# Patient Record
Sex: Male | Born: 1960 | Race: White | Hispanic: No | Smoking: Current every day smoker
Health system: Southern US, Community
[De-identification: ages and names within clinical notes are randomized; demographics above are authoritative.]

## PROBLEM LIST (undated history)

## (undated) DIAGNOSIS — T7840XA Allergy, unspecified, initial encounter: Secondary | ICD-10-CM

## (undated) DIAGNOSIS — K219 Gastro-esophageal reflux disease without esophagitis: Secondary | ICD-10-CM

## (undated) DIAGNOSIS — M87051 Idiopathic aseptic necrosis of right femur: Secondary | ICD-10-CM

## (undated) DIAGNOSIS — M1990 Unspecified osteoarthritis, unspecified site: Secondary | ICD-10-CM

## (undated) DIAGNOSIS — F419 Anxiety disorder, unspecified: Secondary | ICD-10-CM

## (undated) DIAGNOSIS — R0600 Dyspnea, unspecified: Secondary | ICD-10-CM

## (undated) DIAGNOSIS — F32A Depression, unspecified: Secondary | ICD-10-CM

## (undated) DIAGNOSIS — R51 Headache: Secondary | ICD-10-CM

## (undated) DIAGNOSIS — Z95810 Presence of automatic (implantable) cardiac defibrillator: Secondary | ICD-10-CM

## (undated) DIAGNOSIS — F1010 Alcohol abuse, uncomplicated: Secondary | ICD-10-CM

## (undated) DIAGNOSIS — E785 Hyperlipidemia, unspecified: Secondary | ICD-10-CM

## (undated) DIAGNOSIS — K7460 Unspecified cirrhosis of liver: Secondary | ICD-10-CM

## (undated) DIAGNOSIS — M109 Gout, unspecified: Secondary | ICD-10-CM

## (undated) DIAGNOSIS — I509 Heart failure, unspecified: Secondary | ICD-10-CM

## (undated) DIAGNOSIS — F329 Major depressive disorder, single episode, unspecified: Secondary | ICD-10-CM

## (undated) DIAGNOSIS — K2990 Gastroduodenitis, unspecified, without bleeding: Secondary | ICD-10-CM

## (undated) DIAGNOSIS — K759 Inflammatory liver disease, unspecified: Secondary | ICD-10-CM

## (undated) DIAGNOSIS — I10 Essential (primary) hypertension: Secondary | ICD-10-CM

## (undated) DIAGNOSIS — L988 Other specified disorders of the skin and subcutaneous tissue: Secondary | ICD-10-CM

## (undated) DIAGNOSIS — I8500 Esophageal varices without bleeding: Secondary | ICD-10-CM

## (undated) DIAGNOSIS — R519 Headache, unspecified: Secondary | ICD-10-CM

## (undated) HISTORY — DX: Depression, unspecified: F32.A

## (undated) HISTORY — DX: Hyperlipidemia, unspecified: E78.5

## (undated) HISTORY — DX: Gastro-esophageal reflux disease without esophagitis: K21.9

## (undated) HISTORY — DX: Allergy, unspecified, initial encounter: T78.40XA

## (undated) HISTORY — DX: Essential (primary) hypertension: I10

## (undated) HISTORY — DX: Major depressive disorder, single episode, unspecified: F32.9

---

## 1984-10-26 HISTORY — PX: TENDON REPAIR: SHX5111

## 1993-10-26 HISTORY — PX: TUMOR REMOVAL: SHX12

## 2013-10-26 HISTORY — PX: EP IMPLANTABLE DEVICE: SHX172B

## 2013-10-26 HISTORY — PX: OTHER SURGICAL HISTORY: SHX169

## 2015-08-27 HISTORY — PX: COLONOSCOPY: SHX174

## 2015-12-27 ENCOUNTER — Encounter: Payer: Self-pay | Admitting: Family Medicine

## 2015-12-27 ENCOUNTER — Ambulatory Visit (INDEPENDENT_AMBULATORY_CARE_PROVIDER_SITE_OTHER): Payer: Medicaid Other | Admitting: Family Medicine

## 2015-12-27 VITALS — BP 111/65 | HR 78 | Temp 97.3°F | Ht 68.0 in | Wt 277.0 lb

## 2015-12-27 DIAGNOSIS — I10 Essential (primary) hypertension: Secondary | ICD-10-CM | POA: Diagnosis not present

## 2015-12-27 DIAGNOSIS — K439 Ventral hernia without obstruction or gangrene: Secondary | ICD-10-CM

## 2015-12-27 DIAGNOSIS — E785 Hyperlipidemia, unspecified: Secondary | ICD-10-CM | POA: Diagnosis not present

## 2015-12-27 DIAGNOSIS — Z95810 Presence of automatic (implantable) cardiac defibrillator: Secondary | ICD-10-CM | POA: Diagnosis not present

## 2015-12-27 DIAGNOSIS — M87051 Idiopathic aseptic necrosis of right femur: Secondary | ICD-10-CM

## 2015-12-27 MED ORDER — LISINOPRIL 20 MG PO TABS: 20.0000 mg | ORAL_TABLET | Freq: Every day | ORAL | Status: DC

## 2015-12-27 NOTE — Progress Notes (Signed)
BP 111/65 mmHg  Pulse 78  Temp(Src) 97.3 F (36.3 C) (Oral)  Ht  (1.727 m)  Wt 277 lb (125.646 kg)  BMI 42.13 kg/m2   Subjective:    Patient ID: Russell Trujillo, male    DOB: [birth year], 55 y.o.   MRN: [id number]  HPI: Russell Trujillo is a 55 y.o. male presenting on 12/27/2015 for Establish Care   HPI Hypertension Patient presents today as a new patient to our practice. He just moved here about 2 weeks ago. He moved over from just [place] of [place]. His blood pressure today is 111/65. He is on lisinopril and metoprolol for this. Patient denies headaches, blurred vision, chest pains, shortness of breath, or weakness. Denies any side effects from medication and is content with current medication.   Hyperlipidemia Patient has a history of diagnosis of hyperlipidemia but has not on any medication for currently. There is concerns about possible liver issues he says just recently diagnosed and a CT scan may have shown some issues with his bladder and he thinks that's why they stopped his cholesterol medication. We will request the records of both and reevaluate at that time whether he needs repeat labs or medication.  AICD Patient has been told that he had an enlarged heart and possibility for a fatal arrhythmia from his previous cardiologist and they put in on AICD. He needs a new electrophysiologist and cardiologist here.  Ventral hernia Patient has a large ventral hernia about 4-5 cm in diameter just above his umbilicus. The hernia is reducible. He does feel like he has a significant amount of fluid in the abdomen which is creating a lot of pressure and outpouching of the hernia. We will send to Gen. surgery because of the hernia and the possibility of what he said on the CT scan that he may have some kind of fistula between his bladder and his rectum. We will request records of the CT scan in the meantime.  Relevant past medical, surgical, family and social history reviewed and updated as  indicated. Interim medical history since our last visit reviewed. Allergies and medications reviewed and updated.  Review of Systems  Constitutional: Negative for fever and chills.  HENT: Negative for ear discharge and ear pain.   Eyes: Negative for discharge and visual disturbance.  Respiratory: Negative for cough, shortness of breath and wheezing.   Cardiovascular: Negative for chest pain and leg swelling.  Gastrointestinal: Positive for abdominal distention. Negative for nausea, vomiting, abdominal pain, diarrhea, constipation and blood in stool.  Genitourinary: Negative for difficulty urinating.  Musculoskeletal: Negative for back pain and gait problem.  Skin: Negative for rash.  Neurological: Negative for dizziness, syncope, light-headedness and headaches.  All other systems reviewed and are negative.   Per HPI unless specifically indicated above  Social History   Social History  . Marital Status: Legally Separated    Spouse Name: N/A  . Number of Children: N/A  . Years of Education: N/A   Occupational History  . Not on file.   Social History Main Topics  . Smoking status: Current Every Day Smoker -- 1.50 packs/day for 40 years    Types: Cigarettes  . Smokeless tobacco: Never Used  . Alcohol Use: 2.4 oz/week    4 Shots of liquor per week  . Drug Use: No  . Sexual Activity: No   Other Topics Concern  . Not on file   Social History Narrative  . No narrative on file    Past Surgical  History  Procedure Laterality Date  . Tendon repair  1986    right hand  . Heart catherization  2015    no blockages  . Ep implantable device  2015  . 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 Cancer Mother   . Depression Mother   . Diabetes Mother   . Hearing loss Mother   . Hyperlipidemia Mother   . Hypertension Mother   . Mental illness Mother   . Vision loss Mother   . Alcohol abuse Father   . Arthritis Father   . Early  death Father   . Hyperlipidemia Father   . Hypertension Father   . Vision loss Father       Medication List       This list is accurate as of: 12/27/15  9:10 AM.  Always use your most recent med list.               aspirin 325 MG tablet  Take 325 mg by mouth daily.     diphenhydrAMINE 25 MG tablet  Commonly known as:  SOMINEX  Take 25 mg by mouth at bedtime as needed for sleep.     FLUoxetine 10 MG capsule  Commonly known as:  PROZAC  Take 10 mg by mouth daily.     ibuprofen 800 MG tablet  Commonly known as:  ADVIL,MOTRIN  Take 800 mg by mouth every 8 (eight) hours as needed.     lisinopril 20 MG tablet  Commonly known as:  PRINIVIL,ZESTRIL  Take 1 tablet (20 mg total) by mouth daily.     metoprolol succinate 25 MG 24 hr tablet  Commonly known as:  TOPROL-XL  Take 25 mg by mouth daily.           Objective:    BP 111/65 mmHg  Pulse 78  Temp(Src) 97.3 F (36.3 C) (Oral)  Ht  (1.727 m)  Wt 277 lb (125.646 kg)  BMI 42.13 kg/m2  Wt Readings from Last 3 Encounters:  12/27/15 277 lb (125.646 kg)    Physical Exam  Constitutional: He is oriented to person, place, and time. He appears well-developed and well-nourished. No distress.  Eyes: Conjunctivae and EOM are normal. Pupils are equal, round, and reactive to light. Right eye exhibits no discharge. No scleral icterus.  Neck: Neck supple. No thyromegaly present.  Cardiovascular: Normal rate, regular rhythm, normal heart sounds and intact distal pulses.   No murmur heard. Pulmonary/Chest: Effort normal and breath sounds normal. No respiratory distress. He has no wheezes.  Musculoskeletal: Normal range of motion. He exhibits no edema.  Lymphadenopathy:    He has no cervical adenopathy.  Neurological: He is alert and oriented to person, place, and time. Coordination normal.  Skin: Skin is warm and dry. No rash noted. He is not diaphoretic.  Psychiatric: He has a normal mood and affect. His behavior is normal.   Nursing note and vitals reviewed.   No results found for this or any previous visit.    Assessment & Plan:   Problem List Items Addressed This Visit      Cardiovascular and Mediastinum   Essential hypertension, benign   Relevant Medications   aspirin 325 MG tablet   metoprolol succinate (TOPROL-XL) 25 MG 24 hr tablet   lisinopril (PRINIVIL,ZESTRIL) 20 MG tablet     Other   Hyperlipidemia LDL goal <130   Relevant Medications  aspirin 325 MG tablet   metoprolol succinate (TOPROL-XL) 25 MG 24 hr tablet   lisinopril (PRINIVIL,ZESTRIL) 20 MG tablet   AICD (automatic cardioverter/defibrillator) present   Relevant Orders   Ambulatory referral to Cardiology   Ambulatory referral to Cardiac Electrophysiology   Ventral hernia   Relevant Orders   Ambulatory referral to General Surgery    Other Visit Diagnoses    Avascular necrosis of bone of hip, right (HCC)    -  Primary        Follow up plan: Return in about 4 weeks (around 01/24/2016), or if symptoms worsen or fail to improve, for htn recheck and labs.  Arville CareJoshua Jolyssa Oplinger, MD [facility] 12/27/2015, 9:10 AM

## 2016-01-02 ENCOUNTER — Other Ambulatory Visit: Payer: Self-pay | Admitting: Pharmacist

## 2016-01-02 MED ORDER — METOPROLOL SUCCINATE ER 25 MG PO TB24: 25.0000 mg | ORAL_TABLET | Freq: Every day | ORAL | Status: DC

## 2016-01-08 ENCOUNTER — Telehealth: Payer: Self-pay

## 2016-01-08 NOTE — Telephone Encounter (Signed)
x

## 2016-01-14 ENCOUNTER — Other Ambulatory Visit: Payer: Self-pay | Admitting: General Surgery

## 2016-01-14 ENCOUNTER — Telehealth: Payer: Self-pay | Admitting: Family Medicine

## 2016-01-14 DIAGNOSIS — K604 Rectal fistula: Secondary | ICD-10-CM

## 2016-01-16 NOTE — Telephone Encounter (Signed)
Attempted to call Dr. Derrell Lollingamirez, his office that he will give me a call back when he is back in. Russell CareJoshua Davinder Haff, MD [facility] 01/16/2016, 2:33 PM

## 2016-01-19 ENCOUNTER — Encounter (HOSPITAL_COMMUNITY): Payer: Self-pay | Admitting: Emergency Medicine

## 2016-01-19 ENCOUNTER — Emergency Department (HOSPITAL_COMMUNITY)
Admission: EM | Admit: 2016-01-19 | Discharge: 2016-01-19 | Disposition: A | Discharge status: Home / Self Care | Payer: Medicaid Other | Attending: Emergency Medicine | Admitting: Emergency Medicine

## 2016-01-19 DIAGNOSIS — F329 Major depressive disorder, single episode, unspecified: Secondary | ICD-10-CM | POA: Insufficient documentation

## 2016-01-19 DIAGNOSIS — Z791 Long term (current) use of non-steroidal anti-inflammatories (NSAID): Secondary | ICD-10-CM | POA: Diagnosis not present

## 2016-01-19 DIAGNOSIS — Z79899 Other long term (current) drug therapy: Secondary | ICD-10-CM | POA: Diagnosis not present

## 2016-01-19 DIAGNOSIS — I110 Hypertensive heart disease with heart failure: Secondary | ICD-10-CM | POA: Diagnosis not present

## 2016-01-19 DIAGNOSIS — F17210 Nicotine dependence, cigarettes, uncomplicated: Secondary | ICD-10-CM | POA: Insufficient documentation

## 2016-01-19 DIAGNOSIS — I509 Heart failure, unspecified: Secondary | ICD-10-CM | POA: Insufficient documentation

## 2016-01-19 DIAGNOSIS — Z5321 Procedure and treatment not carried out due to patient leaving prior to being seen by health care provider: Secondary | ICD-10-CM | POA: Insufficient documentation

## 2016-01-19 DIAGNOSIS — Z7982 Long term (current) use of aspirin: Secondary | ICD-10-CM | POA: Diagnosis not present

## 2016-01-19 DIAGNOSIS — M7989 Other specified soft tissue disorders: Secondary | ICD-10-CM | POA: Insufficient documentation

## 2016-01-19 NOTE — ED Notes (Signed)
Dr. Patria Maneampos in to assess patient, patient not in room, unable to locate patient.

## 2016-01-19 NOTE — ED Notes (Signed)
Pt states he has been having an increase in leg swelling over the past few days.

## 2016-01-24 ENCOUNTER — Ambulatory Visit
Admission: RE | Admit: 2016-01-24 | Discharge: 2016-01-24 | Disposition: A | Discharge status: Home / Self Care | Payer: Medicaid Other | Source: Ambulatory Visit | Attending: General Surgery | Admitting: General Surgery

## 2016-01-24 DIAGNOSIS — K604 Rectal fistula: Secondary | ICD-10-CM

## 2016-01-24 MED ORDER — IOPAMIDOL (ISOVUE-300) INJECTION 61%
125.0000 mL | Freq: Once | INTRAVENOUS | Status: AC | PRN
  Administered 2016-01-24: 125 mL via INTRAVENOUS

## 2016-01-28 ENCOUNTER — Ambulatory Visit (HOSPITAL_COMMUNITY)
Admission: RE | Admit: 2016-01-28 | Discharge: 2016-01-28 | Disposition: A | Discharge status: Home / Self Care | Payer: Medicaid Other | Source: Ambulatory Visit | Attending: Family Medicine | Admitting: Family Medicine

## 2016-01-28 ENCOUNTER — Encounter: Payer: Self-pay | Admitting: Family Medicine

## 2016-01-28 ENCOUNTER — Encounter (HOSPITAL_COMMUNITY): Payer: Self-pay

## 2016-01-28 ENCOUNTER — Ambulatory Visit (INDEPENDENT_AMBULATORY_CARE_PROVIDER_SITE_OTHER): Payer: Medicaid Other | Admitting: Family Medicine

## 2016-01-28 VITALS — BP 113/74 | HR 80 | Temp 96.8°F | Ht 68.0 in | Wt 310.2 lb

## 2016-01-28 DIAGNOSIS — R188 Other ascites: Secondary | ICD-10-CM

## 2016-01-28 DIAGNOSIS — I10 Essential (primary) hypertension: Secondary | ICD-10-CM

## 2016-01-28 DIAGNOSIS — N321 Vesicointestinal fistula: Secondary | ICD-10-CM

## 2016-01-28 DIAGNOSIS — E785 Hyperlipidemia, unspecified: Secondary | ICD-10-CM | POA: Diagnosis not present

## 2016-01-28 DIAGNOSIS — L98491 Non-pressure chronic ulcer of skin of other sites limited to breakdown of skin: Secondary | ICD-10-CM | POA: Diagnosis not present

## 2016-01-28 DIAGNOSIS — K7031 Alcoholic cirrhosis of liver with ascites: Secondary | ICD-10-CM

## 2016-01-28 DIAGNOSIS — K7460 Unspecified cirrhosis of liver: Secondary | ICD-10-CM | POA: Insufficient documentation

## 2016-01-28 DIAGNOSIS — K5732 Diverticulitis of large intestine without perforation or abscess without bleeding: Secondary | ICD-10-CM

## 2016-01-28 MED ORDER — FUROSEMIDE 40 MG PO TABS: 40.0000 mg | ORAL_TABLET | Freq: Every day | ORAL | Status: DC

## 2016-01-28 MED ORDER — METRONIDAZOLE 500 MG PO TABS
500.0000 mg | ORAL_TABLET | Freq: Two times a day (BID) | ORAL | Status: DC
Start: 2016-01-28 — End: 2016-03-02

## 2016-01-28 MED ORDER — CIPROFLOXACIN HCL 500 MG PO TABS: 500.0000 mg | ORAL_TABLET | Freq: Two times a day (BID) | ORAL | Status: DC

## 2016-01-28 NOTE — Procedures (Signed)
PreOperative Dx: Cirrhosis, ascites Postoperative Dx: Cirrhosis, ascites Procedure:   US guided paracentesis Radiologist:  Tyron RussellBoles Anesthesia:  10 ml of1% lidocaine Specimen:  4L ml of amber ascitic fluid EBL:   < 1 ml Complications: None

## 2016-01-28 NOTE — Progress Notes (Signed)
Paracentesis complete no signs of distress. 4000 ml amber colored ascites removed.  

## 2016-01-28 NOTE — Progress Notes (Signed)
BP 113/74 mmHg  Pulse 80  Temp(Src) 96.8 F (36 C) (Oral)  Ht 5' 8"  (1.727 m)  Wt 310 lb 3.2 oz (140.706 kg)  BMI 47.18 kg/m2   Subjective:    Patient ID: Russell Trujillo, male    DOB: 10-22-1961, 55 y.o.   MRN: [id number]  HPI: Russell Trujillo is a 55 y.o. male presenting on 01/28/2016 for Hypertension and Weight Gain   HPI Hypertension recheck Patient comes in today for a hypertension recheck. His blood pressure today is 113/74. He is currently taking lisinopril. Patient denies headaches, blurred vision, chest pains, shortness of breath, or weakness. Denies any side effects from medication and is content with current medication.   Swelling and ascites of the abdomen and lower extremities Patient has been diagnosed with swelling and ascites of the abdomen and lower legs. He has a known hernia on his anterior abdomen in the ventral region and because of the swelling and ascites that has pressed it out and cause some irritation and skin breakdown in that area. 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 They did a CT scan and confirmed that also showed some possible diverticulitis and significant ascites.  Hyperlipidemia recheck Patient has been intolerant of statins previously but is due for a cholesterol recheck today. We will see where he is up.   Relevant past medical, surgical, family and social history reviewed and updated as indicated. Interim medical history since our last visit reviewed. Allergies and medications reviewed and updated.  Review of Systems  Constitutional: Negative for fever.  HENT: Negative for congestion, ear discharge and ear pain.   Eyes: Negative for discharge and visual disturbance.  Respiratory: Negative for cough, chest tightness, shortness of breath and wheezing.   Cardiovascular: Positive for leg swelling. Negative for chest pain.  Gastrointestinal: Negative for abdominal  pain, diarrhea and constipation.  Genitourinary: Negative for difficulty urinating.  Musculoskeletal: Negative for back pain and gait problem.  Skin: Positive for wound. Negative for rash.  Neurological: Negative for dizziness, syncope, light-headedness and headaches.  All other systems reviewed and are negative.   Per HPI unless specifically indicated above     Medication List       This list is accurate as of: 01/28/16  9:19 AM.  Always use your most recent med list.               aspirin 325 MG tablet  Take 325 mg by mouth daily.     ciprofloxacin 500 MG tablet  Commonly known as:  CIPRO  Take 1 tablet (500 mg total) by mouth 2 (two) times daily.     diphenhydrAMINE 25 MG tablet  Commonly known as:  SOMINEX  Take 25 mg by mouth at bedtime as needed for sleep.     FLUoxetine 10 MG capsule  Commonly known as:  PROZAC  Take 10 mg by mouth daily.     furosemide 40 MG tablet  Commonly known as:  LASIX  Take 1 tablet (40 mg total) by mouth daily.     ibuprofen 800 MG tablet  Commonly known as:  ADVIL,MOTRIN  Take 800 mg by mouth every 8 (eight) hours as needed for moderate pain.     lisinopril 20 MG tablet  Commonly known as:  PRINIVIL,ZESTRIL  Take 1 tablet (20 mg total) by mouth daily.     metoprolol succinate 25 MG 24 hr tablet  Commonly  known as:  TOPROL-XL  Take 1 tablet (25 mg total) by mouth daily.     metroNIDAZOLE 500 MG tablet  Commonly known as:  FLAGYL  Take 1 tablet (500 mg total) by mouth 2 (two) times daily.           Objective:    BP 113/74 mmHg  Pulse 80  Temp(Src) 96.8 F (36 C) (Oral)  Ht 5' 8"  (1.727 m)  Wt 310 lb 3.2 oz (140.706 kg)  BMI 47.18 kg/m2  Wt Readings from Last 3 Encounters:  01/28/16 310 lb 3.2 oz (140.706 kg)  01/19/16 299 lb 12.8 oz (135.988 kg)  12/27/15 277 lb (125.646 kg)    Physical Exam  Constitutional: He is oriented to person, place, and time. He appears well-developed and well-nourished. No distress.    Eyes: Conjunctivae and EOM are normal. Pupils are equal, round, and reactive to light. Right eye exhibits no discharge. No scleral icterus.  Neck: Neck supple. No thyromegaly present.  Cardiovascular: Normal rate, regular rhythm, normal heart sounds and intact distal pulses.   No murmur heard. Pulmonary/Chest: Effort normal and breath sounds normal. No respiratory distress. He has no wheezes. He has no rales.  Abdominal: Soft. Bowel sounds are normal. He exhibits distension, fluid wave and ascites. He exhibits no pulsatile midline mass. There is no tenderness. There is no rebound, no guarding and no CVA tenderness. A hernia is present. Hernia confirmed positive in the ventral area (Ventral hernia that is reducible and large, about 4 cm wide).  Musculoskeletal: Normal range of motion. He exhibits no edema.  Lymphadenopathy:    He has no cervical adenopathy.  Neurological: He is alert and oriented to person, place, and time. Coordination normal.  Skin: Skin is warm and dry. Lesion (1.5 cm ulceration over skin on top of ventral hernia. No signs of infection. Drainage is clear to straw-colored. Stage II) noted. No rash noted. He is not diaphoretic.  Psychiatric: He has a normal mood and affect. His behavior is normal.  Vitals reviewed.   No results found for this or any previous visit.    Assessment & Plan:   Problem List Items Addressed This Visit      Cardiovascular and Mediastinum   Essential hypertension, benign - Primary   Relevant Medications   furosemide (LASIX) 40 MG tablet   Other Relevant Orders   CMP14+EGFR (Completed)     Digestive   Hepatic cirrhosis (HCC)   Relevant Medications   furosemide (LASIX) 40 MG tablet   Other Relevant Orders   Ambulatory referral to Gastroenterology   US Paracentesis (Completed)   CMP14+EGFR (Completed)   Vesicorectal fistula   Relevant Medications   ciprofloxacin (CIPRO) 500 MG tablet   metroNIDAZOLE (FLAGYL) 500 MG tablet     Other    Hyperlipidemia LDL goal <130   Relevant Medications   furosemide (LASIX) 40 MG tablet    Other Visit Diagnoses    Ascites        Relevant Medications    furosemide (LASIX) 40 MG tablet    Other Relevant Orders    US Paracentesis (Completed)    Diverticulitis of colon        Relevant Medications    ciprofloxacin (CIPRO) 500 MG tablet    metroNIDAZOLE (FLAGYL) 500 MG tablet    Abdominal wall skin ulcer, limited to breakdown of skin ([place])            Follow up plan: Return in about 1 week (around 02/04/2016), or if  symptoms worsen or fail to improve, for wound care.  Counseling provided for all of the vaccine components Orders Placed This Encounter  Procedures  . US Paracentesis  . CMP14+EGFR  . Ambulatory referral to Gastroenterology    Caryl Pina, MD [facility] 01/28/2016, 9:19 AM

## 2016-01-29 LAB — CMP14+EGFR
A/G RATIO: 1.1 — AB (ref 1.2–2.2)
ALK PHOS: 105 IU/L (ref 39–117)
ALT: 9 IU/L (ref 0–44)
AST: 21 IU/L (ref 0–40)
Albumin: 3.7 g/dL (ref 3.5–5.5)
BILIRUBIN TOTAL: 0.4 mg/dL (ref 0.0–1.2)
BUN/Creatinine Ratio: 13 (ref 9–20)
BUN: 16 mg/dL (ref 6–24)
CHLORIDE: 101 mmol/L (ref 96–106)
CO2: 20 mmol/L (ref 18–29)
Calcium: 8.4 mg/dL — ABNORMAL LOW (ref 8.7–10.2)
Creatinine, Ser: 1.19 mg/dL (ref 0.76–1.27)
GFR calc non Af Amer: 68 mL/min/{1.73_m2} (ref >59)
GFR, EST AFRICAN AMERICAN: 79 mL/min/{1.73_m2} (ref >59)
GLUCOSE: 85 mg/dL (ref 65–99)
Globulin, Total: 3.4 g/dL (ref 1.5–4.5)
POTASSIUM: 5.1 mmol/L (ref 3.5–5.2)
Sodium: 137 mmol/L (ref 134–144)
TOTAL PROTEIN: 7.1 g/dL (ref 6.0–8.5)

## 2016-01-31 ENCOUNTER — Telehealth: Payer: Self-pay | Admitting: Family Medicine

## 2016-01-31 DIAGNOSIS — K7031 Alcoholic cirrhosis of liver with ascites: Secondary | ICD-10-CM

## 2016-02-02 NOTE — Telephone Encounter (Signed)
Okay by me. Go ahead and put in the order for another ultrasound paracentesis

## 2016-02-03 ENCOUNTER — Encounter: Payer: Self-pay | Admitting: Gastroenterology

## 2016-02-03 NOTE — Telephone Encounter (Signed)
Patient aware , referral done for US AT [facility].

## 2016-02-05 ENCOUNTER — Ambulatory Visit (HOSPITAL_COMMUNITY)
Admission: RE | Admit: 2016-02-05 | Discharge: 2016-02-05 | Disposition: A | Discharge status: Home / Self Care | Payer: Medicaid Other | Source: Ambulatory Visit | Attending: Family Medicine | Admitting: Family Medicine

## 2016-02-05 DIAGNOSIS — K7031 Alcoholic cirrhosis of liver with ascites: Secondary | ICD-10-CM | POA: Diagnosis present

## 2016-02-05 MED ORDER — ALBUMIN HUMAN 25 % IV SOLN
50.0000 g | Freq: Once | INTRAVENOUS | Status: AC
  Administered 2016-02-05: 50 g via INTRAVENOUS

## 2016-02-05 MED ORDER — ALBUMIN HUMAN 25 % IV SOLN
INTRAVENOUS | Status: AC
  Administered 2016-02-05: 50 g via INTRAVENOUS
  Filled 2016-02-05: qty 200

## 2016-02-05 NOTE — Progress Notes (Signed)
Paracentesis complete no signs of distress. 15500 ml amber colored ascites removed.

## 2016-02-12 ENCOUNTER — Encounter: Payer: Self-pay | Admitting: Family Medicine

## 2016-02-13 ENCOUNTER — Encounter: Payer: Self-pay | Admitting: Family Medicine

## 2016-02-14 ENCOUNTER — Encounter: Payer: Self-pay | Admitting: Family Medicine

## 2016-02-20 ENCOUNTER — Telehealth: Payer: Self-pay | Admitting: *Deleted

## 2016-02-20 MED ORDER — FENOFIBRATE 145 MG PO TABS: 145.0000 mg | ORAL_TABLET | Freq: Every day | ORAL | Status: AC

## 2016-02-20 NOTE — Telephone Encounter (Signed)
-----   Message from Nils PyleJoshua A Dettinger, MD sent at 02/20/2016  8:46 AM EDT ----- Patient had very elevated triglycerides in the 700 range, it should be less than 160. He needs to be started on a fenofibrate either 135 are 145 g, which ever his insurance will cover. Give him 6 months worth and have him recheck these numbers in 3 months.

## 2016-02-26 ENCOUNTER — Encounter: Payer: Self-pay | Admitting: *Deleted

## 2016-02-27 ENCOUNTER — Ambulatory Visit: Payer: Self-pay | Admitting: Internal Medicine

## 2016-03-02 ENCOUNTER — Telehealth: Payer: Self-pay | Admitting: Gastroenterology

## 2016-03-02 ENCOUNTER — Ambulatory Visit (INDEPENDENT_AMBULATORY_CARE_PROVIDER_SITE_OTHER): Payer: Medicaid Other | Admitting: Gastroenterology

## 2016-03-02 ENCOUNTER — Other Ambulatory Visit: Payer: Self-pay

## 2016-03-02 ENCOUNTER — Encounter: Payer: Self-pay | Admitting: Gastroenterology

## 2016-03-02 VITALS — BP 123/72 | HR 75 | Temp 96.9°F | Ht 68.0 in | Wt 271.6 lb

## 2016-03-02 DIAGNOSIS — R600 Localized edema: Secondary | ICD-10-CM | POA: Diagnosis not present

## 2016-03-02 DIAGNOSIS — K7031 Alcoholic cirrhosis of liver with ascites: Secondary | ICD-10-CM | POA: Diagnosis not present

## 2016-03-02 MED ORDER — SPIRONOLACTONE 50 MG PO TABS: 50.0000 mg | ORAL_TABLET | Freq: Every day | ORAL | Status: DC

## 2016-03-02 NOTE — Patient Instructions (Addendum)
I have added a new medication called Aldactone (spironolactone) to your diuretic regimen. This tends to "hold on" to potassium, and it helps to take this with your lasix each morning. However, one of your other medications, lisinopril, could potentially raise your potassium level as well. So, I've started you on a low dose of aldactone. I want to have you get your blood work done THIS Friday instead of next week. We need to make sure your electrolytes and kidneys are doing well with the new medication.  Please have blood work done on Friday.   I have ordered a special ultrasound of your leg.   We have scheduled you for an upper endoscopy in the near future to look for varices.   We will see you in 4-6 weeks.   Continue to stay completely away from alcohol.       Low-Sodium Eating Plan Sodium raises blood pressure and causes water to be held in the body. Getting less sodium from food will help lower your blood pressure, reduce any swelling, and protect your heart, liver, and kidneys. We get sodium by adding salt (sodium chloride) to food. Most of our sodium comes from canned, boxed, and frozen foods. Restaurant foods, fast foods, and pizza are also very high in sodium. Even if you take medicine to lower your blood pressure or to reduce fluid in your body, getting less sodium from your food is important. WHAT IS MY PLAN? Most people should limit their sodium intake to 2,300 mg a day. Your health care provider recommends that you limit your sodium intake to  2 grams__ a day.  WHAT DO I NEED TO KNOW ABOUT THIS EATING PLAN? For the low-sodium eating plan, you will follow these general guidelines:  Choose foods with a % Daily Value for sodium of less than 5% (as listed on the food label).   Use salt-free seasonings or herbs instead of table salt or sea salt.   Check with your health care provider or pharmacist before using salt substitutes.   Eat fresh foods.  Eat more vegetables and  fruits.  Limit canned vegetables. If you do use them, rinse them well to decrease the sodium.   Limit cheese to 1 oz (28 g) per day.   Eat lower-sodium products, often labeled as "lower sodium" or "no salt added."  Avoid foods that contain monosodium glutamate (MSG). MSG is sometimes added to [place] food and some canned foods.  Check food labels (Nutrition Facts labels) on foods to learn how much sodium is in one serving.  Eat more home-cooked food and less restaurant, buffet, and fast food.  When eating at a restaurant, ask that your food be prepared with less salt, or no salt if possible.  HOW DO I READ FOOD LABELS FOR SODIUM INFORMATION? The Nutrition Facts label lists the amount of sodium in one serving of the food. If you eat more than one serving, you must multiply the listed amount of sodium by the number of servings. Food labels may also identify foods as:  Sodium free--Less than 5 mg in a serving.  Very low sodium--35 mg or less in a serving.  Low sodium--140 mg or less in a serving.  Light in sodium--50% less sodium in a serving. For example, if a food that usually has 300 mg of sodium is changed to become light in sodium, it will have 150 mg of sodium.  Reduced sodium--25% less sodium in a serving. For example, if a food that usually has  400 mg of sodium is changed to reduced sodium, it will have 300 mg of sodium. WHAT FOODS CAN I EAT? Grains Low-sodium cereals, including oats, puffed wheat and rice, and shredded wheat cereals. Low-sodium crackers. Unsalted rice and pasta. Lower-sodium bread.  Vegetables Frozen or fresh vegetables. Low-sodium or reduced-sodium canned vegetables. Low-sodium or reduced-sodium tomato sauce and paste. Low-sodium or reduced-sodium tomato and vegetable juices.  Fruits Fresh, frozen, and canned fruit. Fruit juice.  Meat and Other Protein Products Low-sodium canned tuna and salmon. Fresh or frozen meat, poultry, seafood, and fish.  Lamb. Unsalted nuts. Dried beans, peas, and lentils without added salt. Unsalted canned beans. Homemade soups without salt. Eggs.  Dairy Milk. Soy milk. Ricotta cheese. Low-sodium or reduced-sodium cheeses. Yogurt.  Condiments Fresh and dried herbs and spices. Salt-free seasonings. Onion and garlic powders. Low-sodium varieties of mustard and ketchup. Fresh or refrigerated horseradish. Lemon juice.  Fats and Oils Reduced-sodium salad dressings. Unsalted butter.  Other Unsalted popcorn and pretzels.  The items listed above may not be a complete list of recommended foods or beverages. Contact your dietitian for more options. WHAT FOODS ARE NOT RECOMMENDED? Grains Instant hot cereals. Bread stuffing, pancake, and biscuit mixes. Croutons. Seasoned rice or pasta mixes. Noodle soup cups. Boxed or frozen macaroni and cheese. Self-rising flour. Regular salted crackers. Vegetables Regular canned vegetables. Regular canned tomato sauce and paste. Regular tomato and vegetable juices. Frozen vegetables in sauces. Salted [place] fries. Olives. Rosita Fire. Relishes. Sauerkraut. Salsa. Meat and Other Protein Products Salted, canned, smoked, spiced, or pickled meats, seafood, or fish. Bacon, ham, sausage, hot dogs, corned beef, chipped beef, and packaged luncheon meats. Salt pork. Jerky. Pickled herring. Anchovies, regular canned tuna, and sardines. Salted nuts. Dairy Processed cheese and cheese spreads. Cheese curds. Blue cheese and cottage cheese. Buttermilk.  Condiments Onion and garlic salt, seasoned salt, table salt, and sea salt. Canned and packaged gravies. Worcestershire sauce. Tartar sauce. Barbecue sauce. Teriyaki sauce. Soy sauce, including reduced sodium. Steak sauce. Fish sauce. Oyster sauce. Cocktail sauce. Horseradish that you find on the shelf. Regular ketchup and mustard. Meat flavorings and tenderizers. Bouillon cubes. Hot sauce. Tabasco sauce. Marinades. Taco seasonings. Relishes. Fats  and Oils Regular salad dressings. Salted butter. Margarine. Ghee. Bacon fat.  Other Potato and tortilla chips. Corn chips and puffs. Salted popcorn and pretzels. Canned or dried soups. Pizza. Frozen entrees and pot pies.  The items listed above may not be a complete list of foods and beverages to avoid. Contact your dietitian for more information.   This information is not intended to replace advice given to you by your health care provider. Make sure you discuss any questions you have with your health care provider.   Document Released: 04/03/2002 Document Revised: 11/02/2014 Document Reviewed: 08/16/2013 Elsevier Interactive Patient Education 2016 [employer] Low-Sodium Eating Plan Sodium raises blood pressure and causes water to be held in the body. Getting less sodium from food will help lower your blood pressure, reduce any swelling, and protect your heart, liver, and kidneys. We get sodium by adding salt (sodium chloride) to food. Most of our sodium comes from canned, boxed, and frozen foods. Restaurant foods, fast foods, and pizza are also very high in sodium. Even if you take medicine to lower your blood pressure or to reduce fluid in your body, getting less sodium from your food is important. WHAT IS MY PLAN? Most people should limit their sodium intake to 2,300 mg a day. Your health care provider recommends that you limit your sodium  intake to __________ a day.  WHAT DO I NEED TO KNOW ABOUT THIS EATING PLAN? For the low-sodium eating plan, you will follow these general guidelines:  Choose foods with a % Daily Value for sodium of less than 5% (as listed on the food label).   Use salt-free seasonings or herbs instead of table salt or sea salt.   Check with your health care provider or pharmacist before using salt substitutes.   Eat fresh foods.  Eat more vegetables and fruits.  Limit canned vegetables. If you do use them, rinse them well to decrease the sodium.   Limit  cheese to 1 oz (28 g) per day.   Eat lower-sodium products, often labeled as "lower sodium" or "no salt added."  Avoid foods that contain monosodium glutamate (MSG). MSG is sometimes added to [place] food and some canned foods.  Check food labels (Nutrition Facts labels) on foods to learn how much sodium is in one serving.  Eat more home-cooked food and less restaurant, buffet, and fast food.  When eating at a restaurant, ask that your food be prepared with less salt, or no salt if possible.  HOW DO I READ FOOD LABELS FOR SODIUM INFORMATION? The Nutrition Facts label lists the amount of sodium in one serving of the food. If you eat more than one serving, you must multiply the listed amount of sodium by the number of servings. Food labels may also identify foods as:  Sodium free--Less than 5 mg in a serving.  Very low sodium--35 mg or less in a serving.  Low sodium--140 mg or less in a serving.  Light in sodium--50% less sodium in a serving. For example, if a food that usually has 300 mg of sodium is changed to become light in sodium, it will have 150 mg of sodium.  Reduced sodium--25% less sodium in a serving. For example, if a food that usually has 400 mg of sodium is changed to reduced sodium, it will have 300 mg of sodium. WHAT FOODS CAN I EAT? Grains Low-sodium cereals, including oats, puffed wheat and rice, and shredded wheat cereals. Low-sodium crackers. Unsalted rice and pasta. Lower-sodium bread.  Vegetables Frozen or fresh vegetables. Low-sodium or reduced-sodium canned vegetables. Low-sodium or reduced-sodium tomato sauce and paste. Low-sodium or reduced-sodium tomato and vegetable juices.  Fruits Fresh, frozen, and canned fruit. Fruit juice.  Meat and Other Protein Products Low-sodium canned tuna and salmon. Fresh or frozen meat, poultry, seafood, and fish. Lamb. Unsalted nuts. Dried beans, peas, and lentils without added salt. Unsalted canned beans. Homemade  soups without salt. Eggs.  Dairy Milk. Soy milk. Ricotta cheese. Low-sodium or reduced-sodium cheeses. Yogurt.  Condiments Fresh and dried herbs and spices. Salt-free seasonings. Onion and garlic powders. Low-sodium varieties of mustard and ketchup. Fresh or refrigerated horseradish. Lemon juice.  Fats and Oils Reduced-sodium salad dressings. Unsalted butter.  Other Unsalted popcorn and pretzels.  The items listed above may not be a complete list of recommended foods or beverages. Contact your dietitian for more options. WHAT FOODS ARE NOT RECOMMENDED? Grains Instant hot cereals. Bread stuffing, pancake, and biscuit mixes. Croutons. Seasoned rice or pasta mixes. Noodle soup cups. Boxed or frozen macaroni and cheese. Self-rising flour. Regular salted crackers. Vegetables Regular canned vegetables. Regular canned tomato sauce and paste. Regular tomato and vegetable juices. Frozen vegetables in sauces. Salted [place] fries. Olives. Rosita Fire. Relishes. Sauerkraut. Salsa. Meat and Other Protein Products Salted, canned, smoked, spiced, or pickled meats, seafood, or fish. Bacon, ham, sausage, hot dogs,  corned beef, chipped beef, and packaged luncheon meats. Salt pork. Jerky. Pickled herring. Anchovies, regular canned tuna, and sardines. Salted nuts. Dairy Processed cheese and cheese spreads. Cheese curds. Blue cheese and cottage cheese. Buttermilk.  Condiments Onion and garlic salt, seasoned salt, table salt, and sea salt. Canned and packaged gravies. Worcestershire sauce. Tartar sauce. Barbecue sauce. Teriyaki sauce. Soy sauce, including reduced sodium. Steak sauce. Fish sauce. Oyster sauce. Cocktail sauce. Horseradish that you find on the shelf. Regular ketchup and mustard. Meat flavorings and tenderizers. Bouillon cubes. Hot sauce. Tabasco sauce. Marinades. Taco seasonings. Relishes. Fats and Oils Regular salad dressings. Salted butter. Margarine. Ghee. Bacon fat.  Other Potato and  tortilla chips. Corn chips and puffs. Salted popcorn and pretzels. Canned or dried soups. Pizza. Frozen entrees and pot pies.  The items listed above may not be a complete list of foods and beverages to avoid. Contact your dietitian for more information.   This information is not intended to replace advice given to you by your health care provider. Make sure you discuss any questions you have with your health care provider.   Document Released: 04/03/2002 Document Revised: 11/02/2014 Document Reviewed: 08/16/2013 Elsevier Interactive Patient Education 2016 [employer] Cirrhosis Cirrhosis is long-term (chronic) liver injury. The liver is your largest internal organ, and it performs many functions. The liver converts food into energy, removes toxic material from your blood, makes important proteins, and absorbs necessary vitamins from your diet. If you have cirrhosis, it means many of your healthy liver cells have been replaced by scar tissue. This prevents blood from flowing through your liver, which makes it difficult for your liver to function. This scarring is not reversible, but treatment can prevent it from getting worse.  CAUSES  Hepatitis C and long-term alcohol abuse are the most common causes of cirrhosis. Other causes include:  Nonalcoholic fatty liver disease.  Hepatitis B infection.  Autoimmune hepatitis.  Diseases that cause blockage of ducts inside the liver.  Inherited liver diseases.  Reactions to certain long-term medicines.  Parasitic infections.  Long-term exposure to certain toxins. RISK FACTORS You may have a higher risk of cirrhosis if you:  Have certain hepatitis viruses.  Abuse alcohol, especially if you are male.  Are overweight.  Share needles.  Have unprotected sex with someone who has hepatitis. SYMPTOMS  You may not have any signs and symptoms at first. Symptoms may not develop until the damage to your liver starts to get worse. Signs and  symptoms of cirrhosis may include:   Tenderness in the right-upper part of your abdomen.  Weakness and tiredness (fatigue).  Loss of appetite.  Nausea.  Weight loss and muscle loss.  Itchiness.  Yellow skin and eyes (jaundice).  Buildup of fluid in the abdomen (ascites).  Swelling of the feet and ankles (edema).  Appearance of tiny blood vessels under the skin.  Mental confusion.  Easy bruising and bleeding. DIAGNOSIS  Your health care provider may suspect cirrhosis based on your symptoms and medical history, especially if you have other medical conditions or a history of alcohol abuse. Your health care provider will do a physical exam to feel your liver and check for signs of cirrhosis. Your health care provider may perform other tests, including:   Blood tests to check:   Whether you have hepatitis B or C.   Kidney function.  Liver function.  Imaging tests such as:  MRI or CT scan to look for changes seen in advanced cirrhosis.  Ultrasound to see if  normal liver tissue is being replaced by scar tissue.  A procedure using a long needle to take a sample of liver tissue (biopsy) for examination under a microscope. Liver biopsy can confirm the diagnosis of cirrhosis.  TREATMENT  Treatment depends on how damaged your liver is and what caused the damage. Treatment may include treating cirrhosis symptoms or treating the underlying causes of the condition to try to slow the progression of the damage. Treatment may include:  Making lifestyle changes, such as:   Eating a healthy diet.  Restricting salt intake.  Maintaining a healthy weight.   Not abusing drugs or alcohol.  Taking medicines to:  Treat liver infections or other infections.  Control itching.  Reduce fluid buildup.  Reduce certain blood toxins.  Reduce risk of bleeding from enlarged blood vessels in the stomach or esophagus (varices).  If varices are causing bleeding problems, you may  need treatment with a procedure that ties up the vessels causing them to fall off (band ligation).  If cirrhosis is causing your liver to fail, your health care provider may recommend a liver transplant.  Other treatments may be recommended depending on any complications of cirrhosis, such as liver-related kidney failure (hepatorenal syndrome). HOME CARE INSTRUCTIONS   Take medicines only as directed by your health care provider. Do not use drugs that are toxic to your liver. Ask your health care provider before taking any new medicines, including over-the-counter medicines.   Rest as needed.  Eat a well-balanced diet. Ask your health care provider or dietitian for more information.   You may have to follow a low-salt diet or restrict your water intake as directed.  Do not drink alcohol. This is especially important if you are taking acetaminophen.  Keep all follow-up visits as directed by your health care provider. This is important. SEEK MEDICAL CARE IF:  You have fatigue or weakness that is getting worse.  You develop swelling of the hands, feet, legs, or face.  You have a fever.  You develop loss of appetite.  You have nausea or vomiting.  You develop jaundice.  You develop easy bruising or bleeding. SEEK IMMEDIATE MEDICAL CARE IF:  You vomit bright red blood or a material that looks like coffee grounds.  You have blood in your stools.  Your stools appear black and tarry.  You become confused.  You have chest pain or trouble breathing.   This information is not intended to replace advice given to you by your health care provider. Make sure you discuss any questions you have with your health care provider.   Document Released: 10/12/2005 Document Revised: 11/02/2014 Document Reviewed: 06/20/2014 Elsevier Interactive Patient Education [employer]

## 2016-03-02 NOTE — Assessment & Plan Note (Signed)
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 Imaging on file from March 2017 with known colovesical fistula, mild sigmoid diverticulitis, cirrhosis without evidence of neoplasm. He has clinically improved since this CT and will be seeing a surgeon in the near future regarding the colovesical fistula and large ventral hernia. Discussed cirrhosis care and discussed absolute cessation of ETOH if desirous of liver transplantation in the future. He appears eager to follow recommendations at this time. Colonoscopy up-to-date, with need for surveillance in Nov 2019. Needs initial EGD for variceal screening.  1. Proceed with upper endoscopy in the near future with Dr. Darrick PennaFields. The risks, benefits, and alternatives have been discussed in detail with patient. They have stated understanding and desire to proceed. PROPOFOL due to history of ETOH use 2. Continue Lasix 40 mg once daily, add low dose aldactone 50 mg daily along with lasix. He is on lisinopril, so we will need to monitor his labs closely. Recheck on Friday.  3. Strict low sodium diet. Discussed in detail (currently non-compliant) 4. Check Hep C antibody, Hep B surface antigen, CBC, CMP, INR on Friday 5. Next imaging in Sept 2017 6. 6 week return: will give vaccination prescription at that time. Needs Hep A/B if not already received 7. ETOH cessation

## 2016-03-02 NOTE — Assessment & Plan Note (Signed)
More pronounced than right, new finding per patient. Duplex ultrasound ordered.

## 2016-03-02 NOTE — Progress Notes (Signed)
Primary Care Physician:  Nils Pyle, MD Primary Gastroenterologist:  Dr. Darrick Penna   Chief Complaint  Patient presents with  . Cirrhosis    HPI:   Russell Trujillo is a 55 y.o. male presenting today at the request of his PCP secondary to cirrhosis. He had a CT in March 2017 noting mild sigmoid diverticulitis with associated colovesical fistula. No evidence of abscess.  Similar findings noted Jan 2016 from outside CT in [place]. He was referred to General Surgery. He has required 2 paracenteses in April, removing 15 liters February 05, 2016. He is here to establish care.   Colonoscopy Nov 2016 by Dr. Rossie Muskrat with [facility] noting two 6-8 mm polyps (tubular adenomas), mild diverticulosis.  Will be seeing a surgeon in [facility] next week due to chronic colovesical fistula. Urine is cloudy. Lower abdominal discomfort. When he is gassy, has pain in his bladder. Noted new onset of fluid starting in Nov. Umbilical hernia protruding. Stays with his mom to help her, as she has cancer. Recently divorced[place]. Has a history of heavy ETOH abuse. Takes Lasix only every other day. Trying to stay off of ETOH completely but had slipped a few times. Has been adding salt to food but now realizes this is not helpful. Urinary symptoms are most troublesome. Was having some issues with reflux but improved after fluid removed. No confusion. He has done a lot of reading about cirrhosis and is quite aware of the complications with cirrhosis. No prior EGD. States he wants to do "whatever it takes" to get better. He states he was told he had "hepatitis" a long time ago when he had blood work for college.   Past Medical History  Diagnosis Date  . Allergy   . Depression   . GERD (gastroesophageal reflux disease)   . Hypertension   . Hyperlipidemia   . Heart failure [facility])     Past Surgical History  Procedure Laterality Date  . Tendon repair  1986    right hand  . Heart catherization   2015    no blockages  . Ep implantable device  2015    defibrillator/pacemaker  . Tumor removal  1995    right ear    Current Outpatient Prescriptions  Medication Sig Dispense Refill  . aspirin 325 MG tablet Take 325 mg by mouth daily.    . diphenhydrAMINE (SOMINEX) 25 MG tablet Take 25 mg by mouth at bedtime as needed for sleep.    . fenofibrate (TRICOR) 145 MG tablet Take 1 tablet (145 mg total) by mouth daily. 90 tablet 1  . FLUoxetine (PROZAC) 10 MG capsule Take 10 mg by mouth daily.    . furosemide (LASIX) 40 MG tablet Take 1 tablet (40 mg total) by mouth daily. 30 tablet 3  . ibuprofen (ADVIL,MOTRIN) 800 MG tablet Take 800 mg by mouth every 8 (eight) hours as needed for moderate pain.     Marland Kitchen lisinopril (PRINIVIL,ZESTRIL) 20 MG tablet Take 1 tablet (20 mg total) by mouth daily. 30 tablet 3  . metoprolol succinate (TOPROL-XL) 25 MG 24 hr tablet Take 1 tablet (25 mg total) by mouth daily. 30 tablet 2   No current facility-administered medications for this visit.    Allergies as of 03/02/2016 - Review Complete 03/02/2016  Allergen Reaction Noted  . Coreg [carvedilol] Swelling 12/27/2015    Family History  Problem Relation Age of Onset  . Breast cancer Mother   . Depression Mother   .  Diabetes Mother   . Hearing loss Mother   . Hyperlipidemia Mother   . Hypertension Mother   . Mental illness Mother   . Vision loss Mother   . Alcohol abuse Father   . Arthritis Father   . Early death Father   . Hyperlipidemia Father   . Hypertension Father   . Vision loss Father   . Colon cancer Paternal Grandmother   . Colon polyps Neg Hx   . Liver cancer Cousin     Social History   Social History  . Marital Status: Legally Separated    Spouse Name: N/A  . Number of Children: N/A  . Years of Education: N/A   Occupational History  . Not on file.   Social History Main Topics  . Smoking status: Current Every Day Smoker -- 0.50 packs/day for 40 years    Types: Cigarettes  .  Smokeless tobacco: Never Used  . Alcohol Use: 2.4 oz/week    4 Shots of liquor per week     Comment: weekly; cut down significantly   . Drug Use: No  . Sexual Activity: No   Other Topics Concern  . Not on file   Social History Narrative    Review of Systems: As mentioned in HPI.   Physical Exam: BP 123/72 mmHg  Pulse 75  Temp(Src) 96.9 F (36.1 C)  Ht 5\' 8"  (1.727 m)  Wt 271 lb 9.6 oz (123.197 kg)  BMI 41.31 kg/m2 General:   Alert and oriented. Pleasant and cooperative. Well-nourished and well-developed.  Head:  Normocephalic and atraumatic. Eyes:  Without icterus, sclera clear and conjunctiva pink.  Ears:  Normal auditory acuity. Nose:  No deformity, discharge,  or lesions. Mouth:  No deformity or lesions, oral mucosa pink.  Lungs:  Clear to auscultation bilaterally. No wheezes, rales, or rhonchi. No distress.  Heart:  S1, S2 present without murmurs appreciated.  Abdomen:  +BS, soft, obese, non-tense ascites noted, protruding ventral/umbilical hernia with ulceration inferiorly Rectal:  Deferred  Msk:  Symmetrical without gross deformities. Normal posture. Extremities:  Left lower extremity significantly more edematous than right.  Neurologic:  Alert and  oriented x4;  grossly normal neurologically. Skin:  Intact without significant lesions or rashes. Psych:  Alert and cooperative. Normal mood and affect.   Lab Results  Component Value Date   ALT 9 01/28/2016   AST 21 01/28/2016   ALKPHOS 105 01/28/2016   BILITOT 0.4 01/28/2016   Lab Results  Component Value Date   ALT 9 01/28/2016   AST 21 01/28/2016   ALKPHOS 105 01/28/2016   BILITOT 0.4 01/28/2016  . Lab Results  Component Value Date   CREATININE 1.19 01/28/2016   BUN 16 01/28/2016   NA 137 01/28/2016   K 5.1 01/28/2016   CL 101 01/28/2016   CO2 20 01/28/2016   March 2017 CT abd/pelvis with contrast: IMPRESSION: Mild sigmoid diverticulitis, with associated colovesical fistula. No evidence of  abscess.  Hepatic cirrhosis. No evidence of hepatic neoplasm.  Large amount of ascites and findings consistent with portal venous hypertension.

## 2016-03-02 NOTE — Telephone Encounter (Signed)
I didn't realize patient hasn't had a recent CBC. Can [facility] fax [facility] to [facility]? He should be getting labs done Friday. I gave him the rest of the labs at his appt. Just want to make sure he goes to the right place.

## 2016-03-03 ENCOUNTER — Other Ambulatory Visit (HOSPITAL_COMMUNITY): Payer: Medicaid Other

## 2016-03-03 ENCOUNTER — Encounter: Payer: Self-pay | Admitting: Gastroenterology

## 2016-03-03 NOTE — Progress Notes (Signed)
cc'ed to pcp °

## 2016-03-03 NOTE — Telephone Encounter (Signed)
Lab order has been faxed.

## 2016-03-04 ENCOUNTER — Ambulatory Visit (HOSPITAL_COMMUNITY)
Admission: RE | Admit: 2016-03-04 | Discharge: 2016-03-04 | Disposition: A | Discharge status: Home / Self Care | Payer: Medicaid Other | Source: Ambulatory Visit | Attending: Gastroenterology | Admitting: Gastroenterology

## 2016-03-04 DIAGNOSIS — R600 Localized edema: Secondary | ICD-10-CM | POA: Diagnosis present

## 2016-03-04 NOTE — Progress Notes (Signed)
Quick Note:  No evidence of DVT. This is good news. ______

## 2016-03-10 ENCOUNTER — Telehealth: Payer: Self-pay | Admitting: Gastroenterology

## 2016-03-10 NOTE — Telephone Encounter (Signed)
I called pt and he said the lab refused to draw his blood yesterday because he has medicaid, and he owes a balance. I called [facility] and spoke to [place] who said she gave the pt paperwork and highlighted the phone number for him to call to resubmit the bill from last year since he said he had medicaid at that time. She told him he could also work out a Insurance claims handlerpayment plan and come back and she would draw his blood. So I called the pt back and reminded him of what he was told yesterday. He said he tried to call them yesterday and never did get them, but he will try again. I stressed to him how important it is for him to get this blood work because of a change in his diurectics. He said he will try to get it worked out.

## 2016-03-10 NOTE — Telephone Encounter (Signed)
Patient needed a CMP this past Friday. Looks like it may be pending in epic. He had other labs to be drawn that day as well. Very important he has this done ASAP, as I changed his diuretics.

## 2016-03-13 ENCOUNTER — Telehealth: Payer: Self-pay | Admitting: Family Medicine

## 2016-03-13 DIAGNOSIS — K7031 Alcoholic cirrhosis of liver with ascites: Secondary | ICD-10-CM

## 2016-03-13 NOTE — Telephone Encounter (Signed)
Yes go ahead and put in an order for ultrasound paracentesis of the abdomen. Has he not seen gastroenterology they usually manages this and get him set up on rolling orders. If he has not let's do a referral for gastroenterology

## 2016-03-16 NOTE — Telephone Encounter (Signed)
Left message for patient to call back to see if he sees Gastroenterology.  Order has been place for paracentesis.

## 2016-03-18 NOTE — Telephone Encounter (Signed)
Looks like a CMP is in process? Did he go to [facility]?

## 2016-03-18 NOTE — Telephone Encounter (Signed)
Tried to call. Got cut off.

## 2016-03-19 ENCOUNTER — Ambulatory Visit (HOSPITAL_COMMUNITY)
Admission: RE | Admit: 2016-03-19 | Discharge: 2016-03-19 | Disposition: A | Discharge status: Home / Self Care | Payer: Medicaid Other | Source: Ambulatory Visit | Attending: Family Medicine | Admitting: Family Medicine

## 2016-03-19 DIAGNOSIS — K7031 Alcoholic cirrhosis of liver with ascites: Secondary | ICD-10-CM | POA: Diagnosis not present

## 2016-03-19 NOTE — Sedation Documentation (Signed)
Patient is resting comfortably. 

## 2016-03-19 NOTE — Sedation Documentation (Signed)
Vital signs stable. 

## 2016-03-19 NOTE — Telephone Encounter (Signed)
Pt not available. Will have to call back.

## 2016-03-19 NOTE — Procedures (Signed)
PreOperative Dx: Cirrhosis, ascites Postoperative Dx: Cirrhosis, ascites Procedure:   US guided paracentesis Radiologist:  Tyron RussellBoles Anesthesia:  10 ml of1% lidocaine Specimen:  15 liters of dark amber colored ascitic fluid EBL:   < 1 ml Complications: None

## 2016-03-19 NOTE — Discharge Instructions (Signed)
Ascites °Ascites is a collection of excess fluid in the abdomen. Ascites can range from mild to severe. It can get worse without treatment. °CAUSES °Possible causes include: °· Cirrhosis. This is the most common cause of ascites. °· Infection or inflammation in the abdomen. °· Cancer in the abdomen. °· Heart failure. °· Kidney disease. °· Inflammation of the pancreas. °· Clots in the veins of the liver. °SIGNS AND SYMPTOMS °Signs and symptoms may include: °· A feeling of fullness in your abdomen. This is common. °· An increase in the size of your abdomen or your waist. °· Swelling in your legs. °· Swelling of the scrotum in men. °· Difficulty breathing. °· Abdominal pain. °· Sudden weight gain. °If the condition is mild, you may not have symptoms. °DIAGNOSIS °To make a diagnosis, your health care provider will: °· Ask about your medical history. °· Perform a physical exam. °· Order imaging tests, such as an ultrasound or CT scan of your abdomen. °TREATMENT °Treatment depends on the cause of the ascites. It may include: °· Taking a pill to make you urinate. This is called a water pill (diuretic pill). °· Strictly reducing your salt (sodium) intake. Salt can cause extra fluid to be kept in the body, and this makes ascites worse. °· Having a procedure to remove fluid from your abdomen (paracentesis). °· Having a procedure to transfer fluid from your abdomen into a vein. °· Having a procedure that connects two of the major veins within your liver and relieves pressure on your liver (TIPS procedure). °Ascites may go away or improve with treatment of the condition that caused it.  °HOME CARE INSTRUCTIONS °· Keep track of your weight. To do this, weigh yourself at the same time every day and record your weight. °· Keep track of how much you drink and any changes in the amount you urinate. °· Follow any instructions that your health care provider gives you about how much to drink. °· Try not to eat salty (high-sodium)  foods. °· Take medicines only as directed by your health care provider. °· Keep all follow-up visits as directed by your health care provider. This is important. °· Report any changes in your health to your health care provider, especially if you develop new symptoms or your symptoms get worse. °SEEK MEDICAL CARE IF: °· Your gain more than 3 pounds in 3 days. °· Your abdominal size or your waist size increases. °· You have new swelling in your legs. °· The swelling in your legs gets worse. °SEEK IMMEDIATE MEDICAL CARE IF: °· You develop a fever. °· You develop confusion. °· You develop new or worsening difficulty breathing. °· You develop new or worsening abdominal pain. °· You develop new or worsening swelling in the scrotum (in men). °  °This information is not intended to replace advice given to you by your health care provider. Make sure you discuss any questions you have with your health care provider. °  °Document Released: 10/12/2005 Document Revised: 11/02/2014 Document Reviewed: 05/11/2014 °Elsevier Interactive Patient Education ©2016 Elsevier Inc. ° °

## 2016-03-19 NOTE — Sedation Documentation (Signed)
Patient denies pain and is resting comfortably.  

## 2016-03-19 NOTE — Telephone Encounter (Signed)
Pt has not been able to get labs done yet. I will fax orders to [facility] and he will try them.

## 2016-03-19 NOTE — Sedation Documentation (Signed)
MD at bedside. Lidocaine injected by MD subcutaneous for numbing medication. Pt tolerated without difficulty. Fluid draining off without difficulty.

## 2016-03-19 NOTE — Sedation Documentation (Signed)
15L of dark amber fluid removed. Pt tolerated without any difficulty.

## 2016-03-19 NOTE — Sedation Documentation (Addendum)
Vital signs stable. 

## 2016-03-25 NOTE — Telephone Encounter (Signed)
Patient goes to [facility] and has Gerrit HallsAnna Sams NP as a provider.

## 2016-03-25 NOTE — Telephone Encounter (Signed)
Per chart, patient completed procedure.   Appointment at [facility] was completed on 03-02-2016.

## 2016-03-30 NOTE — Patient Instructions (Signed)
Lizbeth BarkMarty Granade  03/30/2016     @PREFPERIOPPHARMACY @   Your procedure is scheduled on 04/07/2016.  [facility] to [facility] at 7:00 A.M.  Call this number if you have problems the morning of surgery:  [phone]   Remember:  Do not eat food or drink liquids after midnight.  Take these medicines the morning of surgery with A SIP OF WATER Prozac, Lisinopril, Metoprolol   Do not wear jewelry, make-up or nail polish.  Do not wear lotions, powders, or perfumes.  You may wear deodorant.  Do not shave 48 hours prior to surgery.  Men may shave face and neck.  Do not bring valuables to the hospital.  [facility] is not [facility] any belongings or valuables.  Contacts, dentures or bridgework may not be worn into surgery.  Leave your suitcase in the car.  After surgery it may be brought to your room.  For patients admitted to the hospital, discharge time will be determined by your treatment team.  Patients discharged the day of surgery will not be allowed to drive home.   Please read over the following fact sheets that you were given. Anesthesia Post-op Instructions     PATIENT INSTRUCTIONS POST-ANESTHESIA  IMMEDIATELY FOLLOWING SURGERY:  Do not drive or operate machinery for the first twenty four hours after surgery.  Do not make any important decisions for twenty four hours after surgery or while taking narcotic pain medications or sedatives.  If you develop intractable nausea and vomiting or a severe headache please notify your doctor immediately.  FOLLOW-UP:  Please make an appointment with your surgeon as instructed. You do not need to follow up with anesthesia unless specifically instructed to do so.  WOUND CARE INSTRUCTIONS (if applicable):  Keep a dry clean dressing on the anesthesia/puncture wound site if there is drainage.  Once the wound has quit draining you may leave it open to air.  Generally you should leave the bandage intact for twenty four hours unless there is  drainage.  If the epidural site drains for more than 36-48 hours please call the anesthesia department.  QUESTIONS?:  Please feel free to call your physician or the hospital operator if you have any questions, and they will be happy to assist you.      Esophagogastroduodenoscopy Esophagogastroduodenoscopy (EGD) is a procedure that is used to examine the lining of the esophagus, stomach, and first part of the small intestine (duodenum). A long, flexible, lighted tube with a camera attached (endoscope) is inserted down the throat to view these organs. This procedure is done to detect problems or abnormalities, such as inflammation, bleeding, ulcers, or growths, in order to treat them. The procedure lasts 5-20 minutes. It is usually an outpatient procedure, but it may need to be performed in a hospital in emergency cases. LET [facility] PROVIDER KNOW ABOUT:  Any allergies you have.  All medicines you are taking, including vitamins, herbs, eye drops, creams, and over-the-counter medicines.  Previous problems you or members of your family have had with the use of anesthetics.  Any blood disorders you have.  Previous surgeries you have had.  Medical conditions you have. RISKS AND COMPLICATIONS Generally, this is a safe procedure. However, problems can occur and include:  Infection.  Bleeding.  Tearing (perforation) of the esophagus, stomach, or duodenum.  Difficulty breathing or not being able to breathe.  Excessive sweating.  Spasms of the larynx.  Slowed heartbeat.  Low blood pressure. BEFORE THE PROCEDURE  Do not  eat or drink anything after midnight on the night before the procedure or as directed by your health care provider.  Do not take your regular medicines before the procedure if your health care provider asks you not to. Ask your health care provider about changing or stopping those medicines.  If you wear dentures, be prepared to remove them before the  procedure.  Arrange for someone to drive you home after the procedure. PROCEDURE  A numbing medicine (local anesthetic) may be sprayed in your throat for comfort and to stop you from gagging or coughing.  You will have an IV tube inserted in a vein in your hand or arm. You will receive medicines and fluids through this tube.  You will be given a medicine to relax you (sedative).  A pain reliever will be given through the IV tube.  A mouth guard may be placed in your mouth to protect your teeth and to keep you from biting on the endoscope.  You will be asked to lie on your left side.  The endoscope will be inserted down your throat and into your esophagus, stomach, and duodenum.  Air will be put through the endoscope to allow your health care provider to clearly view the lining of your esophagus.  The lining of your esophagus, stomach, and duodenum will be examined. During the exam, your health care provider may:  Remove tissue to be examined under a microscope (biopsy) for inflammation, infection, or other medical problems.  Remove growths.  Remove objects (foreign bodies) that are stuck.  Treat any bleeding with medicines or other devices that stop tissues from bleeding (hot cautery, clipping devices).  Widen (dilate) or stretch narrowed areas of your esophagus and stomach.  The endoscope will be withdrawn. AFTER THE PROCEDURE  You will be taken to a recovery area for observation. Your blood pressure, heart rate, breathing rate, and blood oxygen level will be monitored often until the medicines you were given have worn off.  Do not eat or drink anything until the numbing medicine has worn off and your gag reflex has returned. You may choke.  Your health care provider should be able to discuss his or her findings with you. It will take longer to discuss the test results if any biopsies were taken.   This information is not intended to replace advice given to you by your  health care provider. Make sure you discuss any questions you have with your health care provider.   Document Released: 02/12/2005 Document Revised: 11/02/2014 Document Reviewed: 09/14/2012 Elsevier Interactive Patient Education [employer]

## 2016-04-01 ENCOUNTER — Encounter (HOSPITAL_COMMUNITY)
Admission: RE | Admit: 2016-04-01 | Discharge: 2016-04-01 | Disposition: A | Discharge status: Home / Self Care | Payer: Medicaid Other | Source: Ambulatory Visit | Attending: Gastroenterology | Admitting: Gastroenterology

## 2016-04-02 ENCOUNTER — Telehealth: Payer: Self-pay

## 2016-04-02 NOTE — Telephone Encounter (Signed)
Pt called back and will call short stay back today to reschedule pre-op

## 2016-04-02 NOTE — Telephone Encounter (Signed)
Called to reschedule his pre-op appt.   LM with family to call office back

## 2016-04-03 ENCOUNTER — Encounter (HOSPITAL_COMMUNITY): Payer: Self-pay

## 2016-04-03 ENCOUNTER — Encounter (HOSPITAL_COMMUNITY)
Admission: RE | Admit: 2016-04-03 | Discharge: 2016-04-03 | Disposition: A | Discharge status: Home / Self Care | Payer: Medicaid Other | Source: Ambulatory Visit | Attending: Gastroenterology | Admitting: Gastroenterology

## 2016-04-03 ENCOUNTER — Other Ambulatory Visit: Payer: Self-pay

## 2016-04-03 ENCOUNTER — Other Ambulatory Visit: Payer: Self-pay | Admitting: Family Medicine

## 2016-04-03 DIAGNOSIS — Z01810 Encounter for preprocedural cardiovascular examination: Secondary | ICD-10-CM | POA: Diagnosis present

## 2016-04-03 DIAGNOSIS — Z01812 Encounter for preprocedural laboratory examination: Secondary | ICD-10-CM | POA: Diagnosis not present

## 2016-04-03 HISTORY — DX: Idiopathic aseptic necrosis of right femur: M87.051

## 2016-04-03 HISTORY — DX: Gout, unspecified: M10.9

## 2016-04-03 HISTORY — DX: Presence of automatic (implantable) cardiac defibrillator: Z95.810

## 2016-04-03 HISTORY — DX: Inflammatory liver disease, unspecified: K75.9

## 2016-04-03 LAB — BASIC METABOLIC PANEL
Anion gap: 7 (ref 5–15)
BUN: 14 mg/dL (ref 6–20)
CHLORIDE: 103 mmol/L (ref 101–111)
CO2: 24 mmol/L (ref 22–32)
Calcium: 8.4 mg/dL — ABNORMAL LOW (ref 8.9–10.3)
Creatinine, Ser: 1.08 mg/dL (ref 0.61–1.24)
GFR calc non Af Amer: >60 mL/min (ref >60)
Glucose, Bld: 81 mg/dL (ref 65–99)
POTASSIUM: 5.1 mmol/L (ref 3.5–5.1)
SODIUM: 134 mmol/L — AB (ref 135–145)

## 2016-04-03 LAB — CBC
HEMATOCRIT: 38 % — AB (ref 39.0–52.0)
Hemoglobin: 12.3 g/dL — ABNORMAL LOW (ref 13.0–17.0)
MCH: 30.1 pg (ref 26.0–34.0)
MCHC: 32.4 g/dL (ref 30.0–36.0)
MCV: 92.9 fL (ref 78.0–100.0)
Platelets: 193 10*3/uL (ref 150–400)
RBC: 4.09 MIL/uL — AB (ref 4.22–5.81)
RDW: 14.8 % (ref 11.5–15.5)
WBC: 6.1 10*3/uL (ref 4.0–10.5)

## 2016-04-03 NOTE — Progress Notes (Signed)
   04/03/16 1044  OBSTRUCTIVE SLEEP APNEA  Have you ever been diagnosed with sleep apnea through a sleep study? No  Do you snore loudly (loud enough to be heard through closed doors)?  1  Do you often feel tired, fatigued, or sleepy during the daytime (such as falling asleep during driving or talking to someone)? 1  Has anyone observed you stop breathing during your sleep? 1  Do you have, or are you being treated for high blood pressure? 1  BMI more than 35 kg/m2? 1  Age > 50 (1-yes) 1  Neck circumference greater than:Male 16 inches or larger, Male 17inches or larger? 1  Male Gender (Yes=1) 1  Obstructive Sleep Apnea Score 8  Score 5 or greater  Results sent to PCP

## 2016-04-03 NOTE — Pre-Procedure Instructions (Signed)
Patient given information to sign up for my chart at home. 

## 2016-04-07 ENCOUNTER — Encounter (HOSPITAL_COMMUNITY): Admission: RE | Payer: Self-pay | Source: Ambulatory Visit

## 2016-04-07 ENCOUNTER — Ambulatory Visit (HOSPITAL_COMMUNITY): Admission: RE | Admit: 2016-04-07 | Payer: Medicaid Other | Source: Ambulatory Visit | Admitting: Gastroenterology

## 2016-04-07 SURGERY — ESOPHAGOGASTRODUODENOSCOPY (EGD) WITH PROPOFOL
Anesthesia: Monitor Anesthesia Care

## 2016-04-08 NOTE — Telephone Encounter (Signed)
LM for pt to return my call

## 2016-04-08 NOTE — Telephone Encounter (Signed)
Any update on lab situation?

## 2016-04-09 NOTE — Telephone Encounter (Signed)
Letter mailed to pt to call and let us know about the labs.

## 2016-04-23 ENCOUNTER — Ambulatory Visit: Payer: Medicaid Other | Admitting: Gastroenterology

## 2016-04-30 ENCOUNTER — Other Ambulatory Visit: Payer: Self-pay | Admitting: Family Medicine

## 2016-05-04 ENCOUNTER — Encounter: Payer: Self-pay | Admitting: Internal Medicine

## 2016-05-04 ENCOUNTER — Ambulatory Visit (INDEPENDENT_AMBULATORY_CARE_PROVIDER_SITE_OTHER): Payer: Medicaid Other | Admitting: Internal Medicine

## 2016-05-04 ENCOUNTER — Other Ambulatory Visit: Payer: Self-pay | Admitting: Family Medicine

## 2016-05-04 VITALS — BP 120/80 | HR 87 | Ht 68.0 in | Wt 273.0 lb

## 2016-05-04 DIAGNOSIS — I5022 Chronic systolic (congestive) heart failure: Secondary | ICD-10-CM | POA: Diagnosis not present

## 2016-05-04 LAB — CUP PACEART INCLINIC DEVICE CHECK
HighPow Impedance: 80 Ohm
Implantable Lead Implant Date: 20151117
Implantable Lead Implant Date: 20151117
Implantable Lead Location: 753862
Implantable Lead Location: 753862
Implantable Lead Model: 292
Implantable Lead Model: 4087
Implantable Lead Model: 4591
Implantable Lead Serial Number: 304499
Implantable Lead Serial Number: 355717
Lead Channel Impedance Value: 725 Ohm
Lead Channel Pacing Threshold Amplitude: 0.5 V
Lead Channel Pacing Threshold Amplitude: 0.8 V
Lead Channel Sensing Intrinsic Amplitude: 25 mV
Lead Channel Setting Pacing Amplitude: 1.5 V
Lead Channel Setting Pacing Amplitude: 2 V
Lead Channel Setting Sensing Sensitivity: 1 mV
MDC IDC LEAD IMPLANT DT: 20151117
MDC IDC LEAD LOCATION: 753862
MDC IDC LEAD SERIAL: 312116
MDC IDC MSMT LEADCHNL LV IMPEDANCE VALUE: 529 Ohm
MDC IDC MSMT LEADCHNL LV PACING THRESHOLD PULSEWIDTH: 0.4 ms
MDC IDC MSMT LEADCHNL LV SENSING INTR AMPL: 15.3 mV
MDC IDC MSMT LEADCHNL RA IMPEDANCE VALUE: 528 Ohm
MDC IDC MSMT LEADCHNL RA PACING THRESHOLD PULSEWIDTH: 0.5 ms
MDC IDC MSMT LEADCHNL RA SENSING INTR AMPL: 9.2 mV
MDC IDC MSMT LEADCHNL RV PACING THRESHOLD AMPLITUDE: 0.5 V
MDC IDC MSMT LEADCHNL RV PACING THRESHOLD PULSEWIDTH: 0.5 ms
MDC IDC SESS DTM: 20170710040000
MDC IDC SET LEADCHNL LV PACING PULSEWIDTH: 0.4 ms
MDC IDC SET LEADCHNL RA PACING AMPLITUDE: 2 V
MDC IDC SET LEADCHNL RV PACING PULSEWIDTH: 0.5 ms
MDC IDC SET LEADCHNL RV SENSING SENSITIVITY: 0.6 mV
Pulse Gen Serial Number: 476760

## 2016-05-04 NOTE — Patient Instructions (Signed)
Your physician wants you to follow-up in: 1 Year with Dr. Ladona Ridgelaylor. You will receive a reminder letter in the mail two months in advance. If you don't receive a letter, please call our office to schedule the follow-up appointment.  Remote monitoring is used to monitor your Pacemaker of ICD from home. This monitoring reduces the number of office visits required to check your device to one time per year. It allows us to keep an eye on the functioning of your device to ensure it is working properly. You are scheduled for a device check from home on 08/03/16. You may send your transmission at any time that day. If you have a wireless device, the transmission will be sent automatically. After your physician reviews your transmission, you will receive a postcard with your next transmission date.  Your physician recommends that you continue on your current medications as directed. Please refer to the Current Medication list given to you today.  If you need a refill on your cardiac medications before your next appointment, please call your pharmacy.  Thank you for choosing [facility]!

## 2016-05-04 NOTE — Progress Notes (Signed)
HPI Mr. Russell Trujillo presents today for initial evaluation. He has a remote non-ischemic CM, and severe LV dysfunction and LBBB, who underwent BiV ICD implant almost 2 years ago. In the interim he has had normalization of his LV function. He has recently moved from [place] to be closer to his family. He has liver disease and is morbidly obese. 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 No ICD shocks. He is fairly sedentary with his obesity.  Allergies  Allergen Reactions  . Coreg [Carvedilol] Swelling    Swelling of lips     Current Outpatient Prescriptions  Medication Sig Dispense Refill  . aspirin 325 MG tablet Take 325 mg by mouth daily.    . diphenhydrAMINE (SOMINEX) 25 MG tablet Take 25 mg by mouth at bedtime as needed for sleep.    . fenofibrate (TRICOR) 145 MG tablet Take 1 tablet (145 mg total) by mouth daily. 90 tablet 1  . FLUoxetine (PROZAC) 10 MG capsule Take 10 mg by mouth daily.    . furosemide (LASIX) 40 MG tablet Take 1 tablet (40 mg total) by mouth daily. 30 tablet 3  . ibuprofen (ADVIL,MOTRIN) 800 MG tablet Take 800 mg by mouth every 8 (eight) hours as needed for moderate pain.     Marland Kitchen lisinopril (PRINIVIL,ZESTRIL) 20 MG tablet TAKE 1 TABLET DAILY 30 tablet 2  . metoprolol succinate (TOPROL-XL) 25 MG 24 hr tablet TAKE 1 TABLET DAILY 30 tablet 0  . spironolactone (ALDACTONE) 50 MG tablet Take 1 tablet (50 mg total) by mouth daily. 30 tablet 5   No current facility-administered medications for this visit.     Past Medical History  Diagnosis Date  . Allergy   . Depression   . GERD (gastroesophageal reflux disease)   . Hypertension   . Hyperlipidemia   . Heart failure (HCC)   . AICD (automatic cardioverter/defibrillator) present   . Gout   . Hepatitis     C  . Presence of permanent cardiac pacemaker   . 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   Past Surgical History    Procedure Laterality Date  . Tendon repair  1986    right hand  . Heart catherization  2015    no blockages  . Ep implantable device  2015    defibrillator/pacemaker  . Tumor removal  1995    right ear  . Colonoscopy  Nov 2016    Dr. Rossie Muskrat at [facility]: two 6-8 mm polyps (tubular adenomas). Surveillance 2019.      Family History  Problem Relation Age of Onset  . Breast cancer Mother   . Depression Mother   . Diabetes Mother   . Hearing loss Mother   . Hyperlipidemia Mother   . Hypertension Mother   . Mental illness Mother   . Vision loss Mother   . Alcohol abuse Father   . Arthritis Father   . Early death Father   . Hyperlipidemia Father   . Hypertension Father   . Vision loss Father   . Colon cancer Paternal Grandmother   . Colon polyps Neg Hx   . Liver cancer Cousin      Social History   Social History  . Marital Status: Legally Separated    Spouse Name: N/A  . Number of Children: N/A  . Years of Education: N/A   Occupational History  .  Not on file.   Social History Main Topics  . Smoking status: Current Every Day Smoker -- 0.50 packs/day for 40 years    Types: Cigarettes  . Smokeless tobacco: Never Used  . Alcohol Use: 2.4 oz/week    4 Shots of liquor per week     Comment: has not had any since 02/2016.  . Drug Use: No  . Sexual Activity: No   Other Topics Concern  . Not on file   Social History Narrative     BP 120/80 mmHg  Pulse 87  Ht 5\' 8"  (1.727 m)  Wt 273 lb (123.832 kg)  BMI 41.52 kg/m2  SpO2 95%  Physical Exam:  Morbidly obese appearing 55 yo man, NAD HEENT: Unremarkable Neck:  6 cm JVD, no thyromegally Lymphatics:  No adenopathy Back:  No CVA tenderness Lungs:  Clear with no wheezes HEART:  Regular rate rhythm, no murmurs, no rubs, no clicks Abd:  soft, obese, soft hernia, reducible, positive bowel sounds, no organomegally, no rebound, no guarding Ext:  2 plus pulses, 1+ peripheral edema, no cyanosis, no clubbing Skin:  No  rashes no nodules Neuro:  CN II through XII intact, motor grossly intact  DEVICE  Normal device function.  See PaceArt for details.   Assess/Plan: 1. Chronic systolic heart failure - by echo a year ago, he has had normalization of his LV function. Will follow. 2. Morbid obesity - he is encouraged to lose weight. 3. ICD - his [place] [employer] BiV ICD is working normally. Will recheck in several months. 4. HTN - his blood pressure is well controlled. Will follow.  Leonia ReevesGregg Chanise Habeck,M.D.

## 2016-05-20 ENCOUNTER — Telehealth: Payer: Self-pay

## 2016-05-20 ENCOUNTER — Other Ambulatory Visit: Payer: Self-pay

## 2016-05-20 DIAGNOSIS — R188 Other ascites: Secondary | ICD-10-CM

## 2016-05-20 NOTE — Telephone Encounter (Signed)
Orders are done and he is set up for  05/21/16 @ 12:00. He is aware

## 2016-05-20 NOTE — Telephone Encounter (Signed)
Pt is calling to see if he can have a PARA done. He states that his belly is tight and making his hernia hurt. Please advise

## 2016-05-20 NOTE — Telephone Encounter (Signed)
Yes. I don't see where he has ever had any fluid analysis done of his ascites.   Needs fluid cultures, cell count, cytology at time of paracentesis.   Needs 25 grams of albumin at 4 liters.   Needs non-urgent OV in next 4-6 weeks.

## 2016-05-21 ENCOUNTER — Ambulatory Visit (HOSPITAL_COMMUNITY)
Admission: RE | Admit: 2016-05-21 | Discharge: 2016-05-21 | Disposition: A | Discharge status: Home / Self Care | Payer: Medicaid Other | Source: Ambulatory Visit | Attending: Gastroenterology | Admitting: Gastroenterology

## 2016-05-21 ENCOUNTER — Encounter (HOSPITAL_COMMUNITY): Payer: Self-pay

## 2016-05-21 DIAGNOSIS — R188 Other ascites: Secondary | ICD-10-CM | POA: Insufficient documentation

## 2016-05-21 LAB — GRAM STAIN

## 2016-05-21 LAB — BODY FLUID CELL COUNT WITH DIFFERENTIAL
Eos, Fluid: 0 %
Lymphs, Fluid: 71 %
Monocyte-Macrophage-Serous Fluid: 23 % — ABNORMAL LOW (ref 50–90)
Neutrophil Count, Fluid: 6 % (ref 0–25)
Total Nucleated Cell Count, Fluid: 288 cu mm (ref 0–1000)

## 2016-05-21 MED ORDER — ALBUMIN HUMAN 25 % IV SOLN
25.0000 g | Freq: Once | INTRAVENOUS | Status: AC
  Administered 2016-05-21: 25 g via INTRAVENOUS

## 2016-05-21 MED ORDER — ALBUMIN HUMAN 25 % IV SOLN
INTRAVENOUS | Status: AC
  Administered 2016-05-21: 25 g via INTRAVENOUS
  Filled 2016-05-21: qty 100

## 2016-05-21 NOTE — Progress Notes (Signed)
Paracentesis complete no signs of distress. 14300 ml yellow colored ascites removed.

## 2016-05-25 ENCOUNTER — Telehealth: Payer: Self-pay | Admitting: *Deleted

## 2016-05-25 NOTE — Telephone Encounter (Signed)
Mother called in stating patients bp was 91/34 and that they had checked it 3 times. Mother states that he has fallen 3 time and is complaining with left arm an hand pain. She states that he is very sleepy. Patients mother was advised to call 911 and have them come evaluate him and transport to hospital. Mother states that he does heart problems. Mother agreed and states she will call 911.

## 2016-05-26 LAB — CULTURE, BODY FLUID W GRAM STAIN -BOTTLE

## 2016-05-26 LAB — CULTURE, BODY FLUID-BOTTLE: CULTURE: NO GROWTH

## 2016-05-28 NOTE — Progress Notes (Signed)
No evidence of SBP. Cytology negative. Extremely large volume paracentesis. Keep appt for mid August with me.

## 2016-05-29 NOTE — Progress Notes (Signed)
Pt is aware.  

## 2016-06-04 ENCOUNTER — Ambulatory Visit (INDEPENDENT_AMBULATORY_CARE_PROVIDER_SITE_OTHER): Payer: Medicaid Other | Admitting: Gastroenterology

## 2016-06-04 ENCOUNTER — Encounter: Payer: Self-pay | Admitting: Gastroenterology

## 2016-06-04 ENCOUNTER — Other Ambulatory Visit: Payer: Self-pay

## 2016-06-04 VITALS — BP 120/72 | HR 82 | Temp 97.0°F | Ht 68.0 in | Wt 258.0 lb

## 2016-06-04 DIAGNOSIS — K7031 Alcoholic cirrhosis of liver with ascites: Secondary | ICD-10-CM

## 2016-06-04 DIAGNOSIS — I8500 Esophageal varices without bleeding: Secondary | ICD-10-CM

## 2016-06-04 NOTE — Patient Instructions (Signed)
Please have blood work done at [facility].  We have scheduled you for an upper endoscopy with Dr. Darrick PennaFields in the near future.  We have also scheduled you for an ultrasound of your liver.   We will see you in 3 months.

## 2016-06-04 NOTE — Progress Notes (Addendum)
REVIEWED. CANCELLED EGD AUG AND SEP 2017.  Referring Provider: Dettinger, Elige RadonJoshua A, MD Primary Care Physician:  Elige RadonJoshua A Dettinger, MD  Primary GI: Dr. Darrick PennaFields   Chief Complaint  Patient presents with  . Cirrhosis    fluid drained 2 weeks ago    HPI:   Russell Trujillo is a 55 y.o. 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 He cancelled his EGD in June 2017. [facility] 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 Similar findings in Jan 2016 from outside CT in [place]. [facility] care of this. Colonoscopy Nov 2016 by Dr. Rossie MuskratAl-Sabbagh with [facility] noting two 6-8 mm polyps (tubular adenomas), mild diverticulosis.   Had paracentesis July 27th with fluid analysis. No evidence of SBP and cytology negative at that time. Still needs Hep B, C, INR, completed. At last visit in May, he was prescribed low dose aldactone 50 mg along with Lasix 40 mg daily.   Feels better since having fluid pulled off recently. Having issues with his hernia. Having issues taking his fluid pills. Urinating is painful, so he is unable to take the fluid pills as he is supposed to. Was unable to have EGD done due to transportation issues. [facility]. [facility]. Stressed out and has "slipped up and drank ETOH".  Sometimes putting extra salt on his food. Last time he drank alcohol was a few weeks ago.   Past Medical History:  Diagnosis Date  . AICD (automatic cardioverter/defibrillator) present   . Allergy   . 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 Depression   . GERD (gastroesophageal reflux disease)   . Gout   . Heart failure (HCC)   . Hepatitis    ? unsure   . Hyperlipidemia   . Hypertension   .  Presence of permanent cardiac pacemaker     Past Surgical History:  Procedure Laterality Date  . COLONOSCOPY  Nov 2016   Dr. Rossie MuskratAl-Sabbagh at [facility]: two 6-8 mm polyps (tubular adenomas). Surveillance 2019.   Marland Kitchen. EP IMPLANTABLE DEVICE  2015   defibrillator/pacemaker  . heart catherization  2015   no blockages  . TENDON REPAIR  1986   right hand  . TUMOR REMOVAL  1995   right ear    Current Outpatient Prescriptions  Medication Sig Dispense Refill  . aspirin 325 MG tablet Take 325 mg by mouth daily.    . diphenhydrAMINE (SOMINEX) 25 MG tablet Take 25 mg by mouth at bedtime as needed for sleep.    . fenofibrate (TRICOR) 145 MG tablet Take 1 tablet (145 mg total) by mouth daily. 90 tablet 1  . FLUoxetine (PROZAC) 10 MG capsule Take 10 mg by mouth daily.    Marland Kitchen. ibuprofen (ADVIL,MOTRIN) 800 MG tablet Take 800 mg by mouth every 8 (eight) hours as needed for moderate pain.     Marland Kitchen. lisinopril (PRINIVIL,ZESTRIL) 20 MG tablet TAKE 1 TABLET DAILY 30 tablet 2  . metoprolol succinate (TOPROL-XL) 25 MG 24 hr tablet TAKE 1 TABLET DAILY 30 tablet 2   No current facility-administered medications for this visit.     Allergies as of 06/04/2016 - Review Complete 06/04/2016  Allergen Reaction Noted  . Coreg [  carvedilol] Swelling 12/27/2015    Family History  Problem Relation Age of Onset  . Breast cancer Mother   . Depression Mother   . Diabetes Mother   . Hearing loss Mother   . Hyperlipidemia Mother   . Hypertension Mother   . Mental illness Mother   . Vision loss Mother   . Alcohol abuse Father   . Arthritis Father   . Early death Father   . Hyperlipidemia Father   . Hypertension Father   . Vision loss Father   . Colon cancer Paternal Grandmother   . Liver cancer Cousin   . Colon polyps Neg Hx     Social History   Social History  . Marital status: Legally Separated    Spouse name: N/A  . Number of children: N/A  . Years of education: N/A   Social History Main Topics  . Smoking  status: Current Every Day Smoker    Packs/day: 0.50    Years: 40.00    Types: Cigarettes  . Smokeless tobacco: Never Used  . Alcohol use 2.4 oz/week    4 Shots of liquor per week  . Drug use: No  . Sexual activity: No   Other Topics Concern  . None   Social History Narrative  . None    Review of Systems: As mentioned in HPI.   Physical Exam: BP 120/72   Pulse 82   Temp 97 F (36.1 C) (Oral)   Ht  (1.727 m)   Wt 258 lb (117 kg)   BMI 39.23 kg/m  General:   Alert and oriented. No distress noted. Pleasant and cooperative.  Head:  Normocephalic and atraumatic. Eyes:  Conjuctiva clear without scleral icterus. Heart:  S1, S2 present without murmurs Abdomen:  +BS, soft, obese, large umbilical hernia with ulceration inferiorly  Msk:  Symmetrical without gross deformities. Normal posture. Extremities:  Without edema. Neurologic:  Alert and  oriented x4;  grossly normal neurologically. Psych:  Alert and cooperative. Normal mood and affect.  Lab Results  Component Value Date   WBC 6.1 04/03/2016   HGB 12.3 (L) 04/03/2016   HCT 38.0 (L) 04/03/2016   MCV 92.9 04/03/2016   PLT 193 04/03/2016   Lab Results  Component Value Date   ALT 9 01/28/2016   AST 21 01/28/2016   ALKPHOS 105 01/28/2016   BILITOT 0.4 01/28/2016   Lab Results  Component Value Date   CREATININE 1.08 04/03/2016   BUN 14 04/03/2016   NA 134 (L) 04/03/2016   K 5.1 04/03/2016   CL 103 04/03/2016   CO2 24 04/03/2016

## 2016-06-10 ENCOUNTER — Encounter: Payer: Self-pay | Admitting: Gastroenterology

## 2016-06-10 NOTE — Patient Instructions (Signed)
Russell Trujillo  06/10/2016     @PREFPERIOPPHARMACY @   Your procedure is scheduled on  06/16/2016   [facility] at  900 A.M.  Call this number if you have problems the morning of surgery:  [phone]   Remember:  Do not eat food or drink liquids after midnight.  Take these medicines the morning of surgery with A SIP OF WATER  Prozac, lisinopril, metoprolol.   Do not wear jewelry, make-up or nail polish.  Do not wear lotions, powders, or perfumes.  You may wear deoderant.  Do not shave 48 hours prior to surgery.  Men may shave face and neck.  Do not bring valuables to the hospital.  [facility] is not responsible for any belongings or valuables.  Contacts, dentures or bridgework may not be worn into surgery.  Leave your suitcase in the car.  After surgery it may be brought to your room.  For patients admitted to the hospital, discharge time will be determined by your treatment team.  Patients discharged the day of surgery will not be allowed to drive home.   Name and phone number of your driver:   family Special instructions:  Follow the diet instructions given to you by Dr Evelina DunField's office.  Please read over the following fact sheets that you were given. Anesthesia Post-op Instructions and Care and Recovery After Surgery      Esophagogastroduodenoscopy Esophagogastroduodenoscopy (EGD) is a procedure that is used to examine the lining of the esophagus, stomach, and first part of the small intestine (duodenum). A long, flexible, lighted tube with a camera attached (endoscope) is inserted down the throat to view these organs. This procedure is done to detect problems or abnormalities, such as inflammation, bleeding, ulcers, or growths, in order to treat them. The procedure lasts 5-20 minutes. It is usually an outpatient procedure, but it may need to be performed in a hospital in emergency cases. LET [facility] CARE PROVIDER KNOW ABOUT:  Any allergies you  have.  All medicines you are taking, including vitamins, herbs, eye drops, creams, and over-the-counter medicines.  Previous problems you or members of your family have had with the use of anesthetics.  Any blood disorders you have.  Previous surgeries you have had.  Medical conditions you have. RISKS AND COMPLICATIONS Generally, this is a safe procedure. However, problems can occur and include:  Infection.  Bleeding.  Tearing (perforation) of the esophagus, stomach, or duodenum.  Difficulty breathing or not being able to breathe.  Excessive sweating.  Spasms of the larynx.  Slowed heartbeat.  Low blood pressure. BEFORE THE PROCEDURE  Do not eat or drink anything after midnight on the night before the procedure or as directed by your health care provider.  Do not take your regular medicines before the procedure if your health care provider asks you not to. Ask your health care provider about changing or stopping those medicines.  If you wear dentures, be prepared to remove them before the procedure.  Arrange for someone to drive you home after the procedure. PROCEDURE  A numbing medicine (local anesthetic) may be sprayed in your throat for comfort and to stop you from gagging or coughing.  You will have an IV tube inserted in a vein in your hand or arm. You will receive medicines and fluids through this tube.  You will be given a medicine to relax you (sedative).  A pain reliever will be given through the  IV tube.  A mouth guard may be placed in your mouth to protect your teeth and to keep you from biting on the endoscope.  You will be asked to lie on your left side.  The endoscope will be inserted down your throat and into your esophagus, stomach, and duodenum.  Air will be put through the endoscope to allow your health care provider to clearly view the lining of your esophagus.  The lining of your esophagus, stomach, and duodenum will be examined. During the  exam, your health care provider may:  Remove tissue to be examined under a microscope (biopsy) for inflammation, infection, or other medical problems.  Remove growths.  Remove objects (foreign bodies) that are stuck.  Treat any bleeding with medicines or other devices that stop tissues from bleeding (hot cautery, clipping devices).  Widen (dilate) or stretch narrowed areas of your esophagus and stomach.  The endoscope will be withdrawn. AFTER THE PROCEDURE  You will be taken to a recovery area for observation. Your blood pressure, heart rate, breathing rate, and blood oxygen level will be monitored often until the medicines you were given have worn off.  Do not eat or drink anything until the numbing medicine has worn off and your gag reflex has returned. You may choke.  Your health care provider should be able to discuss his or her findings with you. It will take longer to discuss the test results if any biopsies were taken.   This information is not intended to replace advice given to you by your health care provider. Make sure you discuss any questions you have with your health care provider.   Document Released: 02/12/2005 Document Revised: 11/02/2014 Document Reviewed: 09/14/2012 Elsevier Interactive Patient Education 2016 Elsevier Inc. Esophagogastroduodenoscopy, Care After Refer to this sheet in the next few weeks. These instructions provide you with information about caring for yourself after your procedure. Your health care provider may also give you more specific instructions. Your treatment has been planned according to current medical practices, but problems sometimes occur. Call your health care provider if you have any problems or questions after your procedure. WHAT TO EXPECT AFTER THE PROCEDURE After your procedure, it is typical to feel:  Soreness in your throat.  Pain with swallowing.  Sick to your stomach (nauseous).  Bloated.  Dizzy.  Fatigued. HOME CARE  INSTRUCTIONS  Do not eat or drink anything until the numbing medicine (local anesthetic) has worn off and your gag reflex has returned. You will know that the local anesthetic has worn off when you can swallow comfortably.  Do not drive or operate machinery until directed by your health care provider.  Take medicines only as directed by your health care provider. SEEK MEDICAL CARE IF:   You cannot stop coughing.  You are not urinating at all or less than usual. SEEK IMMEDIATE MEDICAL CARE IF:  You have difficulty swallowing.  You cannot eat or drink.  You have worsening throat or chest pain.  You have dizziness or lightheadedness or you faint.  You have nausea or vomiting.  You have chills.  You have a fever.  You have severe abdominal pain.  You have black, tarry, or bloody stools.   This information is not intended to replace advice given to you by your health care provider. Make sure you discuss any questions you have with your health care provider.   Document Released: 09/28/2012 Document Revised: 11/02/2014 Document Reviewed: 09/28/2012 Elsevier Interactive Patient Education 2016 [employer]. Monitored Anesthesia Care  Monitored anesthesia care is an anesthesia service for a medical procedure. Anesthesia is the loss of the ability to feel pain. It is produced by medicines called anesthetics. It may affect a small area of your body (local anesthesia), a large area of your body (regional anesthesia), or your entire body (general anesthesia). The need for monitored anesthesia care depends your procedure, your condition, and the potential need for regional or general anesthesia. It is often provided during procedures where:   General anesthesia may be needed if there are complications. This is because you need special care when you are under general anesthesia.   You will be under local or regional anesthesia. This is so that you are able to have higher levels of  anesthesia if needed.   You will receive calming medicines (sedatives). This is especially the case if sedatives are given to put you in a semi-conscious state of relaxation (deep sedation). This is because the amount of sedative needed to produce this state can be hard to predict. Too much of a sedative can produce general anesthesia. Monitored anesthesia care is performed by one or more health care providers who have special training in all types of anesthesia. You will need to meet with these health care providers before your procedure. During this meeting, they will ask you about your medical history. They will also give you instructions to follow. (For example, you will need to stop eating and drinking before your procedure. You may also need to stop or change medicines you are taking.) During your procedure, your health care providers will stay with you. They will:   Watch your condition. This includes watching your blood pressure, breathing, and level of pain.   Diagnose and treat problems that occur.   Give medicines if they are needed. These may include calming medicines (sedatives) and anesthetics.   Make sure you are comfortable.  Having monitored anesthesia care does not necessarily mean that you will be under anesthesia. It does mean that your health care providers will be able to manage anesthesia if you need it or if it occurs. It also means that you will be able to have a different type of anesthesia than you are having if you need it. When your procedure is complete, your health care providers will continue to watch your condition. They will make sure any medicines wear off before you are allowed to go home.    This information is not intended to replace advice given to you by your health care provider. Make sure you discuss any questions you have with your health care provider.   Document Released: 07/08/2005 Document Revised: 11/02/2014 Document Reviewed: 11/23/2012 Elsevier  Interactive Patient Education 2016 Elsevier Inc. PATIENT INSTRUCTIONS POST-ANESTHESIA  IMMEDIATELY FOLLOWING SURGERY:  Do not drive or operate machinery for the first twenty four hours after surgery.  Do not make any important decisions for twenty four hours after surgery or while taking narcotic pain medications or sedatives.  If you develop intractable nausea and vomiting or a severe headache please notify your doctor immediately.  FOLLOW-UP:  Please make an appointment with your surgeon as instructed. You do not need to follow up with anesthesia unless specifically instructed to do so.  WOUND CARE INSTRUCTIONS (if applicable):  Keep a dry clean dressing on the anesthesia/puncture wound site if there is drainage.  Once the wound has quit draining you may leave it open to air.  Generally you should leave the bandage intact for twenty four hours unless there is  drainage.  If the epidural site drains for more than 36-48 hours please call the anesthesia department.  QUESTIONS?:  Please feel free to call your physician or the hospital operator if you have any questions, and they will be happy to assist you.

## 2016-06-10 NOTE — Assessment & Plan Note (Addendum)
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 Has been non-compliant with salt intake and has lapsed with recurrent intermittent ETOH intake. Non-compliant with diuretic dosing, as it is painful to urinate at times with known colovesical fistula. [facility] appointment upcoming in near future for evaluation with surgeon for fistula. Colonoscopy next due in 2019.   Needs initial EGD for variceal screening, along with strict low sodium diet, ETOH cessation, and further evaluation with Hep C antibody, Hep B surface antigen, CMP, CBC, and INR. Likely dealing with ETOH cirrhosis. Last LFTs normal. Will hold off on any further serologies unless indicated. Proceed with US abdomen for HCC screening as well. 3 month return.   Proceed with upper endoscopy in the near future with Dr. Darrick PennaFields. The risks, benefits, and alternatives have been discussed in detail with patient. They have stated understanding and desire to proceed.  WITH PROPOFOL due to history of ETOH use.

## 2016-06-11 ENCOUNTER — Encounter (HOSPITAL_COMMUNITY)
Admission: RE | Admit: 2016-06-11 | Discharge: 2016-06-11 | Disposition: A | Discharge status: Home / Self Care | Payer: Medicaid Other | Source: Ambulatory Visit | Attending: Gastroenterology | Admitting: Gastroenterology

## 2016-06-11 NOTE — Progress Notes (Signed)
cc'ed to pcp °

## 2016-06-15 ENCOUNTER — Other Ambulatory Visit: Payer: Self-pay

## 2016-06-15 ENCOUNTER — Telehealth: Payer: Self-pay | Admitting: Gastroenterology

## 2016-06-15 ENCOUNTER — Encounter (HOSPITAL_COMMUNITY)
Admission: RE | Admit: 2016-06-15 | Discharge: 2016-06-15 | Disposition: A | Discharge status: Home / Self Care | Payer: Medicaid Other | Source: Ambulatory Visit | Attending: Gastroenterology | Admitting: Gastroenterology

## 2016-06-15 ENCOUNTER — Encounter (HOSPITAL_COMMUNITY): Payer: Self-pay | Admitting: Anesthesiology

## 2016-06-15 ENCOUNTER — Encounter (HOSPITAL_COMMUNITY): Payer: Self-pay

## 2016-06-15 DIAGNOSIS — Z01812 Encounter for preprocedural laboratory examination: Secondary | ICD-10-CM | POA: Diagnosis not present

## 2016-06-15 HISTORY — DX: Other specified disorders of the skin and subcutaneous tissue: L98.8

## 2016-06-15 LAB — CBC WITH DIFFERENTIAL/PLATELET
BASOS ABS: 0.1 10*3/uL (ref 0.0–0.1)
Basophils Relative: 1 %
EOS PCT: 2 %
Eosinophils Absolute: 0.2 10*3/uL (ref 0.0–0.7)
HEMATOCRIT: 40.2 % (ref 39.0–52.0)
HEMOGLOBIN: 13.1 g/dL (ref 13.0–17.0)
LYMPHS ABS: 1.1 10*3/uL (ref 0.7–4.0)
LYMPHS PCT: 11 %
MCH: 31.3 pg (ref 26.0–34.0)
MCHC: 32.6 g/dL (ref 30.0–36.0)
MCV: 96.2 fL (ref 78.0–100.0)
Monocytes Absolute: 1.1 10*3/uL — ABNORMAL HIGH (ref 0.1–1.0)
Monocytes Relative: 11 %
NEUTROS ABS: 7.4 10*3/uL (ref 1.7–7.7)
NEUTROS PCT: 75 %
PLATELETS: 177 10*3/uL (ref 150–400)
RBC: 4.18 MIL/uL — AB (ref 4.22–5.81)
RDW: 16.1 % — ABNORMAL HIGH (ref 11.5–15.5)
WBC: 9.8 10*3/uL (ref 4.0–10.5)

## 2016-06-15 LAB — BASIC METABOLIC PANEL
ANION GAP: 6 (ref 5–15)
BUN: 17 mg/dL (ref 6–20)
CHLORIDE: 102 mmol/L (ref 101–111)
CO2: 25 mmol/L (ref 22–32)
Calcium: 9.5 mg/dL (ref 8.9–10.3)
Creatinine, Ser: 1.18 mg/dL (ref 0.61–1.24)
GFR calc Af Amer: >60 mL/min (ref >60)
Glucose, Bld: 116 mg/dL — ABNORMAL HIGH (ref 65–99)
POTASSIUM: 4.5 mmol/L (ref 3.5–5.1)
SODIUM: 133 mmol/L — AB (ref 135–145)

## 2016-06-15 NOTE — Anesthesia Preprocedure Evaluation (Deleted)
Anesthesia Evaluation  Patient identified by MRN, date of birth, ID band Patient awake    Reviewed: Allergy & Precautions, NPO status , Patient's Chart, lab work & pertinent test results, reviewed documented beta blocker date and time   Airway        Dental   Pulmonary shortness of breath and with exertion, Current Smoker,           Cardiovascular hypertension, Pt. on medications and Pt. on home beta blockers + dysrhythmias + pacemaker + Cardiac Defibrillator   EKG- 06/15/2016 Atrial sensed, Biventricular paced rhythm   Neuro/Psych PSYCHIATRIC DISORDERS Depression  Neuromuscular disease    GI/Hepatic GERD  Medicated and Controlled,(+) Hepatitis -, BScreening for Esophageal varices   Endo/Other  Morbid obesityHyperlipidemia Gout  Renal/GU negative Renal ROS   Hx/o colovesical fistula    Musculoskeletal Hx/o aseptic necrosis right hip   Abdominal   Peds  Hematology   Anesthesia Other Findings   Reproductive/Obstetrics                             Anesthesia Physical Anesthesia Plan  ASA: III  Anesthesia Plan: MAC   Post-op Pain Management:    Induction:   Airway Management Planned: Natural Airway and Nasal Cannula  Additional Equipment:   Intra-op Plan:   Post-operative Plan:   Informed Consent: I have reviewed the patients History and Physical, chart, labs and discussed the procedure including the risks, benefits and alternatives for the proposed anesthesia with the patient or authorized representative who has indicated his/her understanding and acceptance.     Plan Discussed with: Anesthesiologist, CRNA and Surgeon  Anesthesia Plan Comments: (Have magnet in room in case of electrocautery use.)        Anesthesia Quick Evaluation

## 2016-06-15 NOTE — Telephone Encounter (Signed)
Ultrasound September   hcc screening

## 2016-06-15 NOTE — Telephone Encounter (Signed)
Pt is set up for US on 07/01/16 @ 9:30. Letter in the mail.

## 2016-06-16 ENCOUNTER — Ambulatory Visit (HOSPITAL_COMMUNITY): Admission: RE | Admit: 2016-06-16 | Payer: Medicaid Other | Source: Ambulatory Visit | Admitting: Gastroenterology

## 2016-06-16 ENCOUNTER — Encounter (HOSPITAL_COMMUNITY): Admission: RE | Payer: Self-pay | Source: Ambulatory Visit

## 2016-06-16 LAB — COMPREHENSIVE METABOLIC PANEL
A/G RATIO: 1 — AB (ref 1.2–2.2)
ALT: 18 IU/L (ref 0–44)
AST: 28 IU/L (ref 0–40)
Albumin: 3.7 g/dL (ref 3.5–5.5)
Alkaline Phosphatase: 104 IU/L (ref 39–117)
BILIRUBIN TOTAL: 0.5 mg/dL (ref 0.0–1.2)
BUN/Creatinine Ratio: 13 (ref 9–20)
BUN: 15 mg/dL (ref 6–24)
CHLORIDE: 98 mmol/L (ref 96–106)
CO2: 23 mmol/L (ref 18–29)
Calcium: 10 mg/dL (ref 8.7–10.2)
Creatinine, Ser: 1.15 mg/dL (ref 0.76–1.27)
GFR calc non Af Amer: 71 mL/min/{1.73_m2} (ref >59)
GFR, EST AFRICAN AMERICAN: 82 mL/min/{1.73_m2} (ref >59)
GLOBULIN, TOTAL: 3.6 g/dL (ref 1.5–4.5)
Glucose: 115 mg/dL — ABNORMAL HIGH (ref 65–99)
POTASSIUM: 4.7 mmol/L (ref 3.5–5.2)
SODIUM: 137 mmol/L (ref 134–144)
TOTAL PROTEIN: 7.3 g/dL (ref 6.0–8.5)

## 2016-06-16 LAB — CBC
Hematocrit: 39.5 % (ref 37.5–51.0)
Hemoglobin: 13.2 g/dL (ref 12.6–17.7)
MCH: 31.4 pg (ref 26.6–33.0)
MCHC: 33.4 g/dL (ref 31.5–35.7)
MCV: 94 fL (ref 79–97)
PLATELETS: 224 10*3/uL (ref 150–379)
RBC: 4.2 x10E6/uL (ref 4.14–5.80)
RDW: 15.9 % — AB (ref 12.3–15.4)
WBC: 9.5 10*3/uL (ref 3.4–10.8)

## 2016-06-16 LAB — HEPATITIS B SURFACE ANTIGEN: Hepatitis B Surface Ag: NEGATIVE

## 2016-06-16 LAB — PROTIME-INR
INR: 1 (ref 0.8–1.2)
Prothrombin Time: 10.6 s (ref 9.1–12.0)

## 2016-06-16 LAB — HEPATITIS C ANTIBODY: Hep C Virus Ab: <0.1 s/co ratio (ref 0.0–0.9)

## 2016-06-16 SURGERY — ESOPHAGOGASTRODUODENOSCOPY (EGD) WITH PROPOFOL
Anesthesia: Monitor Anesthesia Care

## 2016-06-19 ENCOUNTER — Encounter: Payer: Self-pay | Admitting: Family Medicine

## 2016-06-19 ENCOUNTER — Ambulatory Visit (INDEPENDENT_AMBULATORY_CARE_PROVIDER_SITE_OTHER): Payer: Medicaid Other | Admitting: Family Medicine

## 2016-06-19 VITALS — BP 118/79 | HR 85 | Temp 96.9°F | Ht 68.0 in | Wt 262.2 lb

## 2016-06-19 DIAGNOSIS — K439 Ventral hernia without obstruction or gangrene: Secondary | ICD-10-CM | POA: Diagnosis not present

## 2016-06-19 DIAGNOSIS — I10 Essential (primary) hypertension: Secondary | ICD-10-CM | POA: Diagnosis not present

## 2016-06-19 DIAGNOSIS — E785 Hyperlipidemia, unspecified: Secondary | ICD-10-CM

## 2016-06-19 NOTE — Assessment & Plan Note (Signed)
Controlled continue current medications. 

## 2016-06-19 NOTE — Assessment & Plan Note (Signed)
Patient still has not got into surgeon, he will give them a call again and then let us know if he needs a new referral

## 2016-06-19 NOTE — Assessment & Plan Note (Signed)
Gave written order for lipid panel recheck that he can take with the next time he gets his labs done.

## 2016-06-19 NOTE — Progress Notes (Signed)
BP 118/79 (BP Location: Left Arm, Patient Position: Sitting, Cuff Size: Large)   Pulse 85   Temp (!) 96.9 F (36.1 C) (Oral)   Ht 5\' 8"  (1.727 m)   Wt 262 lb 3.2 oz (118.9 kg)   BMI 39.87 kg/m    Subjective:    Patient ID: Russell Trujillo, male    DOB: 1961/08/17, 55 y.o.   MRN: [id number]  HPI: Russell Trujillo is a 55 y.o. male presenting on 06/19/2016 for Hyperlipidemia (3 month followup, patient is fasting) and Hypertension   HPI Hypertension recheck Patient is coming in for hypertension recheck today. His blood pressure today is 118/79. Patient is currently on lisinopril 20 mg and metoprolol 25 mg. Patient denies headaches, blurred vision, chest pains, shortness of breath, or weakness. Denies any side effects from medication and is content with current medication.   Hyperlipidemia recheck Patient is currently on fenofibrate 145 mg. He denies any issues with medication or myalgias. She has not had a cholesterol check in some time, he usually gets his labs checked for his other physician, will do a written prescription for him to check a lipid panel.  Hernia  Patient still has not had not gotten into the surgeon yet. He will call again and let us know if he needs a new referral  Relevant past medical, surgical, family and social history reviewed and updated as indicated. Interim medical history since our last visit reviewed. Allergies and medications reviewed and updated.  Review of Systems  Constitutional: Negative for chills and fever.  HENT: Negative for ear discharge and ear pain.   Eyes: Negative for discharge and visual disturbance.  Respiratory: Negative for shortness of breath and wheezing.   Cardiovascular: Negative for chest pain and leg swelling.  Gastrointestinal: Positive for abdominal distention and abdominal pain. Negative for constipation and diarrhea.  Endocrine: Negative for cold intolerance and heat intolerance.  Genitourinary: Negative for difficulty urinating.    Musculoskeletal: Negative for back pain and gait problem.  Skin: Negative for rash.  Neurological: Negative for syncope, light-headedness and headaches.  All other systems reviewed and are negative.  Per HPI unless specifically indicated above    Medication List       Accurate as of 06/19/16  8:26 AM. Always use your most recent med list.          aspirin 325 MG tablet Take 325 mg by mouth daily.   diphenhydrAMINE 25 MG tablet Commonly known as:  SOMINEX Take 25 mg by mouth at bedtime as needed for sleep.   fenofibrate 145 MG tablet Commonly known as:  TRICOR Take 1 tablet (145 mg total) by mouth daily.   FLUoxetine 10 MG capsule Commonly known as:  PROZAC Take 10 mg by mouth daily.   ibuprofen 800 MG tablet Commonly known as:  ADVIL,MOTRIN Take 800 mg by mouth every 8 (eight) hours as needed for moderate pain.   lisinopril 20 MG tablet Commonly known as:  PRINIVIL,ZESTRIL TAKE 1 TABLET DAILY   metoprolol succinate 25 MG 24 hr tablet Commonly known as:  TOPROL-XL TAKE 1 TABLET DAILY          Objective:    BP 118/79 (BP Location: Left Arm, Patient Position: Sitting, Cuff Size: Large)   Pulse 85   Temp (!) 96.9 F (36.1 C) (Oral)   Ht 5\' 8"  (1.727 m)   Wt 262 lb 3.2 oz (118.9 kg)   BMI 39.87 kg/m   Wt Readings from Last 3 Encounters:  06/19/16 262  lb 3.2 oz (118.9 kg)  06/15/16 261 lb (118.4 kg)  06/04/16 258 lb (117 kg)    Physical Exam  Constitutional: He is oriented to person, place, and time. He appears well-developed and well-nourished. No distress.  Eyes: Conjunctivae and EOM are normal. Pupils are equal, round, and reactive to light. Right eye exhibits no discharge. No scleral icterus.  Neck: Neck supple. No thyromegaly present.  Cardiovascular: Normal rate, regular rhythm, normal heart sounds and intact distal pulses.   No murmur heard. Pulmonary/Chest: Effort normal and breath sounds normal. No respiratory distress. He has no wheezes.   Abdominal: Soft. Normal appearance and bowel sounds are normal. He exhibits distension and ascites. There is no tenderness. There is no rebound. A hernia is present. Hernia confirmed positive in the ventral area.  Musculoskeletal: Normal range of motion. He exhibits no edema.  Lymphadenopathy:    He has no cervical adenopathy.  Neurological: He is alert and oriented to person, place, and time. Coordination normal.  Skin: Skin is warm and dry. No rash noted. He is not diaphoretic.  Psychiatric: He has a normal mood and affect. His behavior is normal.  Nursing note and vitals reviewed.   Results for orders placed or performed during the hospital encounter of 06/15/16  Basic metabolic panel  Result Value Ref Range   Sodium 133 (L) 135 - 145 mmol/L   Potassium 4.5 3.5 - 5.1 mmol/L   Chloride 102 101 - 111 mmol/L   CO2 25 22 - 32 mmol/L   Glucose, Bld 116 (H) 65 - 99 mg/dL   BUN 17 6 - 20 mg/dL   Creatinine, Ser 1.611.18 0.61 - 1.24 mg/dL   Calcium 9.5 8.9 - 09.610.3 mg/dL   GFR calc non Af Amer >60 >60 mL/min   GFR calc Af Amer >60 >60 mL/min   Anion gap 6 5 - 15  CBC with Differential/Platelet  Result Value Ref Range   WBC 9.8 4.0 - 10.5 K/uL   RBC 4.18 (L) 4.22 - 5.81 MIL/uL   Hemoglobin 13.1 13.0 - 17.0 g/dL   HCT 04.540.2 40.939.0 - 81.152.0 %   MCV 96.2 78.0 - 100.0 fL   MCH 31.3 26.0 - 34.0 pg   MCHC 32.6 30.0 - 36.0 g/dL   RDW 91.416.1 (H) 78.211.5 - 95.615.5 %   Platelets 177 150 - 400 K/uL   Neutrophils Relative % 75 %   Neutro Abs 7.4 1.7 - 7.7 K/uL   Lymphocytes Relative 11 %   Lymphs Abs 1.1 0.7 - 4.0 K/uL   Monocytes Relative 11 %   Monocytes Absolute 1.1 (H) 0.1 - 1.0 K/uL   Eosinophils Relative 2 %   Eosinophils Absolute 0.2 0.0 - 0.7 K/uL   Basophils Relative 1 %   Basophils Absolute 0.1 0.0 - 0.1 K/uL      Assessment & Plan:   Problem List Items Addressed This Visit      Cardiovascular and Mediastinum   Essential hypertension, benign - Primary    Controlled continue current  medications        Other   Hyperlipidemia LDL goal <130    Gave written order for lipid panel recheck that he can take with the next time he gets his labs done.      Ventral hernia    Patient still has not got into surgeon, he will give them a call again and then let us know if he needs a new referral       Other Visit  Diagnoses   None.      Follow up plan: Return in about 6 months (around 12/20/2016), or if symptoms worsen or fail to improve, for Recheck hypertension and cholesterol.  Counseling provided for all of the vaccine components No orders of the defined types were placed in this encounter.   Arville Care, MD [facility] 06/19/2016, 8:26 AM

## 2016-06-30 NOTE — Progress Notes (Signed)
Hep B and C are negative. MELD Na score is 8. US abdomen upcoming. Needs EGD for screening. Looks like he cancelled this?

## 2016-07-01 ENCOUNTER — Telehealth: Payer: Self-pay

## 2016-07-01 ENCOUNTER — Ambulatory Visit (HOSPITAL_COMMUNITY): Admission: RE | Admit: 2016-07-01 | Payer: Medicaid Other | Source: Ambulatory Visit

## 2016-07-01 NOTE — Telephone Encounter (Signed)
Gastroenterology Pre-Procedure Review  Request Date: 07/01/2016 Requesting Physician: Lewie LoronAnna Boone, NP  PATIENT REVIEW QUESTIONS: The patient responded to the following health history questions as indicated:    1. Diabetes Melitis: no 2. Joint replacements in the past 12 months: no 3. Major health problems in the past 3 months: no 4. Has an artificial valve or MVP: no 5. Has a defibrillator:PT HAS AN AICD DEVICE 6. Has been advised in past to take antibiotics in advance of a procedure like teeth cleaning: no 7. Family history of colon cancer: no  8. Alcohol Use: no 9. History of sleep apnea: no     MEDICATIONS & ALLERGIES:    Patient reports the following regarding taking any blood thinners:   Plavix? no Aspirin? Yes Coumadin? no  Patient confirms/reports the following medications:  Current Outpatient Prescriptions  Medication Sig Dispense Refill  . aspirin 325 MG tablet Take 325 mg by mouth daily.    . diphenhydrAMINE (SOMINEX) 25 MG tablet Take 25 mg by mouth at bedtime as needed for sleep.    . fenofibrate (TRICOR) 145 MG tablet Take 1 tablet (145 mg total) by mouth daily. 90 tablet 1  . FLUoxetine (PROZAC) 10 MG capsule Take 10 mg by mouth daily.    Marland Kitchen. ibuprofen (ADVIL,MOTRIN) 800 MG tablet Take 800 mg by mouth every 8 (eight) hours as needed for moderate pain.     Marland Kitchen. lisinopril (PRINIVIL,ZESTRIL) 20 MG tablet TAKE 1 TABLET DAILY 30 tablet 2  . metoprolol succinate (TOPROL-XL) 25 MG 24 hr tablet TAKE 1 TABLET DAILY 30 tablet 2   No current facility-administered medications for this visit.     Patient confirms/reports the following allergies:  Allergies  Allergen Reactions  . Coreg [Carvedilol] Swelling    Swelling of lips    No orders of the defined types were placed in this encounter.   AUTHORIZATION INFORMATION Primary Insurance:   ID #:   Group #:  Pre-Cert / Auth required:  Pre-Cert / Auth #:   Secondary Insurance:   ID #:  Group #:  Pre-Cert / Auth required:   Pre-Cert / Auth #:   SCHEDULE INFORMATION: Procedure has been scheduled as follows:  Date:            Time:   Location:   This Gastroenterology Pre-Precedure Review Form is being routed to the following provider(s): Jonette EvaSandi Fields, MD

## 2016-07-01 NOTE — Progress Notes (Signed)
Pt is aware. Said he had to cancel his EGD because his mom has been real sick. I have triaged for the EGD and will send it to you separately.  He thinks he has appt for US and will call and let me know.

## 2016-07-06 NOTE — Telephone Encounter (Signed)
Needs EGD with Propofol.

## 2016-07-13 ENCOUNTER — Telehealth: Payer: Self-pay

## 2016-07-13 ENCOUNTER — Other Ambulatory Visit: Payer: Self-pay

## 2016-07-13 DIAGNOSIS — R188 Other ascites: Secondary | ICD-10-CM

## 2016-07-13 NOTE — Telephone Encounter (Signed)
Pt called and left message. He is requesting to have a Paracentesis done. Message forwarded to AB.

## 2016-07-13 NOTE — Telephone Encounter (Signed)
Yes. 25 g Albumin IV at 4 liters.

## 2016-07-13 NOTE — Telephone Encounter (Signed)
Called pt and informed of Paracentesis scheduled for tomorrow (07/14/16) at 12:00.

## 2016-07-14 ENCOUNTER — Other Ambulatory Visit: Payer: Self-pay

## 2016-07-14 ENCOUNTER — Ambulatory Visit (HOSPITAL_COMMUNITY): Admission: RE | Admit: 2016-07-14 | Payer: Medicaid Other | Source: Ambulatory Visit

## 2016-07-14 DIAGNOSIS — I8500 Esophageal varices without bleeding: Secondary | ICD-10-CM

## 2016-07-14 NOTE — Telephone Encounter (Signed)
PT is scheduled for 07/21/2016 at 1:00 PM for the EGD with Dr. Darrick PennaFields. His pre-op is for 07/17/2016 at 8:00 Am. Pt is aware of both and instructions mailed to pt.

## 2016-07-16 NOTE — Patient Instructions (Signed)
20    Your procedure is scheduled on: 07/21/2016  Report to [facility] at   11:30  AM.  Call this number if you have problems the morning of surgery: [phone]   Remember:   Do not drink or eat food:After Midnight.    Clear liquids include soda, tea, black coffee, apple or grape juice, broth.  Take these medicines the morning of surgery with A SIP OF WATER: Protonix, lisinopril and Metoprolol   Do not wear jewelry, make-up or nail polish.  Do not wear lotions, powders, or perfumes. You may wear deodorant.  Do not shave 48 hours prior to surgery. Men may shave face and neck.  Do not bring valuables to the hospital.  Contacts, dentures or bridgework may not be worn into surgery.  Leave suitcase in the car. After surgery it may be brought to your room.  For patients admitted to the hospital, checkout time is 11:00 AM the day of discharge.   Patients discharged the day of surgery will not be allowed to drive home.  Name and phone number of your driver:    Please read over the following fact sheets that you were given: Pain Booklet, Lab Information and Anesthesia Post-op Instructions   Endoscopy Care After Please read the instructions outlined below and refer to this sheet in the next few weeks. These discharge instructions provide you with general information on caring for yourself after you leave the hospital. Your doctor may also give you specific instructions. While your treatment has been planned according to the most current medical practices available, unavoidable complications occasionally occur. If you have any problems or questions after discharge, please call your doctor. HOME CARE INSTRUCTIONS Activity  You may resume your regular activity but move at a slower pace for the next 24 hours.   Take frequent rest periods for the next 24 hours.   Walking will help expel (get rid of) the air and reduce the bloated feeling in your abdomen.   No driving for 24 hours (because of the  anesthesia (medicine) used during the test).   You may shower.   Do not sign any important legal documents or operate any machinery for 24 hours (because of the anesthesia used during the test).  Nutrition  Drink plenty of fluids.   You may resume your normal diet.   Begin with a light meal and progress to your normal diet.   Avoid alcoholic beverages for 24 hours or as instructed by your caregiver.  Medications You may resume your normal medications unless your caregiver tells you otherwise. What you can expect today  You may experience abdominal discomfort such as a feeling of fullness or "gas" pains.   You may experience a sore throat for 2 to 3 days. This is normal. Gargling with salt water may help this.  Follow-up Your doctor will discuss the results of your test with you. SEEK IMMEDIATE MEDICAL CARE IF:  You have excessive nausea (feeling sick to your stomach) and/or vomiting.   You have severe abdominal pain and distention (swelling).   You have trouble swallowing.   You have a temperature over 100 F (37.8 C).   You have rectal bleeding or vomiting of blood.  Document Released: 05/26/2004 Document Revised: 10/01/2011 Document Reviewed: 12/07/2007

## 2016-07-17 ENCOUNTER — Encounter (HOSPITAL_COMMUNITY)
Admission: RE | Admit: 2016-07-17 | Discharge: 2016-07-17 | Disposition: A | Discharge status: Home / Self Care | Payer: Medicaid Other | Source: Ambulatory Visit | Attending: Gastroenterology | Admitting: Gastroenterology

## 2016-07-17 ENCOUNTER — Ambulatory Visit (HOSPITAL_COMMUNITY): Admission: RE | Admit: 2016-07-17 | Payer: Medicaid Other | Source: Ambulatory Visit

## 2016-07-20 ENCOUNTER — Encounter (HOSPITAL_COMMUNITY): Admission: RE | Admit: 2016-07-20 | Payer: Medicaid Other | Source: Ambulatory Visit

## 2016-07-20 ENCOUNTER — Telehealth: Payer: Self-pay

## 2016-07-20 ENCOUNTER — Encounter (HOSPITAL_COMMUNITY): Payer: Self-pay

## 2016-07-20 NOTE — Telephone Encounter (Signed)
Pt called to cancel his EGD because he can not find anyone to drive him and his mother does not drive

## 2016-07-20 NOTE — Telephone Encounter (Signed)
FYI to Dr. Fields.  

## 2016-07-20 NOTE — Telephone Encounter (Signed)
REVIEWED-NO ADDITIONAL RECOMMENDATIONS. 

## 2016-07-21 ENCOUNTER — Encounter (HOSPITAL_COMMUNITY): Admission: RE | Payer: Self-pay | Source: Ambulatory Visit

## 2016-07-21 ENCOUNTER — Ambulatory Visit (HOSPITAL_COMMUNITY): Admission: RE | Admit: 2016-07-21 | Payer: Medicaid Other | Source: Ambulatory Visit | Admitting: Gastroenterology

## 2016-07-21 SURGERY — ESOPHAGOGASTRODUODENOSCOPY (EGD) WITH PROPOFOL
Anesthesia: Monitor Anesthesia Care

## 2016-07-22 ENCOUNTER — Encounter (HOSPITAL_COMMUNITY): Payer: Self-pay

## 2016-07-22 ENCOUNTER — Ambulatory Visit (HOSPITAL_COMMUNITY)
Admission: RE | Admit: 2016-07-22 | Discharge: 2016-07-22 | Disposition: A | Discharge status: Home / Self Care | Payer: Medicaid Other | Source: Ambulatory Visit | Attending: Gastroenterology | Admitting: Gastroenterology

## 2016-07-22 DIAGNOSIS — R188 Other ascites: Secondary | ICD-10-CM | POA: Diagnosis not present

## 2016-07-22 MED ORDER — ALBUMIN HUMAN 25 % IV SOLN
INTRAVENOUS | Status: AC
  Administered 2016-07-22: 25 g
  Filled 2016-07-22: qty 50

## 2016-07-22 NOTE — Sedation Documentation (Signed)
5L pulled off at this time. Pt is alert and oriented with no distress noted. VS stable.

## 2016-07-22 NOTE — Sedation Documentation (Signed)
1 L drained off at this time.

## 2016-07-22 NOTE — Sedation Documentation (Signed)
7L pulled off at this time.

## 2016-07-22 NOTE — Sedation Documentation (Signed)
Paracentesis started.

## 2016-07-22 NOTE — Sedation Documentation (Signed)
12,650 mL pulled off of patient. VS stable. Pt is alert and oriented with no difficulties noted.   Drainage is dark tea colored.

## 2016-07-25 NOTE — Progress Notes (Signed)
Patient has had another large volume paracentesis. Needs office visit.

## 2016-07-30 ENCOUNTER — Other Ambulatory Visit: Payer: Self-pay | Admitting: Family Medicine

## 2016-08-03 ENCOUNTER — Telehealth: Payer: Self-pay | Admitting: Cardiology

## 2016-08-03 ENCOUNTER — Encounter: Payer: Medicaid Other | Admitting: *Deleted

## 2016-08-03 NOTE — Telephone Encounter (Signed)
Spoke with pt and reminded pt of remote transmission that is due today. Pt verbalized understanding.   

## 2016-08-06 ENCOUNTER — Other Ambulatory Visit: Payer: Self-pay | Admitting: Family Medicine

## 2016-08-06 NOTE — Progress Notes (Signed)
Yes, office visit in Nov is ok.

## 2016-08-07 ENCOUNTER — Encounter: Payer: Self-pay | Admitting: Cardiology

## 2016-08-11 NOTE — Progress Notes (Signed)
Letter reminder mailed to pt to keep the appt on 09/08/2016.

## 2016-08-14 ENCOUNTER — Telehealth: Payer: Self-pay | Admitting: *Deleted

## 2016-08-14 NOTE — Telephone Encounter (Signed)
Spoke with patient's mother, who tried to bring home phone to patient, but it was out of range and the call was dropped.  Called back and she asked that I call his cell number.  Called patient, LMOVM requesting call back.  Gave [facility] phone number for return call.  Will attempt to assist patient with setting up Latitude home monitor when he returns call.

## 2016-09-04 NOTE — Telephone Encounter (Signed)
LMOVM requesting call back.  Gave [facility] phone number for return call. 

## 2016-09-08 ENCOUNTER — Encounter: Payer: Self-pay | Admitting: Gastroenterology

## 2016-09-08 ENCOUNTER — Ambulatory Visit: Payer: Medicaid Other | Admitting: Gastroenterology

## 2016-09-08 ENCOUNTER — Telehealth: Payer: Self-pay | Admitting: Gastroenterology

## 2016-09-08 NOTE — Telephone Encounter (Signed)
PT WAS A NO SHOW AND LETTER SENT  °

## 2016-09-23 ENCOUNTER — Encounter: Payer: Self-pay | Admitting: *Deleted

## 2016-09-23 NOTE — Telephone Encounter (Signed)
Attempted to reach patient on cell number.  No VM set up.  Will send letter requesting that patient contact the [facility] for assistance with setting up his Latitude home monitor.

## 2016-09-23 NOTE — Progress Notes (Signed)
Opened in error

## 2016-09-24 ENCOUNTER — Inpatient Hospital Stay (HOSPITAL_COMMUNITY)
Admission: EM | Admit: 2016-09-24 | Discharge: 2016-09-29 | DRG: 433 | Discharge status: Against Medical Advice | Payer: Medicaid Other | Attending: Family Medicine | Admitting: Family Medicine

## 2016-09-24 ENCOUNTER — Emergency Department (HOSPITAL_COMMUNITY): Payer: Medicaid Other

## 2016-09-24 ENCOUNTER — Encounter (HOSPITAL_COMMUNITY): Payer: Self-pay | Admitting: Cardiology

## 2016-09-24 DIAGNOSIS — Z7982 Long term (current) use of aspirin: Secondary | ICD-10-CM | POA: Diagnosis not present

## 2016-09-24 DIAGNOSIS — Z95810 Presence of automatic (implantable) cardiac defibrillator: Secondary | ICD-10-CM | POA: Diagnosis present

## 2016-09-24 DIAGNOSIS — E785 Hyperlipidemia, unspecified: Secondary | ICD-10-CM | POA: Diagnosis present

## 2016-09-24 DIAGNOSIS — I10 Essential (primary) hypertension: Secondary | ICD-10-CM | POA: Diagnosis present

## 2016-09-24 DIAGNOSIS — D649 Anemia, unspecified: Secondary | ICD-10-CM | POA: Diagnosis present

## 2016-09-24 DIAGNOSIS — Z8249 Family history of ischemic heart disease and other diseases of the circulatory system: Secondary | ICD-10-CM | POA: Diagnosis not present

## 2016-09-24 DIAGNOSIS — B9620 Unspecified Escherichia coli [E. coli] as the cause of diseases classified elsewhere: Secondary | ICD-10-CM | POA: Diagnosis present

## 2016-09-24 DIAGNOSIS — Z01810 Encounter for preprocedural cardiovascular examination: Secondary | ICD-10-CM

## 2016-09-24 DIAGNOSIS — M87051 Idiopathic aseptic necrosis of right femur: Secondary | ICD-10-CM | POA: Diagnosis not present

## 2016-09-24 DIAGNOSIS — M87851 Other osteonecrosis, right femur: Secondary | ICD-10-CM | POA: Diagnosis present

## 2016-09-24 DIAGNOSIS — K219 Gastro-esophageal reflux disease without esophagitis: Secondary | ICD-10-CM | POA: Diagnosis present

## 2016-09-24 DIAGNOSIS — F1010 Alcohol abuse, uncomplicated: Secondary | ICD-10-CM | POA: Diagnosis present

## 2016-09-24 DIAGNOSIS — N179 Acute kidney failure, unspecified: Secondary | ICD-10-CM | POA: Diagnosis present

## 2016-09-24 DIAGNOSIS — N390 Urinary tract infection, site not specified: Secondary | ICD-10-CM | POA: Diagnosis present

## 2016-09-24 DIAGNOSIS — K429 Umbilical hernia without obstruction or gangrene: Secondary | ICD-10-CM | POA: Diagnosis present

## 2016-09-24 DIAGNOSIS — R748 Abnormal levels of other serum enzymes: Secondary | ICD-10-CM | POA: Diagnosis present

## 2016-09-24 DIAGNOSIS — I959 Hypotension, unspecified: Secondary | ICD-10-CM | POA: Diagnosis present

## 2016-09-24 DIAGNOSIS — K439 Ventral hernia without obstruction or gangrene: Secondary | ICD-10-CM | POA: Diagnosis not present

## 2016-09-24 DIAGNOSIS — F329 Major depressive disorder, single episode, unspecified: Secondary | ICD-10-CM | POA: Diagnosis present

## 2016-09-24 DIAGNOSIS — R778 Other specified abnormalities of plasma proteins: Secondary | ICD-10-CM | POA: Diagnosis present

## 2016-09-24 DIAGNOSIS — F17210 Nicotine dependence, cigarettes, uncomplicated: Secondary | ICD-10-CM | POA: Diagnosis present

## 2016-09-24 DIAGNOSIS — Z9119 Patient's noncompliance with other medical treatment and regimen: Secondary | ICD-10-CM

## 2016-09-24 DIAGNOSIS — K7031 Alcoholic cirrhosis of liver with ascites: Secondary | ICD-10-CM | POA: Diagnosis present

## 2016-09-24 DIAGNOSIS — I349 Nonrheumatic mitral valve disorder, unspecified: Secondary | ICD-10-CM | POA: Diagnosis not present

## 2016-09-24 DIAGNOSIS — Z79899 Other long term (current) drug therapy: Secondary | ICD-10-CM | POA: Diagnosis not present

## 2016-09-24 DIAGNOSIS — E871 Hypo-osmolality and hyponatremia: Secondary | ICD-10-CM | POA: Diagnosis present

## 2016-09-24 DIAGNOSIS — K420 Umbilical hernia with obstruction, without gangrene: Secondary | ICD-10-CM | POA: Diagnosis not present

## 2016-09-24 DIAGNOSIS — Z888 Allergy status to other drugs, medicaments and biological substances status: Secondary | ICD-10-CM

## 2016-09-24 DIAGNOSIS — D638 Anemia in other chronic diseases classified elsewhere: Secondary | ICD-10-CM | POA: Diagnosis present

## 2016-09-24 DIAGNOSIS — N321 Vesicointestinal fistula: Secondary | ICD-10-CM | POA: Diagnosis present

## 2016-09-24 DIAGNOSIS — R7989 Other specified abnormal findings of blood chemistry: Secondary | ICD-10-CM | POA: Diagnosis present

## 2016-09-24 DIAGNOSIS — M109 Gout, unspecified: Secondary | ICD-10-CM | POA: Diagnosis present

## 2016-09-24 DIAGNOSIS — K7460 Unspecified cirrhosis of liver: Secondary | ICD-10-CM

## 2016-09-24 DIAGNOSIS — L988 Other specified disorders of the skin and subcutaneous tissue: Secondary | ICD-10-CM

## 2016-09-24 LAB — MAGNESIUM: Magnesium: 2.2 mg/dL (ref 1.7–2.4)

## 2016-09-24 LAB — CBC WITH DIFFERENTIAL/PLATELET
Basophils Absolute: 0 10*3/uL (ref 0.0–0.1)
Basophils Relative: 0 %
Eosinophils Absolute: 0.1 10*3/uL (ref 0.0–0.7)
Eosinophils Relative: 1 %
HCT: 33.2 % — ABNORMAL LOW (ref 39.0–52.0)
Hemoglobin: 11.6 g/dL — ABNORMAL LOW (ref 13.0–17.0)
Lymphocytes Relative: 6 %
Lymphs Abs: 0.8 10*3/uL (ref 0.7–4.0)
MCH: 33.1 pg (ref 26.0–34.0)
MCHC: 34.9 g/dL (ref 30.0–36.0)
MCV: 94.9 fL (ref 78.0–100.0)
Monocytes Absolute: 1 10*3/uL (ref 0.1–1.0)
Monocytes Relative: 7 %
Neutro Abs: 12.8 10*3/uL — ABNORMAL HIGH (ref 1.7–7.7)
Neutrophils Relative %: 87 %
Platelets: 118 10*3/uL — ABNORMAL LOW (ref 150–400)
RBC: 3.5 MIL/uL — ABNORMAL LOW (ref 4.22–5.81)
RDW: 14.7 % (ref 11.5–15.5)
WBC: 14.7 10*3/uL — ABNORMAL HIGH (ref 4.0–10.5)

## 2016-09-24 LAB — URINALYSIS, ROUTINE W REFLEX MICROSCOPIC
GLUCOSE, UA: 100 mg/dL — AB
Nitrite: NEGATIVE
PH: 8 (ref 5.0–8.0)
Protein, ur: 100 mg/dL — AB
SPECIFIC GRAVITY, URINE: 1.015 (ref 1.005–1.030)

## 2016-09-24 LAB — COMPREHENSIVE METABOLIC PANEL
ALBUMIN: 2.2 g/dL — AB (ref 3.5–5.0)
ALT: 24 U/L (ref 17–63)
AST: 54 U/L — AB (ref 15–41)
Alkaline Phosphatase: 303 U/L — ABNORMAL HIGH (ref 38–126)
Anion gap: 13 (ref 5–15)
BILIRUBIN TOTAL: 0.8 mg/dL (ref 0.3–1.2)
BUN: 72 mg/dL — AB (ref 6–20)
CHLORIDE: 91 mmol/L — AB (ref 101–111)
CO2: 22 mmol/L (ref 22–32)
CREATININE: 3.42 mg/dL — AB (ref 0.61–1.24)
Calcium: 8.9 mg/dL (ref 8.9–10.3)
GFR calc Af Amer: 22 mL/min — ABNORMAL LOW (ref >60)
GFR, EST NON AFRICAN AMERICAN: 19 mL/min — AB (ref >60)
GLUCOSE: 84 mg/dL (ref 65–99)
POTASSIUM: 5.1 mmol/L (ref 3.5–5.1)
Sodium: 126 mmol/L — ABNORMAL LOW (ref 135–145)
Total Protein: 7.2 g/dL (ref 6.5–8.1)

## 2016-09-24 LAB — URINE MICROSCOPIC-ADD ON

## 2016-09-24 LAB — TROPONIN I

## 2016-09-24 LAB — I-STAT TROPONIN, ED: Troponin i, poc: 0.11 ng/mL (ref 0.00–0.08)

## 2016-09-24 LAB — ETHANOL: ALCOHOL ETHYL (B): 6 mg/dL — AB (ref <5)

## 2016-09-24 LAB — I-STAT CG4 LACTIC ACID, ED
Lactic Acid, Venous: 1.71 mmol/L (ref 0.5–1.9)
Lactic Acid, Venous: 1.65 mmol/L (ref 0.5–1.9)

## 2016-09-24 LAB — LIPASE, BLOOD: Lipase: 44 U/L (ref 11–51)

## 2016-09-24 LAB — GLUCOSE, CAPILLARY
GLUCOSE-CAPILLARY: 117 mg/dL — AB (ref 65–99)
Glucose-Capillary: 58 mg/dL — ABNORMAL LOW (ref 65–99)
Glucose-Capillary: 62 mg/dL — ABNORMAL LOW (ref 65–99)

## 2016-09-24 LAB — PROTIME-INR
INR: 1.22
Prothrombin Time: 15.5 seconds — ABNORMAL HIGH (ref 11.4–15.2)

## 2016-09-24 LAB — BRAIN NATRIURETIC PEPTIDE: B Natriuretic Peptide: 39 pg/mL (ref 0.0–100.0)

## 2016-09-24 MED ORDER — SODIUM CHLORIDE 0.9 % IV BOLUS (SEPSIS)
500.0000 mL | Freq: Once | INTRAVENOUS | Status: AC
Start: 2016-09-24 — End: 2016-09-24
  Administered 2016-09-24: 500 mL via INTRAVENOUS

## 2016-09-24 MED ORDER — FENOFIBRATE 160 MG PO TABS
160.0000 mg | ORAL_TABLET | Freq: Every day | ORAL | Status: DC
  Administered 2016-09-25 – 2016-09-29 (×5): 160 mg via ORAL
  Filled 2016-09-24 (×5): qty 1

## 2016-09-24 MED ORDER — SODIUM CHLORIDE 0.9 % IV BOLUS (SEPSIS)
1000.0000 mL | Freq: Once | INTRAVENOUS | Status: AC
  Administered 2016-09-24: 1000 mL via INTRAVENOUS

## 2016-09-24 MED ORDER — SODIUM CHLORIDE 0.9 % IV SOLN
INTRAVENOUS | Status: DC
  Administered 2016-09-24: 12:00:00 via INTRAVENOUS

## 2016-09-24 MED ORDER — PIPERACILLIN-TAZOBACTAM 3.375 G IVPB
3.3750 g | Freq: Three times a day (TID) | INTRAVENOUS | Status: DC
  Administered 2016-09-24 – 2016-09-27 (×9): 3.375 g via INTRAVENOUS
  Filled 2016-09-24 (×11): qty 50

## 2016-09-24 MED ORDER — VANCOMYCIN HCL 10 G IV SOLR: 1500.0000 mg | INTRAVENOUS | Status: DC

## 2016-09-24 MED ORDER — INFLUENZA VAC SPLIT QUAD 0.5 ML IM SUSY
0.5000 mL | PREFILLED_SYRINGE | INTRAMUSCULAR | Status: AC
  Administered 2016-09-25: 0.5 mL via INTRAMUSCULAR
  Filled 2016-09-24: qty 0.5

## 2016-09-24 MED ORDER — DEXTROSE 50 % IV SOLN
INTRAVENOUS | Status: AC
  Administered 2016-09-24: 25 mL
  Filled 2016-09-24: qty 50

## 2016-09-24 MED ORDER — VANCOMYCIN HCL 10 G IV SOLR
2000.0000 mg | Freq: Once | INTRAVENOUS | Status: AC
  Administered 2016-09-25: 2000 mg via INTRAVENOUS
  Filled 2016-09-24: qty 2000

## 2016-09-24 MED ORDER — FLUOXETINE HCL 10 MG PO CAPS
10.0000 mg | ORAL_CAPSULE | Freq: Every day | ORAL | Status: DC
  Administered 2016-09-25 – 2016-09-29 (×5): 10 mg via ORAL
  Filled 2016-09-24 (×5): qty 1

## 2016-09-24 MED ORDER — ASPIRIN 325 MG PO TABS
325.0000 mg | ORAL_TABLET | Freq: Every day | ORAL | Status: DC
  Administered 2016-09-25 – 2016-09-29 (×5): 325 mg via ORAL
  Filled 2016-09-24 (×5): qty 1

## 2016-09-24 MED ORDER — SODIUM CHLORIDE 0.9 % IV SOLN
INTRAVENOUS | Status: AC
  Administered 2016-09-24: 1000 mL via INTRAVENOUS
  Administered 2016-09-25: 08:00:00 via INTRAVENOUS

## 2016-09-24 MED ORDER — IOPAMIDOL (ISOVUE-300) INJECTION 61%
INTRAVENOUS | Status: AC
  Filled 2016-09-24: qty 30

## 2016-09-24 MED ORDER — ENOXAPARIN SODIUM 30 MG/0.3ML ~~LOC~~ SOLN
30.0000 mg | SUBCUTANEOUS | Status: DC
  Administered 2016-09-25: 30 mg via SUBCUTANEOUS
  Filled 2016-09-24: qty 0.3

## 2016-09-24 MED ORDER — PNEUMOCOCCAL VAC POLYVALENT 25 MCG/0.5ML IJ INJ
0.5000 mL | INJECTION | INTRAMUSCULAR | Status: AC
  Administered 2016-09-25: 0.5 mL via INTRAMUSCULAR
  Filled 2016-09-24: qty 0.5

## 2016-09-24 MED ORDER — INSULIN ASPART 100 UNIT/ML ~~LOC~~ SOLN: 0.0000 [IU] | Freq: Three times a day (TID) | SUBCUTANEOUS | Status: DC

## 2016-09-24 NOTE — Progress Notes (Signed)
New Admission Note:  Pt transferred to room 6E19 from [facility]  Arrival Method: [facility] Mental Orientation: Alert and Oriented x 4 Telemetry: Box 19 Assessment: Completed Skin: Intact IV: LAC with NS at 125 ml/hr Pain: denies Tubes: None Safety Measures: Safety Fall Prevention Plan has been discussed  Admission: To be completed [place] Orientation: Patient has been orientated to the room, unit and staff.  Family: None at bedside at this time  Orders to be reviewed and implemented. Will continue to monitor the patient. Call light has been placed within reach and bed alarm has been activated.   Burley SaverKami Gwynn Crossley, BSN, RN-BC Phone: [phone]

## 2016-09-24 NOTE — ED Notes (Signed)
[employer] called and stated that the the pt has three leads that were biventricular with no episodes.  Pt is in NSR.

## 2016-09-24 NOTE — ED Notes (Signed)
Report given to [facility], ETA 20 minutes 

## 2016-09-24 NOTE — Consult Note (Addendum)
Reason for Consult:umbilical hernia Referring Physician: Randen Kauth is an 55 y.o. male.  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 He has had a >1 mo history of malaise, decreased appetite, and vague pains. He notes hitting his abdomen against the refrigerator 2 weeks ago and then noting drainage from his umbilicus. The drainage has continued. He has abdominal pain over his left and right sides. He denies fevers. He denies vomiting. He is followed by a doctor in [place] for his liver disease.  Past Medical History:  Diagnosis Date  . AICD (automatic cardioverter/defibrillator) present   . Allergy   . 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 Depression   . Dysrhythmia   . Fistula    Bladder Fistula  . GERD (gastroesophageal reflux disease)   . Gout   . Heart failure ([place])   . Hepatitis    ? unsure   . Hyperlipidemia   . Hypertension   . Neuromuscular disorder ([place])   . Presence of permanent cardiac pacemaker   . Shortness of breath dyspnea     Past Surgical History:  Procedure Laterality Date  . COLONOSCOPY  Nov 2016   Dr. Berkley Harvey at [place]: two 6-8 mm polyps (tubular adenomas). Surveillance 2019.   Marland Kitchen EP IMPLANTABLE DEVICE  2015   defibrillator/pacemaker  . heart catherization  2015   no blockages  . [place]   right hand  . TUMOR REMOVAL  1995   right ear    Family History  Problem Relation Age of Onset  . Breast cancer Mother   . Depression Mother   . Diabetes Mother   . Hearing loss Mother   . Hyperlipidemia Mother   . Hypertension Mother   . Mental illness Mother   . Vision loss Mother   . Alcohol abuse Father   . Arthritis Father   . Early death Father   . Hyperlipidemia Father   . Hypertension Father   . Vision loss Father   . Colon cancer Paternal Grandmother   . Liver cancer Cousin   . Colon polyps Neg Hx     Social History:  reports that he has been smoking Cigarettes.  He has a  40.00 pack-year smoking history. He has never used smokeless tobacco. He reports that he drinks about 2.4 oz of alcohol per week . He reports that he does not use drugs.  Allergies:  Allergies  Allergen Reactions  . Coreg [Carvedilol] Swelling    Swelling of lips    Medications: I have reviewed the patient's current medications.  Results for orders placed or performed during the hospital encounter of 09/24/16 (from the past 48 hour(s))  Urinalysis, Routine w reflex microscopic     Status: Abnormal   Collection Time: 09/24/16 11:28 AM  Result Value Ref Range   Color, Urine BROWN (A) YELLOW    Comment: BIOCHEMICALS MAY BE AFFECTED BY COLOR   APPearance CLOUDY (A) CLEAR   Specific Gravity, Urine 1.015 1.005 - 1.030   pH 8.0 5.0 - 8.0   Glucose, UA 100 (A) NEGATIVE mg/dL   Hgb urine dipstick LARGE (A) NEGATIVE   Bilirubin Urine LARGE (A) NEGATIVE   Ketones, ur TRACE (A) NEGATIVE mg/dL   Protein, ur 100 (A) NEGATIVE mg/dL   Nitrite NEGATIVE NEGATIVE   Leukocytes, UA LARGE (A) NEGATIVE  Urine microscopic-add on     Status: Abnormal   Collection Time: 09/24/16 11:28 AM  Result Value Ref Range   Squamous Epithelial / LPF 0-5 (A) NONE SEEN   WBC, UA 6-30 0 - 5 WBC/hpf   RBC / HPF 6-30 0 - 5 RBC/hpf   Bacteria, UA MANY (A) NONE SEEN  Comprehensive metabolic panel     Status: Abnormal   Collection Time: 09/24/16 11:32 AM  Result Value Ref Range   Sodium 126 (L) 135 - 145 mmol/L   Potassium 5.1 3.5 - 5.1 mmol/L   Chloride 91 (L) 101 - 111 mmol/L   CO2 22 22 - 32 mmol/L   Glucose, Bld 84 65 - 99 mg/dL   BUN 72 (H) 6 - 20 mg/dL   Creatinine, Ser 3.42 (H) 0.61 - 1.24 mg/dL   Calcium 8.9 8.9 - 10.3 mg/dL   Total Protein 7.2 6.5 - 8.1 g/dL   Albumin 2.2 (L) 3.5 - 5.0 g/dL   AST 54 (H) 15 - 41 U/L   ALT 24 17 - 63 U/L   Alkaline Phosphatase 303 (H) 38 - 126 U/L   Total Bilirubin 0.8 0.3 - 1.2 mg/dL   GFR calc non Af Amer 19 (L) >60 mL/min   GFR calc Af Amer 22 (L) >60 mL/min     Comment: (NOTE) The eGFR has been calculated using the CKD EPI equation. This calculation has not been validated in all clinical situations. eGFR's persistently <60 mL/min signify possible Chronic Kidney Disease.    Anion gap 13 5 - 15  CBC with Differential     Status: Abnormal   Collection Time: 09/24/16 11:32 AM  Result Value Ref Range   WBC 14.7 (H) 4.0 - 10.5 K/uL   RBC 3.50 (L) 4.22 - 5.81 MIL/uL   Hemoglobin 11.6 (L) 13.0 - 17.0 g/dL   HCT 33.2 (L) 39.0 - 52.0 %   MCV 94.9 78.0 - 100.0 fL   MCH 33.1 26.0 - 34.0 pg   MCHC 34.9 30.0 - 36.0 g/dL   RDW 14.7 11.5 - 15.5 %   Platelets 118 (L) 150 - 400 K/uL    Comment: SPECIMEN CHECKED FOR CLOTS PLATELET COUNT CONFIRMED BY SMEAR    Neutrophils Relative % 87 %   Neutro Abs 12.8 (H) 1.7 - 7.7 K/uL   Lymphocytes Relative 6 %   Lymphs Abs 0.8 0.7 - 4.0 K/uL   Monocytes Relative 7 %   Monocytes Absolute 1.0 0.1 - 1.0 K/uL   Eosinophils Relative 1 %   Eosinophils Absolute 0.1 0.0 - 0.7 K/uL   Basophils Relative 0 %   Basophils Absolute 0.0 0.0 - 0.1 K/uL  Protime-INR     Status: Abnormal   Collection Time: 09/24/16 11:32 AM  Result Value Ref Range   Prothrombin Time 15.5 (H) 11.4 - 15.2 seconds   INR 1.22   Magnesium     Status: None   Collection Time: 09/24/16 11:38 AM  Result Value Ref Range   Magnesium 2.2 1.7 - 2.4 mg/dL  Ethanol     Status: Abnormal   Collection Time: 09/24/16 11:38 AM  Result Value Ref Range   Alcohol, Ethyl (B) 6 (H) <5 mg/dL    Comment:        LOWEST DETECTABLE LIMIT FOR SERUM ALCOHOL IS 5 mg/dL FOR MEDICAL PURPOSES ONLY   I-Stat CG4 Lactic Acid, ED     Status: None   Collection Time: 09/24/16 11:45 AM  Result Value Ref Range   Lactic Acid, Venous 1.71 0.5 - 1.9 mmol/L  Brain natriuretic peptide  Status: None   Collection Time: 09/24/16 12:06 PM  Result Value Ref Range   B Natriuretic Peptide 39.0 0.0 - 100.0 pg/mL  Lipase, blood     Status: None   Collection Time: 09/24/16 12:06 PM   Result Value Ref Range   Lipase 44 11 - 51 U/L  I-stat troponin, ED     Status: Abnormal   Collection Time: 09/24/16  1:57 PM  Result Value Ref Range   Troponin i, poc 0.11 (HH) 0.00 - 0.08 ng/mL   Comment NOTIFIED PHYSICIAN    Comment 3            Comment: Due to the release kinetics of cTnI, a negative result within the first hours of the onset of symptoms does not rule out myocardial infarction with certainty. If myocardial infarction is still suspected, repeat the test at appropriate intervals.   I-Stat CG4 Lactic Acid, ED     Status: None   Collection Time: 09/24/16  2:52 PM  Result Value Ref Range   Lactic Acid, Venous 1.65 0.5 - 1.9 mmol/L  Glucose, capillary     Status: Abnormal   Collection Time: 09/24/16  8:25 PM  Result Value Ref Range   Glucose-Capillary 58 (L) 65 - 99 mg/dL  Glucose, capillary     Status: Abnormal   Collection Time: 09/24/16  9:12 PM  Result Value Ref Range   Glucose-Capillary 62 (L) 65 - 99 mg/dL  Glucose, capillary     Status: Abnormal   Collection Time: 09/24/16  9:42 PM  Result Value Ref Range   Glucose-Capillary 117 (H) 65 - 99 mg/dL    Ct Abdomen Pelvis Wo Contrast  Result Date: 09/24/2016 CLINICAL DATA:  Central and abdominal pain, history of ventral hernia, abdominal distention, decreased appetite EXAM: CT ABDOMEN AND PELVIS WITHOUT CONTRAST TECHNIQUE: Multidetector CT imaging of the abdomen and pelvis was performed following the standard protocol without IV contrast. COMPARISON:  CT abdomen pelvis of 01/24/2016 FINDINGS: Lower chest: The lung bases are clear.  Mild cardiomegaly is stable. Hepatobiliary: The liver enhances with no focal abnormality. The contours the liver again are somewhat nodular suggesting cirrhosis. A small amount of perihepatic ascites is noted. The gallbladder appears decompressed. Pancreas: The pancreas is normal in size and the pancreatic duct is not dilated. Spleen: There is splenomegaly noted. There is a small  amount of perisplenic ascites within the left upper quadrant. Varices are present within the upper abdomen. Adrenals/Urinary Tract: The adrenal glands appear normal. No renal calculi are seen and no hydronephrosis is noted. A small cyst emanates from the or lower pole of the right kidney anteriorly and is unchanged. Ureters are normal in caliber. The urinary bladder is decompressed and difficult to assess. However the does appear to be a calcification within the urinary bladder which could represent a recently passed ureteral calculus of 3 mm in diameter. 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 The questioned calculus could represent some contrast that has entered through the fistulous communication into the urinary bladder rather than a calculus. Stomach/Bowel: The stomach is moderately distended with oral contrast food debris and air. No abnormality is seen. No small bowel distention is seen. There are rectosigmoid and descending colon diverticula present and there is fluid throughout the left colon in particular. Vascular/Lymphatic: The abdominal aorta is normal in caliber with moderate abdominal aortic atherosclerosis present. No adenopathy is seen. Reproductive: The  prostate is normal in size. Other: There is umbilical hernia present containing fat and ascites. No definite bowel is seen to enter this hernia sac. Also, fat enters the left inguinal canal. Musculoskeletal: There is sclerosis within the right femoral head without cortical disruption most consistent with avascular necrosis. The left femoral head is unremarkable. The lumbar vertebrae are in normal alignment with normal intervertebral disc spaces. IMPRESSION: 1. Umbilical hernia containing fat and ascites. 2. 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 3. Possible recently  passed 3 mm ureteral calculus within urinary bladder versus contrast that has entered the urinary bladder from the sigmoid colon to urinary bladder fistula. Some air is noted in urinary bladder. 4. Perihepatic and perisplenic ascites. 5. Moderate abdominal aortic atherosclerosis. 6. 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 7. Rectosigmoid and descending colon diverticula Electronically Signed   By: Ivar Drape M.D.   On: 09/24/2016 15:05   Dg Chest Port 1 View  Result Date: 09/24/2016 CLINICAL DATA:  Hypotension EXAM: PORTABLE CHEST 1 VIEW COMPARISON:  None. FINDINGS: Heart size within normal limits. Negative for heart failure. Lungs are clear without infiltrate or effusion. 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 IMPRESSION: No active disease. Electronically Signed   By: Franchot Gallo M.D.   On: 09/24/2016 12:13    Review of Systems  Constitutional: Positive for malaise/fatigue. Negative for chills, fever and weight loss.  HENT: Negative for hearing loss.   Eyes: Negative for blurred vision and double vision.  Respiratory: Negative for cough and hemoptysis.   Cardiovascular: Negative for chest pain and palpitations.  Gastrointestinal: Positive for abdominal pain and nausea. Negative for vomiting.  Genitourinary: Positive for flank pain. Negative for dysuria and urgency.  Musculoskeletal: Negative for myalgias and neck pain.  Skin: Negative for itching and rash.  Neurological: Positive for weakness. Negative for dizziness, tingling and headaches.  Endo/Heme/Allergies: Bruises/bleeds easily.  Psychiatric/Behavioral: Negative for depression and suicidal ideas.   Blood pressure (!) 103/46, pulse 85, temperature 98 F (36.7 C), temperature source Oral, resp. rate 18, height 5' 8"  (1.727 m), weight 112.8 kg (248 lb 10.9 oz), SpO2 100 %. Physical Exam  Vitals reviewed. Constitutional: He is oriented to person, place, and time. He appears well-developed and  well-nourished.  HENT:  Head: Normocephalic and atraumatic.  Eyes: Conjunctivae and EOM are normal. Pupils are equal, round, and reactive to light.  Neck: Normal range of motion. Neck supple.  Cardiovascular: Normal rate and regular rhythm.   Respiratory: Effort normal and breath sounds normal.  GI: Soft. Bowel sounds are normal. He exhibits distension. There is tenderness. A hernia is present. Hernia confirmed positive in the ventral area.  Darkened skin in the umbilical area, central skin defect of 19m with foul smelling yellow drainage  Musculoskeletal: Normal range of motion.  Neurological: He is alert and oriented to person, place, and time.  Skin: Skin is warm and dry.  Psychiatric: He has a normal mood and affect. His behavior is normal.    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 He has ascites draining from a thin skin of his umbilicus related to his cirrhosis. This has a high risk of leading to peritonitis if not treated. Due to his multiple issues as well as evidence of acutely decompensating liver disease. -I recommend transfer to a liver  center so a multi-specialty liver team can deal with his post operative issues as he is at a high risk for morbidity as well as >10% mortality from this procedure. -broad spectrum antibiotics  Sigmoid-vesical fistula - I do not think this is the main issue of his pain and again, he is not a candidate for such an extensive surgery as would be required. I recommend treating his UTIs as needed.  Arta Bruce Jaelie Aguilera 09/24/2016, 10:50 PM

## 2016-09-24 NOTE — Progress Notes (Signed)
Pharmacy Antibiotic Note  Russell Trujillo is a 55 y.o. male admitted on 09/24/2016 with wound infection.  Pharmacy has been consulted for Vancomycin and Zosyn dosing.  Pt with pre-existing ventral hernia and vesicorectal fistula who has been experiencing periumbilial pain and drainage of fluid from hernia x 2 wks, to start antibiotics.  Cr is elevated; up from 1.18 in August.  nCrCl ~6720ml/min.  Plan: Zosyn 3.375g IV q8-4h infusion, will need adjusted if Cr cont to increase Vancomycin 2000mg  IV x 1, then 1500mg  IV q48 Watch renal fxn, f/u any cx data Vancomycin trough when appropriate  Height: 5\' 8"  (172.7 cm) Weight: 248 lb 10.9 oz (112.8 kg) IBW/kg (Calculated) : 68.4  Temp (24hrs), Avg:97.7 F (36.5 C), Min:97.3 F (36.3 C), Max:98 F (36.7 C)   Recent Labs Lab 09/24/16 1132 09/24/16 1145 09/24/16 1452  WBC 14.7*  --   --   CREATININE 3.42*  --   --   LATICACIDVEN  --  1.71 1.65    Estimated Creatinine Clearance: 29.8 mL/min (by C-G formula based on SCr of 3.42 mg/dL (H)).    Allergies  Allergen Reactions  . Coreg [Carvedilol] Swelling    Swelling of lips    Antimicrobials this admission: 11/30 Vanc >>  11/30 Zosyn >>   Dose adjustments this admission:   Microbiology results: 11/30 BCx:  11/30 UCx:   11/30 MRSA PCR:   Thank you for allowing pharmacy to be a part of this patient's care.   Marisue HumbleKendra Richele Strand, PharmD Clinical Pharmacist [facility]

## 2016-09-24 NOTE — H&P (Signed)
History and Physical    Russell Trujillo VQQ:[id number] DOB: 1961/05/21 DOA: 09/24/2016  PCP: Elige RadonJoshua A Dettinger, MD   Patient coming from: Home.  Chief Complaint: Abdominal pain and drainage from hernia 2 weeks.  HPI: Russell Trujillo is a 55 y.o. male with medical history significant of AICD implant, allergy, subjective necrosis of the right hip, depression, dysrhythmia, vesicorectal fistula, ventral hernia, GERD, gout, heart failure, hepatitis, hyperlipidemia, hypertension, morbid obesity who is coming to the emergency department due to abdominal pain and drainage from his umbilical hernia for the past 2 weeks after accidentally hitting his abdomen at home with his refrigerator. He also complains of decreased appetite, decreased urine volume, occasional loose stools for the past few days, also nausea and emesis (has thrown up 3 times today) denies fever, chills, but complains of fatigue. He denies chest pain, palpitations, diaphoresis, but complains of mild dyspnea and postural dizziness.  Per patient, he has been having this problem for a while. He was last seen by Dr. Derrell Trujillo at [facility] for this and was supposed to follow up at [facility] for this issue, but he has not followed yet. He cannot remember the name of his cardiologist or provide any more details of his medical history.   ED Course: The patient received IV fluid bolus of 1500 mL. EKG was paced rhythm. His workup showed WBC of 14.7, hemoglobin level of 11.6 g/dL. His BUN 72, creatinine 3.42 mg/dL. AST 54 and alkaline phosphatase 303 units. His troponin level was 0.11 ng/mL.  Imaging: CT scan of the abdomen and pelvis with contrast showed umbilical hernia containing fat and ascites. Also positive for vesicorectal fistula and a vascular necrosis of the right hip among other findings. Please see full report for further details.  Review of Systems: As per HPI otherwise 10 point review of systems negative.    Past Medical History:    Diagnosis Date  . AICD (automatic cardioverter/defibrillator) present   . Allergy   . 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 Depression   . Dysrhythmia   . Fistula    Bladder Fistula  . GERD (gastroesophageal reflux disease)   . Gout   . Heart failure (HCC)   . Hepatitis    ? unsure   . Hyperlipidemia   . Hypertension   . Neuromuscular disorder (HCC)   . Presence of permanent cardiac pacemaker   . Shortness of breath dyspnea     Past Surgical History:  Procedure Laterality Date  . COLONOSCOPY  Nov 2016   Dr. Rossie Trujillo at [facility]: two 6-8 mm polyps (tubular adenomas). Surveillance 2019.   Marland Kitchen. EP IMPLANTABLE DEVICE  2015   defibrillator/pacemaker  . heart catherization  2015   no blockages  . TENDON REPAIR  1986   right hand  . TUMOR REMOVAL  1995   right ear     reports that he has been smoking Cigarettes.  He has a 40.00 pack-year smoking history. He has never used smokeless tobacco. He reports that he drinks about 2.4 oz of alcohol per week . He reports that he does not use drugs.  Allergies  Allergen Reactions  . Coreg [Carvedilol] Swelling    Swelling of lips    Family History  Problem Relation Age of Onset  . Breast cancer Mother   . Depression Mother   . Diabetes Mother   . Hearing loss Mother   . Hyperlipidemia Mother   . Hypertension Mother   . Mental illness Mother   .  Vision loss Mother   . Alcohol abuse Father   . Arthritis Father   . Early death Father   . Hyperlipidemia Father   . Hypertension Father   . Vision loss Father   . Colon cancer Paternal Grandmother   . Liver cancer Cousin   . Colon polyps Neg Hx     Prior to Admission medications   Medication Sig Start Date End Date Taking? Authorizing Provider  aspirin 325 MG tablet Take 325 mg by mouth daily.   Yes Historical Provider, MD  fenofibrate (TRICOR) 145 MG tablet Take 1 tablet (145 mg total) by mouth daily. 02/20/16  Yes Elige RadonJoshua A Dettinger, MD  FLUoxetine (PROZAC) 10 MG  capsule Take 10 mg by mouth daily.   Yes Historical Provider, MD  ibuprofen (ADVIL,MOTRIN) 800 MG tablet Take 800 mg by mouth every 8 (eight) hours as needed for moderate pain.    Yes Historical Provider, MD  lisinopril (PRINIVIL,ZESTRIL) 20 MG tablet TAKE 1 TABLET DAILY 07/30/16  Yes Elige RadonJoshua A Dettinger, MD  metoprolol succinate (TOPROL-XL) 25 MG 24 hr tablet TAKE 1 TABLET DAILY 08/06/16  Yes Elige RadonJoshua A Dettinger, MD    Physical Exam:  Constitutional: Looks disheveled, chronically ill, there is dried vomit on his hospital gown, but NAD at this time. Vitals:   09/24/16 1500 09/24/16 1515 09/24/16 1530 09/24/16 1545  BP: (!) 90/42 (!) 88/36  (!) 88/41  Pulse: 84 80 83 85  Resp: 17 15 21 17   Temp:      TempSrc:      SpO2: 100% 100% 100% 100%   Eyes: PERRL, lids and conjunctivae normal ENMT: Mucous membranes are moist. Posterior pharynx clear of any exudate or lesions. Neck: normal, supple, no masses, no thyromegaly Respiratory: clear to auscultation bilaterally, no wheezing, no crackles. Normal respiratory effort. No accessory muscle use.  Cardiovascular: Regular rate and rhythm, no murmurs / rubs / gallops. No extremity edema. 2+ pedal pulses. No carotid bruits.  Abdomen: Distended, positive 8x8 umbilical hernia with a 0.5 -0.75 cm central ulceration which is oozing serous, foul smelling discharge. Positive mild diffuse tenderness, no guarding/rebound/masses palpated.Bowel sounds positive.  Musculoskeletal: no clubbing / cyanosis.  no contractures. Normal muscle tone.  Skin: Multiple areas of ecchymosis. Hyperpigmentation of lower extremities. Neurologic: CN 2-12 grossly intact. Sensation intact, DTR normal. Strength 5/5 in all 4.  Psychiatric:  Alert and oriented x 3. Depressed/slow mood.     Labs on Admission: I have personally reviewed following labs and imaging studies  CBC:  Recent Labs Lab 09/24/16 1132  WBC 14.7*  NEUTROABS 12.8*  HGB 11.6*  HCT 33.2*  MCV 94.9  PLT 118*     Basic Metabolic Panel:  Recent Labs Lab 09/24/16 1132 09/24/16 1138  NA 126*  --   K 5.1  --   CL 91*  --   CO2 22  --   GLUCOSE 84  --   BUN 72*  --   CREATININE 3.42*  --   CALCIUM 8.9  --   MG  --  2.2   GFR: CrCl cannot be calculated (Unknown ideal weight.). Liver Function Tests:  Recent Labs Lab 09/24/16 1132  AST 54*  ALT 24  ALKPHOS 303*  BILITOT 0.8  PROT 7.2  ALBUMIN 2.2*    Recent Labs Lab 09/24/16 1206  LIPASE 44   No results for input(s): AMMONIA in the last 168 hours. Coagulation Profile:  Recent Labs Lab 09/24/16 1132  INR 1.22   Cardiac Enzymes: No results  for input(s): CKTOTAL, CKMB, CKMBINDEX, TROPONINI in the last 168 hours. BNP (last 3 results) No results for input(s): PROBNP in the last 8760 hours. HbA1C: No results for input(s): HGBA1C in the last 72 hours. CBG: No results for input(s): GLUCAP in the last 168 hours. Lipid Profile: No results for input(s): CHOL, HDL, LDLCALC, TRIG, CHOLHDL, LDLDIRECT in the last 72 hours. Thyroid Function Tests: No results for input(s): TSH, T4TOTAL, FREET4, T3FREE, THYROIDAB in the last 72 hours. Anemia Panel: No results for input(s): VITAMINB12, FOLATE, FERRITIN, TIBC, IRON, RETICCTPCT in the last 72 hours. Urine analysis:    Component Value Date/Time   COLORURINE BROWN (A) 09/24/2016 1128   APPEARANCEUR CLOUDY (A) 09/24/2016 1128   LABSPEC 1.015 09/24/2016 1128   PHURINE 8.0 09/24/2016 1128   GLUCOSEU 100 (A) 09/24/2016 1128   HGBUR LARGE (A) 09/24/2016 1128   BILIRUBINUR LARGE (A) 09/24/2016 1128   KETONESUR TRACE (A) 09/24/2016 1128   PROTEINUR 100 (A) 09/24/2016 1128   NITRITE NEGATIVE 09/24/2016 1128   LEUKOCYTESUR LARGE (A) 09/24/2016 1128   Radiological Exams on Admission: Ct Abdomen Pelvis Wo Contrast  Result Date: 09/24/2016 CLINICAL DATA:  Central and abdominal pain, history of ventral hernia, abdominal distention, decreased appetite EXAM: CT ABDOMEN AND PELVIS WITHOUT  CONTRAST TECHNIQUE: Multidetector CT imaging of the abdomen and pelvis was performed following the standard protocol without IV contrast. COMPARISON:  CT abdomen pelvis of 01/24/2016 FINDINGS: Lower chest: The lung bases are clear.  Mild cardiomegaly is stable. Hepatobiliary: The liver enhances with no focal abnormality. The contours the liver again are somewhat nodular suggesting cirrhosis. A small amount of perihepatic ascites is noted. The gallbladder appears decompressed. Pancreas: The pancreas is normal in size and the pancreatic duct is not dilated. Spleen: There is splenomegaly noted. There is a small amount of perisplenic ascites within the left upper quadrant. Varices are present within the upper abdomen. Adrenals/Urinary Tract: The adrenal glands appear normal. No renal calculi are seen and no hydronephrosis is noted. A small cyst emanates from the or lower pole of the right kidney anteriorly and is unchanged. Ureters are normal in caliber. The urinary bladder is decompressed and difficult to assess. However the does appear to be a calcification within the urinary bladder which could represent a recently passed ureteral calculus of 3 mm in diameter. 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 The questioned calculus could represent some contrast that has entered through the fistulous communication into the urinary bladder rather than a calculus. Stomach/Bowel: The stomach is moderately distended with oral contrast food debris and air. No abnormality is seen. No small bowel distention is seen. There are rectosigmoid and descending colon diverticula present and there is fluid throughout the left colon in particular. Vascular/Lymphatic: The abdominal aorta is normal in caliber with moderate abdominal aortic atherosclerosis present. No adenopathy is seen. Reproductive: The prostate is normal in size. Other:  There is umbilical hernia present containing fat and ascites. No definite bowel is seen to enter this hernia sac. Also, fat enters the left inguinal canal. Musculoskeletal: There is sclerosis within the right femoral head without cortical disruption most consistent with avascular necrosis. The left femoral head is unremarkable. The lumbar vertebrae are in normal alignment with normal intervertebral disc spaces. IMPRESSION: 1. Umbilical hernia containing fat and ascites. 2. 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 3. Possible  recently passed 3 mm ureteral calculus within urinary bladder versus contrast that has entered the urinary bladder from the sigmoid colon to urinary bladder fistula. Some air is noted in urinary bladder. 4. Perihepatic and perisplenic ascites. 5. Moderate abdominal aortic atherosclerosis. 6. 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 7. Rectosigmoid and descending colon diverticula Electronically Signed   By: Dwyane Dee M.D.   On: 09/24/2016 15:05   Dg Chest Port 1 View  Result Date: 09/24/2016 CLINICAL DATA:  Hypotension EXAM: PORTABLE CHEST 1 VIEW COMPARISON:  None. FINDINGS: Heart size within normal limits. Negative for heart failure. Lungs are clear without infiltrate or effusion. 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 IMPRESSION: No active disease. Electronically Signed   By: Marlan Palau M.D.   On: 09/24/2016 12:13    EKG: Independently reviewed.  Vent. rate 78 BPM PR interval * ms QRS duration 149 ms QT/QTc 462/527 ms P-R-T axes 48 270 83 Atrial-sensed ventricular-paced rhythm No further analysis attempted due to paced rhythm Increased ST elevation  Assessment/Plan Principal Problem:   AKI (acute kidney injury) (HCC) Admit to [facility] for higher level of care and multidisciplinary evaluation. Continue IV hydration.  Hold lisinopril and ibuprofen. Follow-up BUN  and creatinine. Discussed with nephrology at [facility].  Active Problems:   Elevated troponin   AICD (automatic cardioverter/defibrillator) present He normally follows with Dr. Tresa Endo. Continue cardiac monitoring. Trend troponin levels. Will consult cardiology at [facility]. Check echocardiogram.    History of alcohol abuse. Per patient, he has not been drinking for the past 2 weeks. However his alcohol level is positive. I will start CiWA protocol preemptively, thiamine, magnesium, folic acid and MVI supplementation.    Hyponatremia Continue normal saline infusion. Check urine sodium and osmolality. Follow-up sodium level.    Ventral hernia.   Vesicorectal fistula. Will consult general surgery and/or urology for further evaluation. Pain management as blood pressure allows. Started broad-spectrum IV antibiotics earlier.    Essential hypertension, benign Hold antihypertensives for now due to hypotension. Monitor blood pressure closely.    Hyperlipidemia LDL goal <130 Continue fenofibrate.    Anemia Check anemia profile. Monitor hematocrit and hemoglobin.    Avascular necrosis of bone of right hip (HCC) Per patient, he has been having a lot more pain lately. Consider consulting orthopedic surgery at [facility].      DVT prophylaxis: Lovenox SQ. Code Status: Full code. Family Communication:  Disposition Plan: Admit to [facility] for further treatment and multidisciplinary consultations. Consults called: Nephrology (Dr. Arrie Aran), General surgery, cardiology,  Admission status: Inpatient/Telemetry.   Bobette Mo MD [facility] Pager [phone].  If 7PM-7AM, please contact night-coverage [email or web address] Password [facility]  09/24/2016, 5:31 PM

## 2016-09-24 NOTE — Consult Note (Signed)
CARDIOLOGY CONSULT NOTE  Patient ID: Russell BarkMarty Trujillo MRN: [id number] DOB/AGE: 1961-08-29 55 y.o.  Admit date: 09/24/2016 Primary Physician Nils PyleJoshua A Dettinger, MD Primary Cardiologist   Dr. Ladona Ridgelaylor Chief Complaint    Umbilical drainage.  Requesting  Dr. Robb Matarrtiz  HPI:  The patient is admitted with an umbilical hernia with purulent drainage. He has a sigmoid-vesical fistula.  He is admitted for IV antibiotics.  However, because of multiple medical problems including liver disease it has been suggested that he be transferred to another center for any surgery.  Cardiology has been consulted because of his elevated troponin and for preop evaluation.  He has an extensive cardiac history  He had LV dysfunction and had a BiV ICD 2 years ago.  However, he subsequently had normalization of his EF.  He sees Dr. Ladona Ridgelaylor.  He saw Dr. Ladona Ridgelaylor in July and had normal ICD function.    His initial POC troponin was elevated.  However, his follow up was normal.  EKG shows sinus rhythm with ventricular pacing 100% capture rate 78.   I was able to locate the echo in [facility] last year.  His EF was 55 - 60%.  [facility] he had no coronary artery disease but his EF was 15%.    He reports no acute cardiac symptoms.  He has had chronic dyspnea and sleeps in a chair because of his back.  However, he does not have new SOB, PND or orthopnea.  The patient denies any new symptoms such as chest discomfort, neck or arm discomfort.  There have been no reported palpitations, presyncope or syncope.  It sounds like he is limited by joint pain and back pain.  He walks with a cane.   Past Medical History:  Diagnosis Date  . AICD (automatic cardioverter/defibrillator) present   . Allergy   . 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 Depression   . Dysrhythmia   . Fistula    Bladder Fistula  . GERD (gastroesophageal reflux disease)   . Gout   . Heart failure (HCC)   . Hepatitis    ?  unsure   . Hyperlipidemia   . Hypertension   . Neuromuscular disorder (HCC)   . Presence of permanent cardiac pacemaker   . Shortness of breath dyspnea     Past Surgical History:  Procedure Laterality Date  . COLONOSCOPY  Nov 2016   Dr. Rossie MuskratAl-Sabbagh at [facility]: two 6-8 mm polyps (tubular adenomas). Surveillance 2019.   Marland Kitchen. EP IMPLANTABLE DEVICE  2015   defibrillator/pacemaker  . heart catherization  2015   no blockages  . TENDON REPAIR  1986   right hand  . TUMOR REMOVAL  1995   right ear    Allergies  Allergen Reactions  . Coreg [Carvedilol] Swelling    Swelling of lips   Prescriptions Prior to Admission  Medication Sig Dispense Refill Last Dose  . aspirin 325 MG tablet Take 325 mg by mouth daily.   09/23/2016 at Unknown time  . fenofibrate (TRICOR) 145 MG tablet Take 1 tablet (145 mg total) by mouth daily. 90 tablet 1 09/23/2016 at Unknown time  . FLUoxetine (PROZAC) 10 MG capsule Take 10 mg by mouth daily.   09/23/2016 at Unknown time  . ibuprofen (ADVIL,MOTRIN) 800 MG tablet Take 800 mg by mouth every 8 (eight) hours as needed for moderate pain.    09/24/2016 at Unknown time  . lisinopril (PRINIVIL,ZESTRIL) 20 MG tablet  TAKE 1 TABLET DAILY 30 tablet 3 09/24/2016 at Unknown time  . metoprolol succinate (TOPROL-XL) 25 MG 24 hr tablet TAKE 1 TABLET DAILY 30 tablet 3 09/24/2016 at 0600   Family History  Problem Relation Age of Onset  . Breast cancer Mother   . Depression Mother   . Diabetes Mother   . Hearing loss Mother   . Hyperlipidemia Mother   . Hypertension Mother   . Mental illness Mother   . Vision loss Mother   . Alcohol abuse Father   . Arthritis Father   . Early death Father   . Hyperlipidemia Father   . Hypertension Father   . Vision loss Father   . Colon cancer Paternal Grandmother   . Liver cancer Cousin   . Colon polyps Neg Hx     Social History   Social History  . Marital status: Legally Separated    Spouse name: N/A  . Number of children: N/A  .  Years of education: N/A   Occupational History  . Not on file.   Social History Main Topics  . Smoking status: Current Every Day Smoker    Packs/day: 1.00    Years: 40.00    Types: Cigarettes  . Smokeless tobacco: Never Used  . Alcohol use 2.4 oz/week    4 Shots of liquor per week  . Drug use: No  . Sexual activity: No   Other Topics Concern  . Not on file   Social History Narrative  . No narrative on file     ROS:    As stated in the HPI and negative for all other systems.  Physical Exam: Blood pressure (!) 103/46, pulse 85, temperature 98 F (36.7 C), temperature source Oral, resp. rate 18, height 5\' 8"  (1.727 m), weight 248 lb 10.9 oz (112.8 kg), SpO2 100 %.  GENERAL:  Well appearing NECK:  No jugular venous distention, waveform within normal limits, carotid upstroke brisk and symmetric, no bruits, no thyromegaly LUNGS:  Clear to auscultation bilaterally BACK:  No CVA tenderness CHEST:  Unremarkable, ICD in place  HEART:  PMI not displaced or sustained,S1 and S2 within normal limits, no S3, no S4, no clicks, no rubs, no murmurs ABD:  Flat, positive bowel sounds normal in frequency in pitch, no bruits, no rebound, no guarding, no midline pulsatile mass, no hepatomegaly, no splenomegaly, morbidly obese with bandaged umbilicus.  EXT:  2 plus pulses throughout, no edema, no cyanosis no clubbing SKIN:  No rashes no nodules NEURO:  Cranial nerves II through XII grossly intact, motor grossly intact throughout PSYCH:  Cognitively intact, oriented to person place and time   Labs: Lab Results  Component Value Date   BUN 72 (H) 09/24/2016   Lab Results  Component Value Date   CREATININE 3.42 (H) 09/24/2016   Lab Results  Component Value Date   NA 126 (L) 09/24/2016   K 5.1 09/24/2016   CL 91 (L) 09/24/2016   CO2 22 09/24/2016   No results found for: TROPONINI Lab Results  Component Value Date   WBC 14.7 (H) 09/24/2016   HGB 11.6 (L) 09/24/2016   HCT 33.2 (L)  09/24/2016   MCV 94.9 09/24/2016   PLT 118 (L) 09/24/2016   No results found for: CHOL, HDL, LDLCALC, LDLDIRECT, TRIG, CHOLHDL Lab Results  Component Value Date   ALT 24 09/24/2016   AST 54 (H) 09/24/2016   ALKPHOS 303 (H) 09/24/2016   BILITOT 0.8 09/24/2016    EKG: See above  ASSESSMENT AND PLAN:   PREOP:  The patient had a negative lab draw troponin.  He has paced EKG and normal coronaries in 2015 with normal EF last year.  No acute cardiac symptoms.  OK to check echo to reevaluate EF.  However, in the absence of any further signs or symptoms of active cardiac disease there would be no cardiovascular contraindications to surgery as needed.  No change in therapy would be suggested.   Please call with further questions.    SignedRollene Rotunda: Shantel Helwig 09/24/2016, 9:39 PM

## 2016-09-24 NOTE — ED Triage Notes (Signed)
Abdominal pain and drainage from umbilicus times 2 weeks.  B/p 95/63 with EMS.

## 2016-09-24 NOTE — ED Provider Notes (Signed)
[facility]EM[facility]Y DEPT Provider Note   CSN: [id number] Arrival date & time: 09/24/16  1058  By signing my name below, I, Placido SouLogan Joldersma, attest that this documentation has been prepared under the direction and in the presence of Vanetta MuldersScott Atlantis Delong, MD. Electronically Signed: Placido SouLogan Joldersma, ED Scribe. 09/24/16. 11:50 AM.   History   Chief Complaint Chief Complaint  Patient presents with  . Abscess    HPI HPI Comments: Lizbeth BarkMarty Edgerly is a 55 y.o. male with a h/o ventral hernia who presents to the Emergency Department by ambulance complaining of central abdominal pain and drainage x 2 weeks. He reports associated difficulty sleeping, abdominal distension, decreased appetite, dysuria, back pain and subjective fever. Pt has a pacemaker in place. He used to consume ETOH regularly but denies doing so anymore. He is not on anticoagulation. Pt denies vomiting, diarrhea or other associated symptoms at this time.     The history is provided by the patient and medical records. No language interpreter was used.  Abdominal Pain   This is a chronic problem. The current episode started more than 1 week ago. The problem occurs constantly. The problem has not changed since onset.The pain is located in the periumbilical region. The pain is moderate. Associated symptoms include dysuria. Pertinent negatives include diarrhea and hematuria. Past medical history comments: umbilical hernia.    Past Medical History:  Diagnosis Date  . AICD (automatic cardioverter/defibrillator) present   . Allergy   . 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 Depression   . Dysrhythmia   . Fistula    Bladder Fistula  . GERD (gastroesophageal reflux disease)   . Gout   . Heart failure (HCC)   . Hepatitis    ? unsure   . Hyperlipidemia   . Hypertension   . Neuromuscular disorder (HCC)   . Presence of permanent cardiac pacemaker   . Shortness of breath dyspnea     Patient Active Problem List   Diagnosis Date Noted    . Edema of left lower extremity 03/02/2016  . Hepatic cirrhosis (HCC) 01/28/2016  . 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 Essential hypertension, benign 12/27/2015  . Hyperlipidemia LDL goal <130 12/27/2015  . AICD (automatic cardioverter/defibrillator) present 12/27/2015  . Ventral hernia 12/27/2015    Past Surgical History:  Procedure Laterality Date  . COLONOSCOPY  Nov 2016   Dr. Rossie MuskratAl-Sabbagh at [facility]: two 6-8 mm polyps (tubular adenomas). Surveillance 2019.   Marland Kitchen. EP IMPLANTABLE DEVICE  2015   defibrillator/pacemaker  . heart catherization  2015   no blockages  . TENDON REPAIR  1986   right hand  . TUMOR REMOVAL  1995   right ear       Home Medications    Prior to Admission medications   Medication Sig Start Date End Date Taking? Authorizing Provider  aspirin 325 MG tablet Take 325 mg by mouth daily.   Yes Historical Provider, MD  fenofibrate (TRICOR) 145 MG tablet Take 1 tablet (145 mg total) by mouth daily. 02/20/16  Yes Elige RadonJoshua A Dettinger, MD  FLUoxetine (PROZAC) 10 MG capsule Take 10 mg by mouth daily.   Yes Historical Provider, MD  ibuprofen (ADVIL,MOTRIN) 800 MG tablet Take 800 mg by mouth every 8 (eight) hours as needed for moderate pain.    Yes Historical Provider, MD  lisinopril (PRINIVIL,ZESTRIL) 20 MG tablet TAKE 1 TABLET DAILY 07/30/16  Yes Elige RadonJoshua A Dettinger, MD  metoprolol succinate (TOPROL-XL) 25 MG 24 hr tablet TAKE 1 TABLET DAILY 08/06/16  Yes Nils Pyle, MD    Family History Family History  Problem Relation Age of Onset  . Breast cancer Mother   . Depression Mother   . Diabetes Mother   . Hearing loss Mother   . Hyperlipidemia Mother   . Hypertension Mother   . Mental illness Mother   . Vision loss Mother   . Alcohol abuse Father   . Arthritis Father   . Early death Father   . Hyperlipidemia Father   . Hypertension Father   . Vision loss Father   . Colon cancer Paternal Grandmother   . Liver cancer Cousin   . Colon polyps Neg Hx      Social History Social History  Substance Use Topics  . Smoking status: Current Every Day Smoker    Packs/day: 1.00    Years: 40.00    Types: Cigarettes  . Smokeless tobacco: Never Used  . Alcohol use 2.4 oz/week    4 Shots of liquor per week     Allergies   Coreg [carvedilol]   Review of Systems Review of Systems  Constitutional: Positive for appetite change. Negative for chills.  HENT: Positive for congestion. Negative for postnasal drip, rhinorrhea, sinus pressure and sore throat.   Eyes: Negative for visual disturbance.  Respiratory: Negative for cough and shortness of breath.   Cardiovascular: Negative for chest pain and leg swelling.  Gastrointestinal: Positive for abdominal pain. Negative for diarrhea.  Genitourinary: Positive for dysuria. Negative for difficulty urinating and hematuria.  Musculoskeletal: Positive for back pain and neck pain.  Skin: Negative for rash.  Neurological: Negative for syncope and light-headedness.  Hematological: Does not bruise/bleed easily.  Psychiatric/Behavioral: Negative for confusion.  All other systems reviewed and are negative.  Physical Exam Updated Vital Signs BP (!) 82/48   Pulse 84   Temp 97.3 F (36.3 C) (Oral)   Resp 17   SpO2 97%   Physical Exam  Constitutional: He is oriented to person, place, and time. He appears well-developed and well-nourished. No distress.  HENT:  Head: Normocephalic and atraumatic.  Mouth/Throat: Oropharynx is clear and moist and mucous membranes are normal.  Eyes: Conjunctivae and EOM are normal. Pupils are equal, round, and reactive to light. No scleral icterus.  Neck: Normal range of motion. Neck supple. No tracheal deviation present.  Cardiovascular: Normal rate, regular rhythm, normal heart sounds and intact distal pulses.  Exam reveals no gallop and no friction rub.   No murmur heard. Pacemaker noted in the left anterior chest  Pulmonary/Chest: Effort normal and breath sounds  normal. No respiratory distress. He has no wheezes. He has no rales.  Abdominal: Soft. Bowel sounds are normal. He exhibits distension. There is no tenderness.  8 cm x 8 cm umbilical hernia noted. 1 cm region resembling a fistula. Clear fluid drainage with exudates.   Musculoskeletal: Normal range of motion. He exhibits no edema.  No swelling noted in the bilateral ankles  Neurological: He is alert and oriented to person, place, and time. No cranial nerve deficit. He exhibits normal muscle tone. Coordination normal.  Moves fingers and toes normally   Skin: Skin is warm and dry.  Psychiatric: He has a normal mood and affect. His behavior is normal.  Nursing note and vitals reviewed.  ED Treatments / Results  Labs (all labs ordered are listed, but only abnormal results are displayed) Labs Reviewed  COMPREHENSIVE METABOLIC PANEL - Abnormal; Notable for the following:       Result Value  Sodium 126 (*)    Chloride 91 (*)    BUN 72 (*)    Creatinine, Ser 3.42 (*)    Albumin 2.2 (*)    AST 54 (*)    Alkaline Phosphatase 303 (*)    GFR calc non Af Amer 19 (*)    GFR calc Af Amer 22 (*)    All other components within normal limits  CBC WITH DIFFERENTIAL/PLATELET - Abnormal; Notable for the following:    WBC 14.7 (*)    RBC 3.50 (*)    Hemoglobin 11.6 (*)    HCT 33.2 (*)    Platelets 118 (*)    Neutro Abs 12.8 (*)    All other components within normal limits  PROTIME-INR - Abnormal; Notable for the following:    Prothrombin Time 15.5 (*)    All other components within normal limits  I-STAT TROPOININ, ED - Abnormal; Notable for the following:    Troponin i, poc 0.11 (*)    All other components within normal limits  CULTURE, BLOOD (ROUTINE X 2)  CULTURE, BLOOD (ROUTINE X 2)  URINE CULTURE  BRAIN NATRIURETIC PEPTIDE  LIPASE, BLOOD  URINALYSIS, ROUTINE W REFLEX MICROSCOPIC (NOT AT [facility])  I-STAT CG4 LACTIC ACID, ED  I-STAT CG4 LACTIC ACID, ED   Results for orders placed or  performed during the hospital encounter of 09/24/16  Comprehensive metabolic panel  Result Value Ref Range   Sodium 126 (L) 135 - 145 mmol/L   Potassium 5.1 3.5 - 5.1 mmol/L   Chloride 91 (L) 101 - 111 mmol/L   CO2 22 22 - 32 mmol/L   Glucose, Bld 84 65 - 99 mg/dL   BUN 72 (H) 6 - 20 mg/dL   Creatinine, Ser 9.60 (H) 0.61 - 1.24 mg/dL   Calcium 8.9 8.9 - 45.4 mg/dL   Total Protein 7.2 6.5 - 8.1 g/dL   Albumin 2.2 (L) 3.5 - 5.0 g/dL   AST 54 (H) 15 - 41 U/L   ALT 24 17 - 63 U/L   Alkaline Phosphatase 303 (H) 38 - 126 U/L   Total Bilirubin 0.8 0.3 - 1.2 mg/dL   GFR calc non Af Amer 19 (L) >60 mL/min   GFR calc Af Amer 22 (L) >60 mL/min   Anion gap 13 5 - 15  CBC with Differential  Result Value Ref Range   WBC 14.7 (H) 4.0 - 10.5 K/uL   RBC 3.50 (L) 4.22 - 5.81 MIL/uL   Hemoglobin 11.6 (L) 13.0 - 17.0 g/dL   HCT 09.8 (L) 11.9 - 14.7 %   MCV 94.9 78.0 - 100.0 fL   MCH 33.1 26.0 - 34.0 pg   MCHC 34.9 30.0 - 36.0 g/dL   RDW 82.9 56.2 - 13.0 %   Platelets 118 (L) 150 - 400 K/uL   Neutrophils Relative % 87 %   Neutro Abs 12.8 (H) 1.7 - 7.7 K/uL   Lymphocytes Relative 6 %   Lymphs Abs 0.8 0.7 - 4.0 K/uL   Monocytes Relative 7 %   Monocytes Absolute 1.0 0.1 - 1.0 K/uL   Eosinophils Relative 1 %   Eosinophils Absolute 0.1 0.0 - 0.7 K/uL   Basophils Relative 0 %   Basophils Absolute 0.0 0.0 - 0.1 K/uL  Protime-INR  Result Value Ref Range   Prothrombin Time 15.5 (H) 11.4 - 15.2 seconds   INR 1.22   Urinalysis, Routine w reflex microscopic  Result Value Ref Range   Color, Urine BROWN (A) YELLOW  APPearance CLOUDY (A) CLEAR   Specific Gravity, Urine 1.015 1.005 - 1.030   pH 8.0 5.0 - 8.0   Glucose, UA 100 (A) NEGATIVE mg/dL   Hgb urine dipstick LARGE (A) NEGATIVE   Bilirubin Urine LARGE (A) NEGATIVE   Ketones, ur TRACE (A) NEGATIVE mg/dL   Protein, ur 161100 (A) NEGATIVE mg/dL   Nitrite NEGATIVE NEGATIVE   Leukocytes, UA LARGE (A) NEGATIVE  Brain natriuretic peptide  Result  Value Ref Range   B Natriuretic Peptide 39.0 0.0 - 100.0 pg/mL  Lipase, blood  Result Value Ref Range   Lipase 44 11 - 51 U/L  Urine microscopic-add on  Result Value Ref Range   Squamous Epithelial / LPF 0-5 (A) NONE SEEN   WBC, UA 6-30 0 - 5 WBC/hpf   RBC / HPF 6-30 0 - 5 RBC/hpf   Bacteria, UA MANY (A) NONE SEEN  I-Stat CG4 Lactic Acid, ED  Result Value Ref Range   Lactic Acid, Venous 1.71 0.5 - 1.9 mmol/L  I-stat troponin, ED  Result Value Ref Range   Troponin i, poc 0.11 (HH) 0.00 - 0.08 ng/mL   Comment NOTIFIED PHYSICIAN    Comment 3          I-Stat CG4 Lactic Acid, ED  Result Value Ref Range   Lactic Acid, Venous 1.65 0.5 - 1.9 mmol/L     EKG  EKG Interpretation  Date/Time:  Thursday September 24 2016 11:32:49 EST Ventricular Rate:  78 PR Interval:    QRS Duration: 149 QT Interval:  462 QTC Calculation: 527 R Axis:   -90 Text Interpretation:  Atrial-sensed ventricular-paced rhythm No further analysis attempted due to paced rhythm Increased ST elevation  Confirmed by Linzie Criss  MD, Jamillah Camilo [phone]([id number]) on 09/24/2016 2:23:24 PM       Radiology Ct Abdomen Pelvis Wo Contrast  Result Date: 09/24/2016 CLINICAL DATA:  Central and abdominal pain, history of ventral hernia, abdominal distention, decreased appetite EXAM: CT ABDOMEN AND PELVIS WITHOUT CONTRAST TECHNIQUE: Multidetector CT imaging of the abdomen and pelvis was performed following the standard protocol without IV contrast. COMPARISON:  CT abdomen pelvis of 01/24/2016 FINDINGS: Lower chest: The lung bases are clear.  Mild cardiomegaly is stable. Hepatobiliary: The liver enhances with no focal abnormality. The contours the liver again are somewhat nodular suggesting cirrhosis. A small amount of perihepatic ascites is noted. The gallbladder appears decompressed. Pancreas: The pancreas is normal in size and the pancreatic duct is not dilated. Spleen: There is splenomegaly noted. There is a small amount of perisplenic ascites  within the left upper quadrant. Varices are present within the upper abdomen. Adrenals/Urinary Tract: The adrenal glands appear normal. No renal calculi are seen and no hydronephrosis is noted. A small cyst emanates from the or lower pole of the right kidney anteriorly and is unchanged. Ureters are normal in caliber. The urinary bladder is decompressed and difficult to assess. However the does appear to be a calcification within the urinary bladder which could represent a recently passed ureteral calculus of 3 mm in diameter. 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 The questioned calculus could represent some contrast that has entered through the fistulous communication into the urinary bladder rather than a calculus. Stomach/Bowel: The stomach is moderately distended with oral contrast food debris and air. No abnormality is seen. No small bowel distention is seen. There are rectosigmoid and descending colon diverticula present and  there is fluid throughout the left colon in particular. Vascular/Lymphatic: The abdominal aorta is normal in caliber with moderate abdominal aortic atherosclerosis present. No adenopathy is seen. Reproductive: The prostate is normal in size. Other: There is umbilical hernia present containing fat and ascites. No definite bowel is seen to enter this hernia sac. Also, fat enters the left inguinal canal. Musculoskeletal: There is sclerosis within the right femoral head without cortical disruption most consistent with avascular necrosis. The left femoral head is unremarkable. The lumbar vertebrae are in normal alignment with normal intervertebral disc spaces. IMPRESSION: 1. Umbilical hernia containing fat and ascites. 2. 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 3. Possible recently passed 3 mm ureteral calculus  within urinary bladder versus contrast that has entered the urinary bladder from the sigmoid colon to urinary bladder fistula. Some air is noted in urinary bladder. 4. Perihepatic and perisplenic ascites. 5. Moderate abdominal aortic atherosclerosis. 6. 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 7. Rectosigmoid and descending colon diverticula Electronically Signed   By: Dwyane Dee M.D.   On: 09/24/2016 15:05   Dg Chest Port 1 View  Result Date: 09/24/2016 CLINICAL DATA:  Hypotension EXAM: PORTABLE CHEST 1 VIEW COMPARISON:  None. FINDINGS: Heart size within normal limits. Negative for heart failure. Lungs are clear without infiltrate or effusion. 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 IMPRESSION: No active disease. Electronically Signed   By: Marlan Palau M.D.   On: 09/24/2016 12:13    Procedures Procedures  DIAGNOSTIC STUDIES: Oxygen Saturation is 100% on RA, normal by my interpretation.    COORDINATION OF CARE: 11:47 AM Discussed next steps with pt. Pt verbalized understanding and is agreeable with the plan.    Medications Ordered in ED Medications  0.9 %  sodium chloride infusion ( Intravenous New Bag/Given 09/24/16 1210)  iopamidol (ISOVUE-300) 61 % injection (not administered)  sodium chloride 0.9 % bolus 500 mL (0 mLs Intravenous Stopped 09/24/16 1433)  sodium chloride 0.9 % bolus 1,000 mL (1,000 mLs Intravenous New Bag/Given 09/24/16 1441)     Initial Impression / Assessment and Plan / ED Course  I have reviewed the triage vital signs and the nursing notes.  Pertinent labs & imaging results that were available during my care of the patient were reviewed by me and considered in my medical decision making (see chart for details).  Clinical Course    CRITICAL CARE Performed by: Vanetta Mulders Total critical care time: 30 minutes Critical care time was exclusive of separately billable procedures and treating other patients. Critical care  was necessary to treat or prevent imminent or life-threatening deterioration. Critical care was time spent personally by me on the following activities: development of treatment plan with patient and/or surrogate as well as nursing, discussions with consultants, evaluation of patient's response to treatment, examination of patient, obtaining history from patient or surrogate, ordering and performing treatments and interventions, ordering and review of laboratory studies, ordering and review of radiographic studies, pulse oximetry and re-evaluation of patient's condition.    Patient came in for concerns for large ventral umbilical hernia draining clear fluid. Patient forced to come in by his mother. Patient and his mother were concerned about the this ventral hernia. Patient stated days been nauseated and not eating and drinking well. Patient states she's known to have some liver problems. 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 These are all long-term problems. The ventral hernias  long-term as well.  Patient's presentation shifted years when his blood pressure was in the 70s even though he was alert and oriented. In addition patient's workup showed an elevated troponin at 0.11 the patient has not had any chest pain complaints. His defibrillator pacemaker has been neck in place and states that it has not fired. Elevated troponin probably related to the renal failure as well as the hypotension.  Initially the hypotension responded to IV fluids. Patient received 500 mL and now is receiving another liter. Blood pressures are still soft at around 90 systolic 88 systolic. Patient's lactic gases 2 have been normal do not feel that this is consistent with sepsis. Patient obvious renal failure part of it could be prerenal. Patient does have a leukocytosis but that could be hemoconcentration as well. Chest x-ray negative for any congestive heart failure. Pacemaker interrogated  results are still pending.  We'll continue fluids discussed with Dr. Robb Matar here patient will be transferred and admitted to the medicine service [facility]. May require consultation with urology and general surgery for the pre-existing of ventral hernia and fistula. Also discussed with Dr. Excell Seltzer from cardiology. They feel that the elevated troponin is most likely related to the hypotension and renal failure. However they are available for consultation as needed.    I personally performed the services described in this documentation, which was scribed in my presence. The recorded information has been reviewed and is accurate.   Final Clinical Impressions(s) / ED Diagnoses   Final diagnoses:  Acute renal failure, unspecified acute renal failure type (HCC)  Hypotension, unspecified hypotension type  Fistula    New Prescriptions New Prescriptions   No medications on file     Vanetta Mulders, MD 09/24/16 1553

## 2016-09-25 ENCOUNTER — Encounter (HOSPITAL_COMMUNITY): Payer: Self-pay | Admitting: Cardiology

## 2016-09-25 ENCOUNTER — Other Ambulatory Visit (HOSPITAL_COMMUNITY): Payer: Self-pay | Admitting: Radiology

## 2016-09-25 ENCOUNTER — Inpatient Hospital Stay (HOSPITAL_COMMUNITY): Payer: Medicaid Other

## 2016-09-25 DIAGNOSIS — N179 Acute kidney failure, unspecified: Secondary | ICD-10-CM

## 2016-09-25 DIAGNOSIS — N321 Vesicointestinal fistula: Secondary | ICD-10-CM

## 2016-09-25 DIAGNOSIS — I10 Essential (primary) hypertension: Secondary | ICD-10-CM

## 2016-09-25 DIAGNOSIS — K439 Ventral hernia without obstruction or gangrene: Secondary | ICD-10-CM

## 2016-09-25 DIAGNOSIS — M87051 Idiopathic aseptic necrosis of right femur: Secondary | ICD-10-CM

## 2016-09-25 DIAGNOSIS — K7031 Alcoholic cirrhosis of liver with ascites: Principal | ICD-10-CM

## 2016-09-25 DIAGNOSIS — K420 Umbilical hernia with obstruction, without gangrene: Secondary | ICD-10-CM

## 2016-09-25 HISTORY — PX: HERNIA REPAIR: SHX51

## 2016-09-25 LAB — CBC WITH DIFFERENTIAL/PLATELET
BASOS ABS: 0 10*3/uL (ref 0.0–0.1)
Basophils Relative: 0 %
EOS PCT: 1 %
Eosinophils Absolute: 0.1 10*3/uL (ref 0.0–0.7)
HCT: 30.6 % — ABNORMAL LOW (ref 39.0–52.0)
Hemoglobin: 10.5 g/dL — ABNORMAL LOW (ref 13.0–17.0)
LYMPHS ABS: 0.7 10*3/uL (ref 0.7–4.0)
LYMPHS PCT: 5 %
MCH: 31.7 pg (ref 26.0–34.0)
MCHC: 34.3 g/dL (ref 30.0–36.0)
MCV: 92.4 fL (ref 78.0–100.0)
MONO ABS: 1.1 10*3/uL — AB (ref 0.1–1.0)
MONOS PCT: 8 %
Neutro Abs: 12.2 10*3/uL — ABNORMAL HIGH (ref 1.7–7.7)
Neutrophils Relative %: 86 %
PLATELETS: 128 10*3/uL — AB (ref 150–400)
RBC: 3.31 MIL/uL — ABNORMAL LOW (ref 4.22–5.81)
RDW: 14.6 % (ref 11.5–15.5)
WBC: 14.1 10*3/uL — ABNORMAL HIGH (ref 4.0–10.5)

## 2016-09-25 LAB — RETICULOCYTES
RBC.: 3.31 MIL/uL — AB (ref 4.22–5.81)
RETIC COUNT ABSOLUTE: 53 10*3/uL (ref 19.0–186.0)
Retic Ct Pct: 1.6 % (ref 0.4–3.1)

## 2016-09-25 LAB — GLUCOSE, CAPILLARY
GLUCOSE-CAPILLARY: 106 mg/dL — AB (ref 65–99)
GLUCOSE-CAPILLARY: 101 mg/dL — AB (ref 65–99)
GLUCOSE-CAPILLARY: 105 mg/dL — AB (ref 65–99)
Glucose-Capillary: 100 mg/dL — ABNORMAL HIGH (ref 65–99)

## 2016-09-25 LAB — COMPREHENSIVE METABOLIC PANEL
ALBUMIN: 1.8 g/dL — AB (ref 3.5–5.0)
ALT: 26 U/L (ref 17–63)
AST: 59 U/L — AB (ref 15–41)
Alkaline Phosphatase: 328 U/L — ABNORMAL HIGH (ref 38–126)
Anion gap: 14 (ref 5–15)
BILIRUBIN TOTAL: 1.1 mg/dL (ref 0.3–1.2)
BUN: 60 mg/dL — AB (ref 6–20)
CO2: 18 mmol/L — AB (ref 22–32)
Calcium: 7.5 mg/dL — ABNORMAL LOW (ref 8.9–10.3)
Chloride: 98 mmol/L — ABNORMAL LOW (ref 101–111)
Creatinine, Ser: 2.56 mg/dL — ABNORMAL HIGH (ref 0.61–1.24)
GFR calc Af Amer: 31 mL/min — ABNORMAL LOW (ref >60)
GFR calc non Af Amer: 27 mL/min — ABNORMAL LOW (ref >60)
GLUCOSE: 100 mg/dL — AB (ref 65–99)
POTASSIUM: 4.4 mmol/L (ref 3.5–5.1)
Sodium: 130 mmol/L — ABNORMAL LOW (ref 135–145)
TOTAL PROTEIN: 6.6 g/dL (ref 6.5–8.1)

## 2016-09-25 LAB — PROTIME-INR
INR: 1.41
Prothrombin Time: 17.4 seconds — ABNORMAL HIGH (ref 11.4–15.2)

## 2016-09-25 LAB — IRON AND TIBC
Iron: 22 ug/dL — ABNORMAL LOW (ref 45–182)
Saturation Ratios: 9 % — ABNORMAL LOW (ref 17.9–39.5)
TIBC: 242 ug/dL — ABNORMAL LOW (ref 250–450)
UIBC: 220 ug/dL

## 2016-09-25 LAB — FOLATE: FOLATE: 16.3 ng/mL (ref >5.9)

## 2016-09-25 LAB — MRSA PCR SCREENING: MRSA by PCR: NEGATIVE

## 2016-09-25 LAB — SODIUM, URINE, RANDOM

## 2016-09-25 LAB — FERRITIN: Ferritin: 257 ng/mL (ref 24–336)

## 2016-09-25 LAB — VITAMIN B12: VITAMIN B 12: 2160 pg/mL — AB (ref 180–914)

## 2016-09-25 LAB — AMMONIA: Ammonia: 68 umol/L — ABNORMAL HIGH (ref 9–35)

## 2016-09-25 MED ORDER — ACETAMINOPHEN 325 MG PO TABS
650.0000 mg | ORAL_TABLET | Freq: Once | ORAL | Status: AC
  Administered 2016-09-25: 650 mg via ORAL
  Filled 2016-09-25: qty 2

## 2016-09-25 MED ORDER — ALBUMIN HUMAN 25 % IV SOLN
25.0000 g | Freq: Three times a day (TID) | INTRAVENOUS | Status: DC
  Administered 2016-09-25 – 2016-09-27 (×6): 25 g via INTRAVENOUS
  Filled 2016-09-25 (×6): qty 100

## 2016-09-25 MED ORDER — CHLORHEXIDINE GLUCONATE CLOTH 2 % EX PADS: 6.0000 | MEDICATED_PAD | Freq: Every day | CUTANEOUS | Status: DC

## 2016-09-25 MED ORDER — ENOXAPARIN SODIUM 40 MG/0.4ML ~~LOC~~ SOLN
40.0000 mg | SUBCUTANEOUS | Status: DC
  Administered 2016-09-25 – 2016-09-28 (×4): 40 mg via SUBCUTANEOUS
  Filled 2016-09-25 (×4): qty 0.4

## 2016-09-25 MED ORDER — MIDODRINE HCL 5 MG PO TABS
10.0000 mg | ORAL_TABLET | Freq: Three times a day (TID) | ORAL | Status: DC
  Administered 2016-09-25 – 2016-09-29 (×12): 10 mg via ORAL
  Filled 2016-09-25 (×11): qty 2

## 2016-09-25 MED ORDER — VANCOMYCIN HCL 10 G IV SOLR
1250.0000 mg | INTRAVENOUS | Status: DC
  Administered 2016-09-25 – 2016-09-27 (×3): 1250 mg via INTRAVENOUS
  Filled 2016-09-25 (×4): qty 1250

## 2016-09-25 MED ORDER — SODIUM CHLORIDE 0.9 % IV SOLN
INTRAVENOUS | Status: DC
  Administered 2016-09-25 – 2016-09-28 (×8): via INTRAVENOUS

## 2016-09-25 MED ORDER — SODIUM CHLORIDE 0.9 % IV BOLUS (SEPSIS)
1000.0000 mL | Freq: Once | INTRAVENOUS | Status: AC
  Administered 2016-09-25: 1000 mL via INTRAVENOUS

## 2016-09-25 MED ORDER — SODIUM BICARBONATE 650 MG PO TABS
1300.0000 mg | ORAL_TABLET | Freq: Two times a day (BID) | ORAL | Status: DC
  Administered 2016-09-25 – 2016-09-29 (×8): 1300 mg via ORAL
  Filled 2016-09-25 (×9): qty 2

## 2016-09-25 MED ORDER — SODIUM CHLORIDE 0.9 % IV SOLN
510.0000 mg | Freq: Once | INTRAVENOUS | Status: AC
  Administered 2016-09-25: 510 mg via INTRAVENOUS
  Filled 2016-09-25 (×2): qty 17

## 2016-09-25 MED ORDER — MUPIROCIN 2 % EX OINT
1.0000 "application " | TOPICAL_OINTMENT | Freq: Two times a day (BID) | CUTANEOUS | Status: DC
  Administered 2016-09-25 (×2): 1 via NASAL
  Filled 2016-09-25: qty 22

## 2016-09-25 NOTE — Consult Note (Signed)
Referring Provider: No ref. provider found Primary Care Physician:  Elige RadonJoshua A Dettinger, MD Primary Gastroenterologist:  Dr. Jonette EvaSandi Fields  Reason for Consultation:  Cirrhosis and ascites  HPI: Russell BarkMarty Trujillo is a 55 y.o. male with medical history significant of AICD implant, allergy, avascular necrosis of the right hip, depression, dysrhythmia, colo-vesicular fistula, ventral/umbilical hernia, GERD, gout, heart failure, hepatitis, hyperlipidemia, hypertension, morbid obesity,and ETOH cirrhosis who is coming to the emergency department due to abdominal pain and drainage from his umbilical hernia for the past 2 weeks after accidentally hitting his abdomen at home with his refrigerator. [facility].   Was seen by surgery and they recommended GI consult for help with his ascites fluid management related to his ascites.  He actually has been seen at [facility], last 05/2016, and has undergone several LVP.  The last was 07/22/2016 at which time 12.6 Liters were removed.  According to GI notes, he was supposed to take diuretics but was non-complaint.  Was also supposed to have an EGD to screen for varices but did not follow through with that either.  Has a known colovesical fistula and was supposed to [facility] that as well.  Has impaired renal function with Cr of 2.56.  Sodium 130.  BP's have been low.  CT scan abdomen and pelvis without contrast showed the following:  IMPRESSION: 1. Umbilical hernia containing fat and ascites. 2. 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 3. Possible recently passed 3 mm ureteral calculus within urinary bladder versus contrast that has entered the urinary bladder from the sigmoid colon to urinary bladder fistula. Some air is noted in urinary bladder. 4. Perihepatic and perisplenic ascites. 5. Moderate abdominal aortic atherosclerosis. 6. Avascular  necrosis of the right femoral head without cortical disruption. 7. Rectosigmoid and descending colon diverticula.   Past Medical History:  Diagnosis Date  . AICD (automatic cardioverter/defibrillator) present   . Allergy   . 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 Depression   . Fistula    Bladder Fistula  . GERD (gastroesophageal reflux disease)   . Gout   . Hepatitis    ? unsure   . Hyperlipidemia   . Hypertension     Past Surgical History:  Procedure Laterality Date  . COLONOSCOPY  Nov 2016   Dr. Rossie MuskratAl-Sabbagh at [facility]: two 6-8 mm polyps (tubular adenomas). Surveillance 2019.   Marland Kitchen. EP IMPLANTABLE DEVICE  2015   defibrillator/pacemaker  . heart catherization  2015   no blockages  . TENDON REPAIR  1986   right hand  . TUMOR REMOVAL  1995   right ear    Prior to Admission medications   Medication Sig Start Date End Date Taking? Authorizing Provider  aspirin 325 MG tablet Take 325 mg by mouth daily.   Yes Historical Provider, MD  fenofibrate (TRICOR) 145 MG tablet Take 1 tablet (145 mg total) by mouth daily. 02/20/16  Yes Elige RadonJoshua A Dettinger, MD  FLUoxetine (PROZAC) 10 MG capsule Take 10 mg by mouth daily.   Yes Historical Provider, MD  ibuprofen (ADVIL,MOTRIN) 800 MG tablet Take 800 mg by mouth every 8 (eight) hours as needed for moderate pain.    Yes Historical Provider, MD  lisinopril (PRINIVIL,ZESTRIL) 20 MG tablet TAKE 1 TABLET DAILY 07/30/16  Yes Elige RadonJoshua A Dettinger, MD  metoprolol succinate (TOPROL-XL) 25 MG 24 hr tablet TAKE 1 TABLET DAILY  08/06/16  Yes Elige Radon Dettinger, MD    Current Facility-Administered Medications  Medication Dose Route Frequency Provider Last Rate Last Dose  . 0.9 %  sodium chloride infusion   Intravenous Continuous Bobette Mo, MD 125 mL/hr at 09/25/16 [phone]    . aspirin tablet 325 mg  325 mg Oral Daily Bobette Mo, MD   325 mg at 09/25/16 9604  . Chlorhexidine Gluconate Cloth 2 % PADS 6 each  6 each Topical Q0600 Bobette Mo, MD      . enoxaparin (LOVENOX) injection 30 mg  30 mg Subcutaneous Q24H Bobette Mo, MD   30 mg at 09/25/16 0139  . fenofibrate tablet 160 mg  160 mg Oral Daily Bobette Mo, MD   160 mg at 09/25/16 0902  . FLUoxetine (PROZAC) capsule 10 mg  10 mg Oral Daily Bobette Mo, MD   10 mg at 09/25/16 5409  . insulin aspart (novoLOG) injection 0-9 Units  0-9 Units Subcutaneous TID WC Bobette Mo, MD      . mupirocin ointment (BACTROBAN) 2 % 1 application  1 application Nasal BID Bobette Mo, MD   1 application at 09/25/16 0902  . piperacillin-tazobactam (ZOSYN) IVPB 3.375 g  3.375 g Intravenous Q8H Kendra P Hiatt, RPH   3.375 g at 09/25/16 1306  . sodium chloride 0.9 % bolus 1,000 mL  1,000 mL Intravenous Once Lenox Ponds, MD      . vancomycin (VANCOCIN) 1,250 mg in sodium chloride 0.9 % 250 mL IVPB  1,250 mg Intravenous Q24H Lauren D Bajbus, RPH        Allergies as of 09/24/2016 - Review Complete 09/24/2016  Allergen Reaction Noted  . Coreg [carvedilol] Swelling 12/27/2015    Family History  Problem Relation Age of Onset  . Breast cancer Mother   . Depression Mother   . Diabetes Mother   . Hearing loss Mother   . Hyperlipidemia Mother   . Hypertension Mother   . Mental illness Mother   . Vision loss Mother   . Alcohol abuse Father   . Arthritis Father   . Early death Father   . Hyperlipidemia Father   . Hypertension Father   . Vision loss Father   . Colon cancer Paternal Grandmother   . Liver cancer Cousin   . Colon polyps Neg Hx     Social History   Social History  . Marital status: Legally Separated    Spouse name: N/A  . Number of children: N/A  . Years of education: N/A   Occupational History  . Not on file.   Social History Main Topics  . Smoking status: Current Every Day Smoker    Packs/day: 1.00    Years: 40.00    Types: Cigarettes  . Smokeless tobacco: Never Used  . Alcohol use 2.4 oz/week    4 Shots of liquor per  week  . Drug use: No  . Sexual activity: No   Other Topics Concern  . Not on file   Social History Narrative  . No narrative on file    Review of Systems: ROS is negative except as mentioned in HPI.  Physical Exam: Vital signs in last 24 hours: Temp:  [97.8 F (36.6 C)-98 F (36.7 C)] 98 F (36.7 C) (12/01 1000) Pulse Rate:  [73-88] 73 (12/01 1000) Resp:  [13-23] 18 (12/01 1000) BP: (76-103)/(27-66) 76/43 (12/01 1000) SpO2:  [97 %-100 %] 99 % (12/01 1000) Weight:  [811  lb 10.9 oz (112.8 kg)] 248 lb 10.9 oz (112.8 kg) (11/30 2000) Last BM Date: 09/25/16 General:  Alert, appears older than stated age and chronically ill but pleasant and cooperative in NAD Head:  Normocephalic and atraumatic. Eyes:  Sclera clear, no icterus.  Conjunctiva pink. Ears:  Normal auditory acuity. Mouth:  No deformity or lesions.   Lungs:  Clear throughout to auscultation.  No wheezes, crackles, or rhonchi.  Heart:  Regular rate and rhythm; no murmurs, clicks, rubs, or gallops. Abdomen:  Soft, large umbilical hernia noted but it is covered with a dressing.  There is ascites but it is decompressed around the hernia site.  BS present.  Some mild diffuse TTP. Rectal:  Deferred.  Msk:  Symmetrical without gross deformities. Pulses:  Normal pulses noted. Extremities:  Mild B/L LE edema. Neurologic:  Alert and oriented x 4;  grossly normal neurologically. Skin:  Intact without significant lesions or rashes. Psych:  Alert and cooperative. Normal mood and affect.  Intake/Output from previous day: No intake/output data recorded. Intake/Output this shift: Total I/O In: 360 [P.O.:360] Out: 100 [Urine:100]  Lab Results:  Recent Labs  09/24/16 1132 09/25/16 0516  WBC 14.7* 14.1*  HGB 11.6* 10.5*  HCT 33.2* 30.6*  PLT 118* 128*   BMET  Recent Labs  09/24/16 1132 09/25/16 0516  NA 126* 130*  K 5.1 4.4  CL 91* 98*  CO2 22 18*  GLUCOSE 84 100*  BUN 72* 60*  CREATININE 3.42* 2.56*    CALCIUM 8.9 7.5*   LFT  Recent Labs  09/25/16 0516  PROT 6.6  ALBUMIN 1.8*  AST 59*  ALT 26  ALKPHOS 328*  BILITOT 1.1   PT/INR  Recent Labs  09/24/16 1132 09/25/16 0748  LABPROT 15.5* 17.4*  INR 1.22 1.41   Studies/Results: Ct Abdomen Pelvis Wo Contrast  Result Date: 09/24/2016 CLINICAL DATA:  Central and abdominal pain, history of ventral hernia, abdominal distention, decreased appetite EXAM: CT ABDOMEN AND PELVIS WITHOUT CONTRAST TECHNIQUE: Multidetector CT imaging of the abdomen and pelvis was performed following the standard protocol without IV contrast. COMPARISON:  CT abdomen pelvis of 01/24/2016 FINDINGS: Lower chest: The lung bases are clear.  Mild cardiomegaly is stable. Hepatobiliary: The liver enhances with no focal abnormality. The contours the liver again are somewhat nodular suggesting cirrhosis. A small amount of perihepatic ascites is noted. The gallbladder appears decompressed. Pancreas: The pancreas is normal in size and the pancreatic duct is not dilated. Spleen: There is splenomegaly noted. There is a small amount of perisplenic ascites within the left upper quadrant. Varices are present within the upper abdomen. Adrenals/Urinary Tract: The adrenal glands appear normal. No renal calculi are seen and no hydronephrosis is noted. A small cyst emanates from the or lower pole of the right kidney anteriorly and is unchanged. Ureters are normal in caliber. The urinary bladder is decompressed and difficult to assess. However the does appear to be a calcification within the urinary bladder which could represent a recently passed ureteral calculus of 3 mm in diameter. 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 The questioned calculus could represent some contrast that has entered through the fistulous communication into the urinary bladder rather than a calculus.  Stomach/Bowel: The stomach is moderately distended with oral contrast food debris and air. No abnormality is seen. No small bowel distention is seen. There are rectosigmoid and descending colon diverticula present and  there is fluid throughout the left colon in particular. Vascular/Lymphatic: The abdominal aorta is normal in caliber with moderate abdominal aortic atherosclerosis present. No adenopathy is seen. Reproductive: The prostate is normal in size. Other: There is umbilical hernia present containing fat and ascites. No definite bowel is seen to enter this hernia sac. Also, fat enters the left inguinal canal. Musculoskeletal: There is sclerosis within the right femoral head without cortical disruption most consistent with avascular necrosis. The left femoral head is unremarkable. The lumbar vertebrae are in normal alignment with normal intervertebral disc spaces. IMPRESSION: 1. Umbilical hernia containing fat and ascites. 2. 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 3. Possible recently passed 3 mm ureteral calculus within urinary bladder versus contrast that has entered the urinary bladder from the sigmoid colon to urinary bladder fistula. Some air is noted in urinary bladder. 4. Perihepatic and perisplenic ascites. 5. Moderate abdominal aortic atherosclerosis. 6. 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 7. Rectosigmoid and descending colon diverticula Electronically Signed   By: Dwyane Dee M.D.   On: 09/24/2016 15:05   Dg Chest Port 1 View  Result Date: 09/24/2016 CLINICAL DATA:  Hypotension EXAM: PORTABLE CHEST 1 VIEW COMPARISON:  None. FINDINGS: Heart size within normal limits. Negative for heart failure. Lungs are clear without infiltrate or effusion. 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 IMPRESSION: No active disease. Electronically Signed   By: Marlan Palau M.D.   On: 09/24/2016 12:13     IMPRESSION:  *55 year old male with ETOH cirrhosis, and ascites with several large volume paracentesis performed.  Follows with [facility].  Has been non-compliant with regular follow-up and their recommendations regarding low-sodium intake, diuretic use, screening EGD, etc. *Known colovesical fistula:  Was supposed to be seen by surgeon at [facility].  *Umbilical/ventral hernia with leaking ascitic fluid:  Surgery saw him here and he is a high risk surgical candidate.  Also will not perform until ascites under better control. *ETOH abuse:  He tells me no ETOH for a couple of months.  PLAN: -Diuretic use may be difficult right now due to low/soft BP's and renal insufficiency.  Recommend renal consult. -Will order paracentesis with 1 Liter of fluid removed to rule out SBP.  Check fluid for cell count, culture. -Unfortunately we are limited on options regarding management of his ascites.  He is at risk for SBP with this draining from the umbilical site.  Is on antibiotics currently.  ZEHR, JESSICA D.  09/25/2016, 1:44 PM Pager number [phone]  GI ATTENDING  History, laboratories, x-rays, limited outside records reviewed. Patient personally seen and examined. Agree with comprehensive consultation note as outlined above Extremely complicated gentleman with multiple medical problems including advanced alcoholic liver disease with ongoing alcohol use. Current problems include decompensated liver disease, ruptured umbilical hernia, significant renal insufficiency, chronic colovesical fistula, and chronic heart disease. From a GI standpoint we recommend complete abstinence from alcohol, no more than 2 g of sodium daily, renal consult regarding his renal insufficiency and options, if any, for safe diuresis, surgical opinion regarding ruptured umbilicus, limited (no more than 1 L) paracentesis for relief of abdominal pressure and rule out SBP. On antibiotics. Not much else to offer for his chronic issues at  this time. After discharge, he should resume his GI care with his primary GI team. However, we are available for questions or problems while he is inpatient. Discussed with patient and [facility] hospitalists attending. Will sign off.  Thank you.  Wilhemina BonitoJohn N. Eda KeysPerry, Jr., M.D. [facility] Division of Gastroenterology

## 2016-09-25 NOTE — Progress Notes (Signed)
PROGRESS NOTE [facility]   Lizbeth BarkMarty Sindelar   EAV:[id number]:[id number] DOB: 25-Jul-1961  DOA: 09/24/2016 PCP: Elige RadonJoshua A Dettinger, MD   Brief Narrative:  55 year old male with history of AICD implant, CHF, a vascular necrosis of the right hip, vesicorectal fistula, peritoneal hernia, heart failure, GERD, Chronic liver diseases and obesity. Sent to the emergency department at [facility] due to increasing abdominal pain and drainage from his umbilical hernia that this has been occurring for the past 2 weeks. Patient was [facility]  for multidisciplinary approach surgical, nephrology and gastroenterology evaluation. Being managed for drainage from ventral hernia with antibiotics. Patient was found to have large amounts of ascites.  Patient have been noncompliant therapy he was supposed to have EGD to screen for diuresis but never was done also not taking diuretics as prescribed. Was supposed to see surgeon at [facility] colovesical fistula with never follow.  Subjective: Patient seen and examined this morning. Report feeling okay, no acute events, afebrile hernia continues to drain.  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, no changes on images  Pain control PRN    DVT prophylaxis: Lovenox Code Status: Full Family Communication: None at bedside Disposition Plan: Poor prognosis, anticipate patient to admitted for several days  Consultants:   GI  Nephro  Cardio  Gen Surgery  Procedures:   For Paracentesis   Antimicrobials:  Zosyn 11/30  Vanc 11/30   Objective: Vitals:   09/24/16 2028 09/25/16 0509 09/25/16 0515 09/25/16 1000  BP: (!) 103/46  (!) 96/50 (!) 76/43  Pulse: 85 76  73  Resp: 18 18  18   Temp: 98 F (36.7 C) 97.8 F (36.6 C)  98 F (36.7 C)  TempSrc: Oral Oral  Oral  SpO2: 100% 100%  99%  Weight:      Height:        Intake/Output Summary (Last 24 hours) at 09/25/16 1709 Last data filed at 09/25/16 1527  Gross per 24 hour  Intake              600 ml  Output              100 ml  Net              500 ml   Filed Weights   09/24/16 2000  Weight: 112.8 kg (248 lb 10.9 oz)    Examination:  General exam: Appears calm and comfortable  HEENT: AC/AT, PERRLA, OP moist and clear Respiratory system: Clear to auscultation. No wheezes,crackle or rhonchi Cardiovascular system: S1 & S2 heard, RRR. No JVD, murmurs, rubs or gallops Gastrointestinal system: Obese, Abd soft, large umbilical hernia with purulent drainage, + ascites, +BS Mild tenderness to palpation, rectal  deferred  Central nervous system: Alert and oriented. No focal neurological deficits. Extremities: No pedal edema. Symmetric, strength 5/5   Skin: Multiple ecchymotic lesions  No rashes, lesions or ulcers Psychiatry: Judgement and insight appear normal. Mood & affect appropriate.    Data Reviewed: I have personally reviewed following labs and imaging studies  CBC:  Recent Labs Lab 09/24/16 1132 09/25/16 0516  WBC 14.7* 14.1*  NEUTROABS 12.8* 12.2*  HGB 11.6* 10.5*  HCT 33.2* 30.6*  MCV 94.9 92.4  PLT 118* 128*   Basic Metabolic Panel:  Recent Labs Lab 09/24/16 1132  09/24/16 1138 09/25/16 0516  NA 126*  --  130*  K 5.1  --  4.4  CL 91*  --  98*  CO2 22  --  18*  GLUCOSE 84  --  100*  BUN 72*  --  60*  CREATININE 3.42*  --  2.56*  CALCIUM 8.9  --  7.5*  MG  --  2.2  --    GFR: Estimated Creatinine Clearance: 39.8 mL/min (by C-G formula based on SCr of 2.56 mg/dL (H)). Liver Function Tests:  Recent Labs Lab 09/24/16 1132 09/25/16 0516  AST 54* 59*  ALT 24 26  ALKPHOS 303* 328*  BILITOT 0.8 1.1  PROT 7.2 6.6  ALBUMIN 2.2* 1.8*    Recent Labs Lab 09/24/16 1206  LIPASE 44    Recent Labs Lab 09/25/16 0516  AMMONIA 68*   Coagulation Profile:  Recent Labs Lab 09/24/16 1132 09/25/16 0748  INR 1.22 1.41   Cardiac Enzymes:  Recent Labs Lab 09/24/16 2225  TROPONINI <0.03   BNP (last 3 results) No results for input(s): PROBNP in the last 8760 hours. HbA1C: No results for input(s): HGBA1C in the last 72 hours. CBG:  Recent Labs Lab 09/24/16 2025 09/24/16 2112 09/24/16 2142 09/25/16 0803 09/25/16 1143  GLUCAP 58* 62* 117* 106* 101*   Lipid Profile: No results for input(s): CHOL, HDL, LDLCALC, TRIG, CHOLHDL, LDLDIRECT in the last 72 hours. Thyroid Function Tests: No results for input(s): TSH, T4TOTAL, FREET4, T3FREE, THYROIDAB in the last 72 hours. Anemia Panel:  Recent Labs  09/25/16 0516  VITAMINB12 2,160*  FOLATE 16.3  FERRITIN 257  TIBC 242*  IRON 22*  RETICCTPCT 1.6   Sepsis Labs:  Recent Labs Lab 09/24/16 1145 09/24/16 1452  LATICACIDVEN 1.71 1.65    Recent Results (from the past 240 hour(s))  Culture, blood (Routine x 2)     Status: None (Preliminary result)   Collection Time: 09/24/16 11:32 AM  Result Value Ref Range Status   Specimen Description BLOOD LEFT ANTECUBITAL DRAWN BY RN  Final   Special Requests   Final    BOTTLES DRAWN AEROBIC AND ANAEROBIC AEB=6CC ANA=8CC   Culture NO GROWTH < 24 HOURS  Final   Report Status PENDING  Incomplete  Culture, blood (Routine x 2)     Status:  None (Preliminary result)   Collection Time: 09/24/16 11:38 AM  Result Value Ref Range Status   Specimen Description BLOOD LEFT ANTECUBITAL DRAWN BY RN  Final   Special Requests BOTTLES DRAWN AEROBIC AND ANAEROBIC 6CC   Final   Culture NO GROWTH < 24 HOURS  Final   Report Status PENDING  Incomplete  MRSA PCR Screening     Status: None   Collection Time: 09/25/16  8:02 AM  Result Value Ref Range Status   MRSA by PCR NEGATIVE NEGATIVE Final    Comment:        The GeneXpert MRSA  Assay (FDA approved for NASAL specimens only), is one component of a comprehensive MRSA colonization surveillance program. It is not intended to diagnose MRSA infection nor to guide or monitor treatment for MRSA infections.      Radiology Studies: Ct Abdomen Pelvis Wo Contrast  Result Date: 09/24/2016 CLINICAL DATA:  Central and abdominal pain, history of ventral hernia, abdominal distention, decreased appetite EXAM: CT ABDOMEN AND PELVIS WITHOUT CONTRAST TECHNIQUE: Multidetector CT imaging of the abdomen and pelvis was performed following the standard protocol without IV contrast. COMPARISON:  CT abdomen pelvis of 01/24/2016 FINDINGS: Lower chest: The lung bases are clear.  Mild cardiomegaly is stable. Hepatobiliary: The liver enhances with no focal abnormality. The contours the liver again are somewhat nodular suggesting cirrhosis. A small amount of perihepatic ascites is noted. The gallbladder appears decompressed. Pancreas: The pancreas is normal in size and the pancreatic duct is not dilated. Spleen: There is splenomegaly noted. There is a small amount of perisplenic ascites within the left upper quadrant. Varices are present within the upper abdomen. Adrenals/Urinary Tract: The adrenal glands appear normal. No renal calculi are seen and no hydronephrosis is noted. A small cyst emanates from the or lower pole of the right kidney anteriorly and is unchanged. Ureters are normal in caliber. The urinary bladder is  decompressed and difficult to assess. However the does appear to be a calcification within the urinary bladder which could represent a recently passed ureteral calculus of 3 mm in diameter. 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 The questioned calculus could represent some contrast that has entered through the fistulous communication into the urinary bladder rather than a calculus. Stomach/Bowel: The stomach is moderately distended with oral contrast food debris and air. No abnormality is seen. No small bowel distention is seen. There are rectosigmoid and descending colon diverticula present and there is fluid throughout the left colon in particular. Vascular/Lymphatic: The abdominal aorta is normal in caliber with moderate abdominal aortic atherosclerosis present. No adenopathy is seen. Reproductive: The prostate is normal in size. Other: There is umbilical hernia present containing fat and ascites. No definite bowel is seen to enter this hernia sac. Also, fat enters the left inguinal canal. Musculoskeletal: There is sclerosis within the right femoral head without cortical disruption most consistent with avascular necrosis. The left femoral head is unremarkable. The lumbar vertebrae are in normal alignment with normal intervertebral disc spaces. IMPRESSION: 1. Umbilical hernia containing fat and ascites. 2. 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 3. Possible recently passed 3 mm ureteral calculus within urinary bladder versus contrast that has entered the urinary bladder from the sigmoid colon to urinary bladder fistula. Some air is noted in urinary bladder. 4. Perihepatic and perisplenic ascites. 5. Moderate abdominal aortic atherosclerosis. 6. 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 7. Rectosigmoid  and descending colon diverticula Electronically Signed   By: Dwyane Dee M.D.   On: 09/24/2016 15:05   Dg Chest Port 1 View  Result Date: 09/24/2016 CLINICAL DATA:  Hypotension EXAM: PORTABLE CHEST 1 VIEW COMPARISON:  None. FINDINGS: Heart size within normal limits. Negative for heart failure. Lungs are clear without infiltrate or effusion. 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 IMPRESSION: No active disease. Electronically Signed   By: Marlan Palau M.D.   On: 09/24/2016 12:13    Scheduled Meds: . albumin human  25 g Intravenous TID  . aspirin  325 mg Oral Daily  . Chlorhexidine Gluconate Cloth  6 each Topical Q0600  . enoxaparin (LOVENOX) injection  40 mg Subcutaneous Q24H  . fenofibrate  160 mg Oral Daily  . ferumoxytol  510 mg Intravenous Once  . FLUoxetine  10 mg Oral Daily  . insulin aspart  0-9 Units Subcutaneous TID WC  . midodrine  10 mg Oral TID WC  . mupirocin ointment  1 application Nasal BID  . piperacillin-tazobactam (ZOSYN)  IV  3.375 g Intravenous Q8H  . sodium bicarbonate  1,300 mg Oral BID  . vancomycin  1,250 mg Intravenous Q24H   Continuous Infusions: . sodium chloride 125 mL/hr at 09/25/16 0752  . sodium chloride       LOS: 1 day    Latrelle DodrillEdwin Silva, MD [facility] Pager [phone]  If 7PM-7AM, please contact night-coverage [email or web address] Password [email or web address] 09/25/2016, 5:09 PM

## 2016-09-25 NOTE — Consult Note (Signed)
Reason for Consult:AKI Referring Physician: Dr. Meredith Mody Russell Trujillo is an 55 y.o. male.  HPI: 55 yr male with hx AICD for arrhythmias, ASN hip, obesity, HTN, Rectovesical fistua, hepatitis ???, ETOH abuse , cirrhosis, who presented to [facility] for drainage from umb hernia where it had been trauatized, poor appetite, occ D, fatigue, decreased urine vol.  Cr 8/17 was 1.18 , now 2.5 - 3 on multiple occ.  Notes no fevers, chill, or rash, no NSAIDS, but has been on Lisinopril and bps now 70-90 sys.  Notes some edema but not SOB. Abdm with fluid but not tight.  Has has LV paracenteses but last about 3 mon ago.  No diuretics. No itching but some cramping. Nocturia x 4 and freq during day until recently Constitutional: as above Eyes: glasses Ears, nose, mouth, throat, and face: negative Respiratory: negative Cardiovascular: AICD Gastrointestinal: drainage from umb, swollen but not as much as past Genitourinary:as above Integument/breast: negative Hematologic/lymphatic: negative Musculoskeletal:negative Endocrine: negative Allergic/Immunologic: negative except for coreg     Past Medical History:  Diagnosis Date  . AICD (automatic cardioverter/defibrillator) present   . Allergy   . 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 Depression   . Fistula    Bladder Fistula  . GERD (gastroesophageal reflux disease)   . Gout   . Hepatitis    ? unsure   . Hyperlipidemia   . Hypertension     Past Surgical History:  Procedure Laterality Date  . COLONOSCOPY  Nov 2016   Dr. Berkley Harvey at [place]: two 6-8 mm polyps (tubular adenomas). Surveillance 2019.   Marland Kitchen EP IMPLANTABLE DEVICE  2015   defibrillator/pacemaker  . heart catherization  2015   no blockages  . [place]   right hand  . TUMOR REMOVAL  1995   right ear    Family History  Problem Relation Age of Onset  . Breast cancer Mother   . Depression Mother   . Diabetes Mother   . Hearing loss Mother   . Hyperlipidemia Mother    . Hypertension Mother   . Mental illness Mother   . Vision loss Mother   . Alcohol abuse Father   . Arthritis Father   . Early death Father   . Hyperlipidemia Father   . Hypertension Father   . Vision loss Father   . Colon cancer Paternal Grandmother   . Liver cancer Cousin   . Colon polyps Neg Hx     Social History:  reports that he has been smoking Cigarettes.  He has a 40.00 pack-year smoking history. He has never used smokeless tobacco. He reports that he drinks about 2.4 oz of alcohol per week . He reports that he does not use drugs.  Allergies:  Allergies  Allergen Reactions  . Coreg [Carvedilol] Swelling    Swelling of lips    Medications:  I have reviewed the patient's current medications. Prior to Admission:  Prescriptions Prior to Admission  Medication Sig Dispense Refill Last Dose  . aspirin 325 MG tablet Take 325 mg by mouth daily.   09/23/2016 at Unknown time  . fenofibrate (TRICOR) 145 MG tablet Take 1 tablet (145 mg total) by mouth daily. 90 tablet 1 09/23/2016 at Unknown time  . FLUoxetine (PROZAC) 10 MG capsule Take 10 mg by mouth daily.   09/23/2016 at Unknown time  . ibuprofen (ADVIL,MOTRIN) 800 MG tablet Take 800 mg by mouth every 8 (eight) hours as needed for moderate pain.  09/24/2016 at Unknown time  . lisinopril (PRINIVIL,ZESTRIL) 20 MG tablet TAKE 1 TABLET DAILY 30 tablet 3 09/24/2016 at Unknown time  . metoprolol succinate (TOPROL-XL) 25 MG 24 hr tablet TAKE 1 TABLET DAILY 30 tablet 3 09/24/2016 at 0600    Results for orders placed or performed during the hospital encounter of 09/24/16 (from the past 48 hour(s))  Urinalysis, Routine w reflex microscopic     Status: Abnormal   Collection Time: 09/24/16 11:28 AM  Result Value Ref Range   Color, Urine BROWN (A) YELLOW    Comment: BIOCHEMICALS MAY BE AFFECTED BY COLOR   APPearance CLOUDY (A) CLEAR   Specific Gravity, Urine 1.015 1.005 - 1.030   pH 8.0 5.0 - 8.0   Glucose, UA 100 (A) NEGATIVE  mg/dL   Hgb urine dipstick LARGE (A) NEGATIVE   Bilirubin Urine LARGE (A) NEGATIVE   Ketones, ur TRACE (A) NEGATIVE mg/dL   Protein, ur 100 (A) NEGATIVE mg/dL   Nitrite NEGATIVE NEGATIVE   Leukocytes, UA LARGE (A) NEGATIVE  Urine microscopic-add on     Status: Abnormal   Collection Time: 09/24/16 11:28 AM  Result Value Ref Range   Squamous Epithelial / LPF 0-5 (A) NONE SEEN   WBC, UA 6-30 0 - 5 WBC/hpf   RBC / HPF 6-30 0 - 5 RBC/hpf   Bacteria, UA MANY (A) NONE SEEN  Comprehensive metabolic panel     Status: Abnormal   Collection Time: 09/24/16 11:32 AM  Result Value Ref Range   Sodium 126 (L) 135 - 145 mmol/L   Potassium 5.1 3.5 - 5.1 mmol/L   Chloride 91 (L) 101 - 111 mmol/L   CO2 22 22 - 32 mmol/L   Glucose, Bld 84 65 - 99 mg/dL   BUN 72 (H) 6 - 20 mg/dL   Creatinine, Ser 3.42 (H) 0.61 - 1.24 mg/dL   Calcium 8.9 8.9 - 10.3 mg/dL   Total Protein 7.2 6.5 - 8.1 g/dL   Albumin 2.2 (L) 3.5 - 5.0 g/dL   AST 54 (H) 15 - 41 U/L   ALT 24 17 - 63 U/L   Alkaline Phosphatase 303 (H) 38 - 126 U/L   Total Bilirubin 0.8 0.3 - 1.2 mg/dL   GFR calc non Af Amer 19 (L) >60 mL/min   GFR calc Af Amer 22 (L) >60 mL/min    Comment: (NOTE) The eGFR has been calculated using the CKD EPI equation. This calculation has not been validated in all clinical situations. eGFR's persistently <60 mL/min signify possible Chronic Kidney Disease.    Anion gap 13 5 - 15  CBC with Differential     Status: Abnormal   Collection Time: 09/24/16 11:32 AM  Result Value Ref Range   WBC 14.7 (H) 4.0 - 10.5 K/uL   RBC 3.50 (L) 4.22 - 5.81 MIL/uL   Hemoglobin 11.6 (L) 13.0 - 17.0 g/dL   HCT 33.2 (L) 39.0 - 52.0 %   MCV 94.9 78.0 - 100.0 fL   MCH 33.1 26.0 - 34.0 pg   MCHC 34.9 30.0 - 36.0 g/dL   RDW 14.7 11.5 - 15.5 %   Platelets 118 (L) 150 - 400 K/uL    Comment: SPECIMEN CHECKED FOR CLOTS PLATELET COUNT CONFIRMED BY SMEAR    Neutrophils Relative % 87 %   Neutro Abs 12.8 (H) 1.7 - 7.7 K/uL   Lymphocytes  Relative 6 %   Lymphs Abs 0.8 0.7 - 4.0 K/uL   Monocytes Relative 7 %  Monocytes Absolute 1.0 0.1 - 1.0 K/uL   Eosinophils Relative 1 %   Eosinophils Absolute 0.1 0.0 - 0.7 K/uL   Basophils Relative 0 %   Basophils Absolute 0.0 0.0 - 0.1 K/uL  Protime-INR     Status: Abnormal   Collection Time: 09/24/16 11:32 AM  Result Value Ref Range   Prothrombin Time 15.5 (H) 11.4 - 15.2 seconds   INR 1.22   Culture, blood (Routine x 2)     Status: None (Preliminary result)   Collection Time: 09/24/16 11:32 AM  Result Value Ref Range   Specimen Description BLOOD LEFT ANTECUBITAL DRAWN BY RN    Special Requests      BOTTLES DRAWN AEROBIC AND ANAEROBIC AEB=6CC ANA=8CC   Culture NO GROWTH < 24 HOURS    Report Status PENDING   Culture, blood (Routine x 2)     Status: None (Preliminary result)   Collection Time: 09/24/16 11:38 AM  Result Value Ref Range   Specimen Description BLOOD LEFT ANTECUBITAL DRAWN BY RN    Special Requests BOTTLES DRAWN AEROBIC AND ANAEROBIC 6CC     Culture NO GROWTH < 24 HOURS    Report Status PENDING   Magnesium     Status: None   Collection Time: 09/24/16 11:38 AM  Result Value Ref Range   Magnesium 2.2 1.7 - 2.4 mg/dL  Ethanol     Status: Abnormal   Collection Time: 09/24/16 11:38 AM  Result Value Ref Range   Alcohol, Ethyl (B) 6 (H) <5 mg/dL    Comment:        LOWEST DETECTABLE LIMIT FOR SERUM ALCOHOL IS 5 mg/dL FOR MEDICAL PURPOSES ONLY   I-Stat CG4 Lactic Acid, ED     Status: None   Collection Time: 09/24/16 11:45 AM  Result Value Ref Range   Lactic Acid, Venous 1.71 0.5 - 1.9 mmol/L  Brain natriuretic peptide     Status: None   Collection Time: 09/24/16 12:06 PM  Result Value Ref Range   B Natriuretic Peptide 39.0 0.0 - 100.0 pg/mL  Lipase, blood     Status: None   Collection Time: 09/24/16 12:06 PM  Result Value Ref Range   Lipase 44 11 - 51 U/L  I-stat troponin, ED     Status: Abnormal   Collection Time: 09/24/16  1:57 PM  Result Value Ref Range    Troponin i, poc 0.11 (HH) 0.00 - 0.08 ng/mL   Comment NOTIFIED PHYSICIAN    Comment 3            Comment: Due to the release kinetics of cTnI, a negative result within the first hours of the onset of symptoms does not rule out myocardial infarction with certainty. If myocardial infarction is still suspected, repeat the test at appropriate intervals.   I-Stat CG4 Lactic Acid, ED     Status: None   Collection Time: 09/24/16  2:52 PM  Result Value Ref Range   Lactic Acid, Venous 1.65 0.5 - 1.9 mmol/L  Glucose, capillary     Status: Abnormal   Collection Time: 09/24/16  8:25 PM  Result Value Ref Range   Glucose-Capillary 58 (L) 65 - 99 mg/dL  Glucose, capillary     Status: Abnormal   Collection Time: 09/24/16  9:12 PM  Result Value Ref Range   Glucose-Capillary 62 (L) 65 - 99 mg/dL  Glucose, capillary     Status: Abnormal   Collection Time: 09/24/16  9:42 PM  Result Value Ref Range   Glucose-Capillary  117 (H) 65 - 99 mg/dL  Troponin I (q 6hr x 3)     Status: None   Collection Time: 09/24/16 10:25 PM  Result Value Ref Range   Troponin I <0.03 <0.03 ng/mL  Comprehensive metabolic panel     Status: Abnormal   Collection Time: 09/25/16  5:16 AM  Result Value Ref Range   Sodium 130 (L) 135 - 145 mmol/L   Potassium 4.4 3.5 - 5.1 mmol/L   Chloride 98 (L) 101 - 111 mmol/L   CO2 18 (L) 22 - 32 mmol/L   Glucose, Bld 100 (H) 65 - 99 mg/dL   BUN 60 (H) 6 - 20 mg/dL   Creatinine, Ser 2.56 (H) 0.61 - 1.24 mg/dL   Calcium 7.5 (L) 8.9 - 10.3 mg/dL   Total Protein 6.6 6.5 - 8.1 g/dL   Albumin 1.8 (L) 3.5 - 5.0 g/dL   AST 59 (H) 15 - 41 U/L   ALT 26 17 - 63 U/L   Alkaline Phosphatase 328 (H) 38 - 126 U/L   Total Bilirubin 1.1 0.3 - 1.2 mg/dL   GFR calc non Af Amer 27 (L) >60 mL/min   GFR calc Af Amer 31 (L) >60 mL/min    Comment: (NOTE) The eGFR has been calculated using the CKD EPI equation. This calculation has not been validated in all clinical situations. eGFR's persistently <60  mL/min signify possible Chronic Kidney Disease.    Anion gap 14 5 - 15  CBC WITH DIFFERENTIAL     Status: Abnormal   Collection Time: 09/25/16  5:16 AM  Result Value Ref Range   WBC 14.1 (H) 4.0 - 10.5 K/uL   RBC 3.31 (L) 4.22 - 5.81 MIL/uL   Hemoglobin 10.5 (L) 13.0 - 17.0 g/dL   HCT 30.6 (L) 39.0 - 52.0 %   MCV 92.4 78.0 - 100.0 fL   MCH 31.7 26.0 - 34.0 pg   MCHC 34.3 30.0 - 36.0 g/dL   RDW 14.6 11.5 - 15.5 %   Platelets 128 (L) 150 - 400 K/uL   Neutrophils Relative % 86 %   Neutro Abs 12.2 (H) 1.7 - 7.7 K/uL   Lymphocytes Relative 5 %   Lymphs Abs 0.7 0.7 - 4.0 K/uL   Monocytes Relative 8 %   Monocytes Absolute 1.1 (H) 0.1 - 1.0 K/uL   Eosinophils Relative 1 %   Eosinophils Absolute 0.1 0.0 - 0.7 K/uL   Basophils Relative 0 %   Basophils Absolute 0.0 0.0 - 0.1 K/uL  Vitamin B12     Status: Abnormal   Collection Time: 09/25/16  5:16 AM  Result Value Ref Range   Vitamin B-12 2,160 (H) 180 - 914 pg/mL    Comment: (NOTE) This assay is not validated for testing neonatal or myeloproliferative syndrome specimens for Vitamin B12 levels.   Folate     Status: None   Collection Time: 09/25/16  5:16 AM  Result Value Ref Range   Folate 16.3 >5.9 ng/mL  Iron and TIBC     Status: Abnormal   Collection Time: 09/25/16  5:16 AM  Result Value Ref Range   Iron 22 (L) 45 - 182 ug/dL   TIBC 242 (L) 250 - 450 ug/dL   Saturation Ratios 9 (L) 17.9 - 39.5 %   UIBC 220 ug/dL  Ferritin     Status: None   Collection Time: 09/25/16  5:16 AM  Result Value Ref Range   Ferritin 257 24 - 336 ng/mL  Reticulocytes  Status: Abnormal   Collection Time: 09/25/16  5:16 AM  Result Value Ref Range   Retic Ct Pct 1.6 0.4 - 3.1 %   RBC. 3.31 (L) 4.22 - 5.81 MIL/uL   Retic Count, Manual 53.0 19.0 - 186.0 K/uL  Ammonia     Status: Abnormal   Collection Time: 09/25/16  5:16 AM  Result Value Ref Range   Ammonia 68 (H) 9 - 35 umol/L  Protime-INR     Status: Abnormal   Collection Time: 09/25/16   7:48 AM  Result Value Ref Range   Prothrombin Time 17.4 (H) 11.4 - 15.2 seconds   INR 1.41   MRSA PCR Screening     Status: None   Collection Time: 09/25/16  8:02 AM  Result Value Ref Range   MRSA by PCR NEGATIVE NEGATIVE    Comment:        The GeneXpert MRSA Assay (FDA approved for NASAL specimens only), is one component of a comprehensive MRSA colonization surveillance program. It is not intended to diagnose MRSA infection nor to guide or monitor treatment for MRSA infections.   Sodium, urine, random     Status: None   Collection Time: 09/25/16  8:02 AM  Result Value Ref Range   Sodium, Ur <10 mmol/L  Glucose, capillary     Status: Abnormal   Collection Time: 09/25/16  8:03 AM  Result Value Ref Range   Glucose-Capillary 106 (H) 65 - 99 mg/dL  Glucose, capillary     Status: Abnormal   Collection Time: 09/25/16 11:43 AM  Result Value Ref Range   Glucose-Capillary 101 (H) 65 - 99 mg/dL    Ct Abdomen Pelvis Wo Contrast  Result Date: 09/24/2016 CLINICAL DATA:  Central and abdominal pain, history of ventral hernia, abdominal distention, decreased appetite EXAM: CT ABDOMEN AND PELVIS WITHOUT CONTRAST TECHNIQUE: Multidetector CT imaging of the abdomen and pelvis was performed following the standard protocol without IV contrast. COMPARISON:  CT abdomen pelvis of 01/24/2016 FINDINGS: Lower chest: The lung bases are clear.  Mild cardiomegaly is stable. Hepatobiliary: The liver enhances with no focal abnormality. The contours the liver again are somewhat nodular suggesting cirrhosis. A small amount of perihepatic ascites is noted. The gallbladder appears decompressed. Pancreas: The pancreas is normal in size and the pancreatic duct is not dilated. Spleen: There is splenomegaly noted. There is a small amount of perisplenic ascites within the left upper quadrant. Varices are present within the upper abdomen. Adrenals/Urinary Tract: The adrenal glands appear normal. No renal calculi are seen  and no hydronephrosis is noted. A small cyst emanates from the or lower pole of the right kidney anteriorly and is unchanged. Ureters are normal in caliber. The urinary bladder is decompressed and difficult to assess. However the does appear to be a calcification within the urinary bladder which could represent a recently passed ureteral calculus of 3 mm in diameter. 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 The questioned calculus could represent some contrast that has entered through the fistulous communication into the urinary bladder rather than a calculus. Stomach/Bowel: The stomach is moderately distended with oral contrast food debris and air. No abnormality is seen. No small bowel distention is seen. There are rectosigmoid and descending colon diverticula present and there is fluid throughout the left colon in particular. Vascular/Lymphatic: The abdominal aorta is normal in caliber with moderate abdominal aortic atherosclerosis present. No adenopathy is seen.  Reproductive: The prostate is normal in size. Other: There is umbilical hernia present containing fat and ascites. No definite bowel is seen to enter this hernia sac. Also, fat enters the left inguinal canal. Musculoskeletal: There is sclerosis within the right femoral head without cortical disruption most consistent with avascular necrosis. The left femoral head is unremarkable. The lumbar vertebrae are in normal alignment with normal intervertebral disc spaces. IMPRESSION: 1. Umbilical hernia containing fat and ascites. 2. 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 3. Possible recently passed 3 mm ureteral calculus within urinary bladder versus contrast that has entered the urinary bladder from the sigmoid colon to urinary bladder fistula. Some air is noted in urinary bladder.  4. Perihepatic and perisplenic ascites. 5. Moderate abdominal aortic atherosclerosis. 6. 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 7. Rectosigmoid and descending colon diverticula Electronically Signed   By: Ivar Drape M.D.   On: 09/24/2016 15:05   Dg Chest Port 1 View  Result Date: 09/24/2016 CLINICAL DATA:  Hypotension EXAM: PORTABLE CHEST 1 VIEW COMPARISON:  None. FINDINGS: Heart size within normal limits. Negative for heart failure. Lungs are clear without infiltrate or effusion. 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 IMPRESSION: No active disease. Electronically Signed   By: Franchot Gallo M.D.   On: 09/24/2016 12:13    ROS Blood pressure (!) 76/43, pulse 73, temperature 98 F (36.7 C), temperature source Oral, resp. rate 18, height 5' 8"  (1.727 m), weight 112.8 kg (248 lb 10.9 oz), SpO2 99 %. Physical Exam Physical Examination: General appearance - depressed, pale, Mental status - alert, oriented to person, place, and time Eyes - pupils equal and reactive, extraocular eye movements intact, funduscopic exam normal, discs flat and sharp Mouth - mucous membranes moist, pharynx normal without lesions Neck - adenopathy noted PCL Lymphatics - posterior cervical nodes Chest - rales noted in bases, L chest wall AICD Heart - normal rate, regular rhythm, , rubs, clicks or gallops, S1 and S2 normal, systolic murmur MN8/1 at apex Abdomen - distended, pos FW, drain over umb, Umb distended Extremities - pedal edema 2 + Skin - pale, bruises  Assessment/Plan: 1 AKI low bp in setting of lisin, hemodynamic. Very concerned of HRS or trigger HRS.  This appears however primary hemodynamic. Need urine Culture  Need to expand vol, raise bps,  Suspect drainage and ? infx role in low bps. L ow U Na c/w HRS or vol decrease. 2 Cirrhosis 3 Hypertension: low now 4. Anemia check Fe 5. AICD 6 ^ lipids 7 ASN P vol expand, alb, mido, NS, get U/S  Anan Dapolito  L 09/25/2016, 4:41 PM

## 2016-09-25 NOTE — Progress Notes (Signed)
[facility] Progress Note     Subjective: Slight improvement in abdominal pain. Denies fever, chills, nausea, vomiting, or dizziness.   Objective: Vital signs in last 24 hours: Temp:  [97.3 F (36.3 C)-98 F (36.7 C)] 97.8 F (36.6 C) (12/01 0509) Pulse Rate:  [75-88] 76 (12/01 0509) Resp:  [13-23] 18 (12/01 0509) BP: (59-103)/(26-66) 96/50 (12/01 0515) SpO2:  [97 %-100 %] 100 % (12/01 0509) Weight:  [248 lb 10.9 oz (112.8 kg)] 248 lb 10.9 oz (112.8 kg) (11/30 2000) Last BM Date: 09/25/16  Intake/Output from previous day: No intake/output data recorded. Intake/Output this shift: No intake/output data recorded.  PE: Gen:  Alert, NAD, cooperative Abd: firm, distended, large umbilical hernia with overlying skin changes draining foul-smelling purulent ascitic fluid  Neuro: oriented, slow mentation  Lab Results:   Recent Labs  09/24/16 1132 09/25/16 0516  WBC 14.7* 14.1*  HGB 11.6* 10.5*  HCT 33.2* 30.6*  PLT 118* 128*   BMET  Recent Labs  09/24/16 1132 09/25/16 0516  NA 126* 130*  K 5.1 4.4  CL 91* 98*  CO2 22 18*  GLUCOSE 84 100*  BUN 72* 60*  CREATININE 3.42* 2.56*  CALCIUM 8.9 7.5*   PT/INR  Recent Labs  09/24/16 1132 09/25/16 0748  LABPROT 15.5* 17.4*  INR 1.22 1.41   CMP     Component Value Date/Time   NA 130 (L) 09/25/2016 0516   NA 137 06/15/2016 0816   K 4.4 09/25/2016 0516   CL 98 (L) 09/25/2016 0516   CO2 18 (L) 09/25/2016 0516   GLUCOSE 100 (H) 09/25/2016 0516   BUN 60 (H) 09/25/2016 0516   BUN 15 06/15/2016 0816   CREATININE 2.56 (H) 09/25/2016 0516   CALCIUM 7.5 (L) 09/25/2016 0516   PROT 6.6 09/25/2016 0516   PROT 7.3 06/15/2016 0816   ALBUMIN 1.8 (L) 09/25/2016 0516   ALBUMIN 3.7 06/15/2016 0816   AST 59 (H) 09/25/2016 0516   ALT 26 09/25/2016 0516   ALKPHOS 328 (H) 09/25/2016 0516   BILITOT 1.1 09/25/2016 0516   BILITOT 0.5 06/15/2016 0816   GFRNONAA 27 (L) 09/25/2016 0516   GFRAA 31 (L) 09/25/2016 0516    Lipase     Component Value Date/Time   LIPASE 44 09/24/2016 1206   Studies/Results: Ct Abdomen Pelvis Wo Contrast  Result Date: 09/24/2016 CLINICAL DATA:  Central and abdominal pain, history of ventral hernia, abdominal distention, decreased appetite EXAM: CT ABDOMEN AND PELVIS WITHOUT CONTRAST TECHNIQUE: Multidetector CT imaging of the abdomen and pelvis was performed following the standard protocol without IV contrast. COMPARISON:  CT abdomen pelvis of 01/24/2016 FINDINGS: Lower chest: The lung bases are clear.  Mild cardiomegaly is stable. Hepatobiliary: The liver enhances with no focal abnormality. The contours the liver again are somewhat nodular suggesting cirrhosis. A small amount of perihepatic ascites is noted. The gallbladder appears decompressed. Pancreas: The pancreas is normal in size and the pancreatic duct is not dilated. Spleen: There is splenomegaly noted. There is a small amount of perisplenic ascites within the left upper quadrant. Varices are present within the upper abdomen. Adrenals/Urinary Tract: The adrenal glands appear normal. No renal calculi are seen and no hydronephrosis is noted. A small cyst emanates from the or lower pole of the right kidney anteriorly and is unchanged. Ureters are normal in caliber. The urinary bladder is decompressed and difficult to assess. However the does appear to be a calcification within the urinary bladder which could represent a recently passed ureteral calculus  of 3 mm in diameter. 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 The questioned calculus could represent some contrast that has entered through the fistulous communication into the urinary bladder rather than a calculus. Stomach/Bowel: The stomach is moderately distended with oral contrast food debris and air. No abnormality is seen. No small bowel distention is seen. There are  rectosigmoid and descending colon diverticula present and there is fluid throughout the left colon in particular. Vascular/Lymphatic: The abdominal aorta is normal in caliber with moderate abdominal aortic atherosclerosis present. No adenopathy is seen. Reproductive: The prostate is normal in size. Other: There is umbilical hernia present containing fat and ascites. No definite bowel is seen to enter this hernia sac. Also, fat enters the left inguinal canal. Musculoskeletal: There is sclerosis within the right femoral head without cortical disruption most consistent with avascular necrosis. The left femoral head is unremarkable. The lumbar vertebrae are in normal alignment with normal intervertebral disc spaces. IMPRESSION: 1. Umbilical hernia containing fat and ascites. 2. 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 3. Possible recently passed 3 mm ureteral calculus within urinary bladder versus contrast that has entered the urinary bladder from the sigmoid colon to urinary bladder fistula. Some air is noted in urinary bladder. 4. Perihepatic and perisplenic ascites. 5. Moderate abdominal aortic atherosclerosis. 6. 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 7. Rectosigmoid and descending colon diverticula Electronically Signed   By: Dwyane DeePaul  Barry M.D.   On: 09/24/2016 15:05   Dg Chest Port 1 View  Result Date: 09/24/2016 CLINICAL DATA:  Hypotension EXAM: PORTABLE CHEST 1 VIEW COMPARISON:  None. FINDINGS: Heart size within normal limits. Negative for heart failure. Lungs are clear without infiltrate or effusion. 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 IMPRESSION: No active disease. Electronically Signed   By: Marlan Palauharles  Clark M.D.   On: 09/24/2016 12:13    Anti-infectives: Anti-infectives    Start     Dose/Rate Route Frequency Ordered Stop   09/26/16 2300  vancomycin (VANCOCIN) 1,500 mg in sodium chloride  0.9 % 500 mL IVPB     1,500 mg 250 mL/hr over 120 Minutes Intravenous Every 48 hours 09/24/16 2220     09/24/16 2300  piperacillin-tazobactam (ZOSYN) IVPB 3.375 g     3.375 g 12.5 mL/hr over 240 Minutes Intravenous Every 8 hours 09/24/16 2220     09/24/16 2300  vancomycin (VANCOCIN) 2,000 mg in sodium chloride 0.9 % 500 mL IVPB     2,000 mg 250 mL/hr over 120 Minutes Intravenous  Once 09/24/16 2220 09/25/16 0338     Assessment/Plan  Ventral hernia with ascitic leak from umbilicus in the setting of alcoholic liver disease  Continue IV abx - WBC 14.1  IR consult for paracentesis to relieve ascites  GI consult for further management/recommendations regarding liver disease - consider transfer to a liver center where a specialist/team can properly address perioperative complications.  Plan: GI/IR consult. patient will require repair of umbilical hernia but needs his liver disease and related complicationa addressed first. High operative risk. Will discuss further with MD.    LOS: 1 day    Adam PhenixElizabeth S Rmani Kellogg , [facility] 09/25/2016, 10:33 AM Pager: [phone] Consults: [phone] Mon-Fri 7:00 am-4:30 pm Sat-Sun 7:00 am-11:30 am

## 2016-09-25 NOTE — Progress Notes (Signed)
Pharmacy Antibiotic Note  Russell Trujillo is a 55 y.o. male admitted on 09/24/2016 with wound infection.  Pharmacy has been consulted for Vancomycin and Zosyn dosing.  Pt with pre-existing ventral hernia and vesicorectal fistula who has been experiencing periumbilial pain and drainage of fluid from hernia x 2 wks, to start antibiotics.   SCr is elevated, but improved from admission at 2.56- baseline ~1.1. nCrCl ~235ml/min.  Plan: Zosyn 3.375g IV q8h extended interval dosing Vancomycin 1250mg  IV q24h with improved renal function Watch renal fxn, c/s Vancomycin trough when appropriate  Height: 5\' 8"  (172.7 cm) Weight: 248 lb 10.9 oz (112.8 kg) IBW/kg (Calculated) : 68.4  Temp (24hrs), Avg:97.9 F (36.6 C), Min:97.8 F (36.6 C), Max:98 F (36.7 C)   Recent Labs Lab 09/24/16 1132 09/24/16 1145 09/24/16 1452 09/25/16 0516  WBC 14.7*  --   --  14.1*  CREATININE 3.42*  --   --  2.56*  LATICACIDVEN  --  1.71 1.65  --     Estimated Creatinine Clearance: 39.8 mL/min (by C-G formula based on SCr of 2.56 mg/dL (H)).    Allergies  Allergen Reactions  . Coreg [Carvedilol] Swelling    Swelling of lips    Antimicrobials this admission: Vanc 11/30>> Zosyn11/30 >>  Dose adjustments this admission: N/a  Microbiology results: 11/30BCx: ngtd 11/30UCx: sent 11/30MRSA PCR: neg  Thank you for allowing pharmacy to be a part of this patient's care.  Sheila Gervasi D. Kashawn Manzano, PharmD, BCPS Clinical Pharmacist Pager: [phone] 09/25/2016 11:10 AM

## 2016-09-26 ENCOUNTER — Inpatient Hospital Stay (HOSPITAL_COMMUNITY): Payer: Medicaid Other

## 2016-09-26 DIAGNOSIS — B9620 Unspecified Escherichia coli [E. coli] as the cause of diseases classified elsewhere: Secondary | ICD-10-CM

## 2016-09-26 DIAGNOSIS — I349 Nonrheumatic mitral valve disorder, unspecified: Secondary | ICD-10-CM

## 2016-09-26 DIAGNOSIS — N390 Urinary tract infection, site not specified: Secondary | ICD-10-CM

## 2016-09-26 LAB — CBC WITH DIFFERENTIAL/PLATELET
BASOS ABS: 0 10*3/uL (ref 0.0–0.1)
Basophils Relative: 0 %
EOS ABS: 0.1 10*3/uL (ref 0.0–0.7)
EOS PCT: 1 %
HCT: 30.3 % — ABNORMAL LOW (ref 39.0–52.0)
Hemoglobin: 10.4 g/dL — ABNORMAL LOW (ref 13.0–17.0)
Lymphocytes Relative: 6 %
Lymphs Abs: 0.6 10*3/uL — ABNORMAL LOW (ref 0.7–4.0)
MCH: 32.4 pg (ref 26.0–34.0)
MCHC: 34.3 g/dL (ref 30.0–36.0)
MCV: 94.4 fL (ref 78.0–100.0)
MONO ABS: 0.8 10*3/uL (ref 0.1–1.0)
Monocytes Relative: 8 %
Neutro Abs: 9 10*3/uL — ABNORMAL HIGH (ref 1.7–7.7)
Neutrophils Relative %: 86 %
PLATELETS: 112 10*3/uL — AB (ref 150–400)
RBC: 3.21 MIL/uL — AB (ref 4.22–5.81)
RDW: 15.1 % (ref 11.5–15.5)
WBC: 10.5 10*3/uL (ref 4.0–10.5)

## 2016-09-26 LAB — ECHOCARDIOGRAM COMPLETE
CHL CUP MV DEC (S): 204
CHL CUP RV SYS PRESS: 14 mmHg
E decel time: 204 msec
EERAT: 8.1
FS: 36 % (ref 28–44)
Height: 68 in
IV/PV OW: 0.6
LA diam end sys: 37 mm
LA vol A4C: 87.3 ml
LA vol index: 37.8 mL/m2
LA vol: 85.5 mL
LADIAMINDEX: 1.64 cm/m2
LASIZE: 37 mm
LDCA: 4.15 cm2
LV E/e' medial: 8.1
LV E/e'average: 8.1
LV PW d: 12.2 mm — AB (ref 0.6–1.1)
LV TDI E'LATERAL: 10.3
LV TDI E'MEDIAL: 8.67
LV e' LATERAL: 10.3 cm/s
LVOTD: 23 mm
MV pk A vel: 76.2 m/s
MV pk E vel: 83.4 m/s
MVPG: 3 mmHg
RV LATERAL S' VELOCITY: 19 cm/s
RV TAPSE: 24.9 mm
Reg peak vel: 166 cm/s
TR max vel: 166 cm/s
Weight: 4041.6 oz

## 2016-09-26 LAB — GLUCOSE, CAPILLARY
GLUCOSE-CAPILLARY: 104 mg/dL — AB (ref 65–99)
Glucose-Capillary: 91 mg/dL (ref 65–99)
Glucose-Capillary: 93 mg/dL (ref 65–99)
Glucose-Capillary: 90 mg/dL (ref 65–99)

## 2016-09-26 LAB — RENAL FUNCTION PANEL
ALBUMIN: 2.3 g/dL — AB (ref 3.5–5.0)
Anion gap: 12 (ref 5–15)
BUN: 43 mg/dL — AB (ref 6–20)
CHLORIDE: 106 mmol/L (ref 101–111)
CO2: 17 mmol/L — ABNORMAL LOW (ref 22–32)
CREATININE: 1.99 mg/dL — AB (ref 0.61–1.24)
Calcium: 6.8 mg/dL — ABNORMAL LOW (ref 8.9–10.3)
GFR calc Af Amer: 42 mL/min — ABNORMAL LOW (ref >60)
GFR, EST NON AFRICAN AMERICAN: 36 mL/min — AB (ref >60)
GLUCOSE: 86 mg/dL (ref 65–99)
Phosphorus: 3 mg/dL (ref 2.5–4.6)
Potassium: 3.9 mmol/L (ref 3.5–5.1)
Sodium: 135 mmol/L (ref 135–145)

## 2016-09-26 LAB — OSMOLALITY, URINE: Osmolality, Ur: 465 mOsm/kg (ref 300–900)

## 2016-09-26 LAB — TROPONIN I: TROPONIN I: 0.04 ng/mL — AB (ref <0.03)

## 2016-09-26 MED ORDER — TRAMADOL HCL 50 MG PO TABS
ORAL_TABLET | ORAL | Status: AC
  Filled 2016-09-26: qty 1

## 2016-09-26 MED ORDER — TRAMADOL HCL 50 MG PO TABS
50.0000 mg | ORAL_TABLET | Freq: Four times a day (QID) | ORAL | Status: DC | PRN
Start: 2016-09-26 — End: 2016-09-29
  Administered 2016-09-26 (×2): 50 mg via ORAL

## 2016-09-26 NOTE — Progress Notes (Signed)
Subjective:  Interval History: has no complaint . Feels a little better.  Objective: Vital signs in last 24 hours: Temp:  [97.6 F (36.4 C)-98.6 F (37 C)] 98.3 F (36.8 C) (12/02 0558) Pulse Rate:  [73-86] 79 (12/02 0558) Resp:  [16-20] 20 (12/02 0558) BP: (76-117)/(43-51) 96/51 (12/02 0558) SpO2:  [98 %-100 %] 100 % (12/02 0558) Weight:  [114.6 kg (252 lb 9.6 oz)] 114.6 kg (252 lb 9.6 oz) (12/01 2104) Weight change: 1.779 kg (3 lb 14.7 oz)  Intake/Output from previous day: 12/01 0701 - 12/02 0700 In: 5860.8 [P.O.:1320; I.V.:3890.8; IV Piggyback:650] Out: 450 [Urine:450] Intake/Output this shift: No intake/output data recorded.  General appearance: alert, cooperative, no distress and pale Resp: diminished breath sounds bibasilar and rales bibasilar Cardio: S1, S2 normal and systolic murmur: holosystolic 2/6, blowing at apex GI: pos bs, soft, liver down 6 cm, pos FW, umb swelling and drain over Extremities: edema 2+  Lab Results:  Recent Labs  09/25/16 0516 09/26/16 0505  WBC 14.1* 10.5  HGB 10.5* 10.4*  HCT 30.6* 30.3*  PLT 128* 112*   BMET:  Recent Labs  09/25/16 0516 09/26/16 0505  NA 130* 135  K 4.4 3.9  CL 98* 106  CO2 18* 17*  GLUCOSE 100* 86  BUN 60* 43*  CREATININE 2.56* 1.99*  CALCIUM 7.5* 6.8*   No results for input(s): PTH in the last 72 hours. Iron Studies:  Recent Labs  09/25/16 0516  IRON 22*  TIBC 242*  FERRITIN 257    Studies/Results: Ct Abdomen Pelvis Wo Contrast  Result Date: 09/24/2016 CLINICAL DATA:  Central and abdominal pain, history of ventral hernia, abdominal distention, decreased appetite EXAM: CT ABDOMEN AND PELVIS WITHOUT CONTRAST TECHNIQUE: Multidetector CT imaging of the abdomen and pelvis was performed following the standard protocol without IV contrast. COMPARISON:  CT abdomen pelvis of 01/24/2016 FINDINGS: Lower chest: The lung bases are clear.  Mild cardiomegaly is stable. Hepatobiliary: The liver enhances with no  focal abnormality. The contours the liver again are somewhat nodular suggesting cirrhosis. A small amount of perihepatic ascites is noted. The gallbladder appears decompressed. Pancreas: The pancreas is normal in size and the pancreatic duct is not dilated. Spleen: There is splenomegaly noted. There is a small amount of perisplenic ascites within the left upper quadrant. Varices are present within the upper abdomen. Adrenals/Urinary Tract: The adrenal glands appear normal. No renal calculi are seen and no hydronephrosis is noted. A small cyst emanates from the or lower pole of the right kidney anteriorly and is unchanged. Ureters are normal in caliber. The urinary bladder is decompressed and difficult to assess. However the does appear to be a calcification within the urinary bladder which could represent a recently passed ureteral calculus of 3 mm in diameter. 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 The questioned calculus could represent some contrast that has entered through the fistulous communication into the urinary bladder rather than a calculus. Stomach/Bowel: The stomach is moderately distended with oral contrast food debris and air. No abnormality is seen. No small bowel distention is seen. There are rectosigmoid and descending colon diverticula present and there is fluid throughout the left colon in particular. Vascular/Lymphatic: The abdominal aorta is normal in caliber with moderate abdominal aortic atherosclerosis present. No adenopathy is seen. Reproductive: The prostate is normal in size. Other: There is umbilical hernia present containing fat and ascites. No definite bowel is  seen to enter this hernia sac. Also, fat enters the left inguinal canal. Musculoskeletal: There is sclerosis within the right femoral head without cortical disruption most consistent with avascular necrosis. The left  femoral head is unremarkable. The lumbar vertebrae are in normal alignment with normal intervertebral disc spaces. IMPRESSION: 1. Umbilical hernia containing fat and ascites. 2. 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 3. Possible recently passed 3 mm ureteral calculus within urinary bladder versus contrast that has entered the urinary bladder from the sigmoid colon to urinary bladder fistula. Some air is noted in urinary bladder. 4. Perihepatic and perisplenic ascites. 5. Moderate abdominal aortic atherosclerosis. 6. 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 7. Rectosigmoid and descending colon diverticula Electronically Signed   By: Dwyane DeePaul  Barry M.D.   On: 09/24/2016 15:05   [place] Renal  Result Date: 09/25/2016 CLINICAL DATA:  Acute renal insufficiency EXAM: RENAL / URINARY TRACT ULTRASOUND COMPLETE COMPARISON:  CT scan September 24, 2016 FINDINGS: Right Kidney: Length: 12.8 cm.  1.9 cm cyst in the right kidney. Left Kidney: Length: 12.3 cm. Echogenicity within normal limits. No mass or hydronephrosis visualized. Bladder: Appears normal for degree of bladder distention. Complex ascites seen in the right lower quadrant, greater than the left lower quadrant. IMPRESSION: 1. Right renal cysts.  No acute renal abnormality. 2. Complex ascites, right greater than left lower quadrants. Electronically Signed   By: Gerome Samavid  Williams III M.D   On: 09/25/2016 20:55   Dg Chest Port 1 View  Result Date: 09/24/2016 CLINICAL DATA:  Hypotension EXAM: PORTABLE CHEST 1 VIEW COMPARISON:  None. FINDINGS: Heart size within normal limits. Negative for heart failure. Lungs are clear without infiltrate or effusion. 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 IMPRESSION: No active disease. Electronically Signed   By: Marlan Palauharles  Clark M.D.   On: 09/24/2016 12:13    I have reviewed the patient's current  medications.  Assessment/Plan: 1 AKI responding to tx, with alb, vol, mido.  Urine vol ???.  Still acidemic , on bicarb 2 Cirrhosis 3 Umb rupture 4 ETOH 5 HTN never use ACR/ARB 6 DM P cont current tx,     LOS: 2 days   Janazia Schreier L 09/26/2016,9:18 AM

## 2016-09-26 NOTE — Progress Notes (Signed)
  Echocardiogram 2D Echocardiogram has been performed.  Russell RochENNINGTON, Russell Trujillo 09/26/2016, 9:36 AM

## 2016-09-26 NOTE — Progress Notes (Signed)
At shift assessment, patient complaining of lower back and lower abdominal pain.  He rates it as aching in nature and a 6/10 on the pain scale.  No pain medications ordered.  MD made aware.  Per order, 650 mg Tylenol PO given at 2138.  Upon re-assessment, pain level is 4/10.  Will continue to monitor patient.  [employer] & MinorKimberly Captain Blucher RN-BC, WTA.

## 2016-09-26 NOTE — Progress Notes (Signed)
PROGRESS NOTE [facility]   Lizbeth BarkMarty Czaja   ZOX:[id number] DOB: Jul 07, 1961  DOA: 09/24/2016 PCP: Elige RadonJoshua A Dettinger, MD   Brief Narrative:  55 year old male with history of AICD implant, CHF, a vascular necrosis of the right hip, vesicorectal fistula, peritoneal hernia, heart failure, GERD, Chronic liver diseases and obesity. Sent to the emergency department at [facility] due to increasing abdominal pain and drainage from his umbilical hernia that this has been occurring for the past 2 weeks. [facility] for multidisciplinary approach surgical, nephrology and gastroenterology evaluation. Being managed for drainage from ventral hernia with antibiotics. Patient was found to have large amounts of ascites.  Patient have been noncompliant therapy he was supposed to have EGD to screen for diuresis but never was done also not taking diuretics as prescribed. Was supposed to see surgeon at [facility] colovesical fistula with never follow.  Subjective: Feeling much better today. Hernia continues to drain. Paracentesis was cancel by renal due to concern of hepatorenal sx. Cr improving with IVF. 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  Patient need paracentesis for now - to r/o SBP - will difer to GI if can do only diagnostic tap rather than removing to much fluid given risk for HRS  GI recommendations appreciated  Patient on empiric abx    Follow GI   AKI - concern about hepatorenal syndrome, low urine Na+  - improving on bicarb, IVF and midodrine  Continue IVF Continue management per nephrology  Strict I&O's Follow nephrology     Hypertension - now hypotensive, ? 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, no changes on images  Pain control PRN   Tramadol added   DVT prophylaxis: Lovenox Code Status: Full Family Communication: None at bedside Disposition Plan: Poor prognosis, anticipate patient to admitted for several days  Consultants:   GI  Nephro  Cardio  Gen Surgery  Procedures:   For Paracentesis   Antimicrobials:  Zosyn 11/30  Vanc 11/30   Objective: Vitals:   09/25/16 1830 09/25/16 2104 09/26/16 0558 09/26/16 1022  BP: (!) 94/44 (!) 117/43 (!) 96/51 (!) 98/52  Pulse: 84 85 79 80  Resp: 16 19 20 20   Temp: 97.6 F (36.4 C) 98.6 F (37 C) 98.3 F (36.8 C) 97.6 F (36.4 C)  TempSrc: Oral Oral Oral Oral  SpO2: 100% 98% 100% 100%  Weight:  114.6 kg (252 lb 9.6 oz)    Height:        Intake/Output Summary (Last 24 hours) at 09/26/16 1711 Last data filed at 09/26/16 1634  Gross per 24 hour  Intake             4850 ml  Output              540 ml  Net  4310 ml   Filed Weights   09/24/16 2000 09/25/16 2104  Weight: 112.8 kg (248 lb 10.9 oz) 114.6 kg (252 lb 9.6 oz)    Examination:  General exam: Appears calm and comfortable  Respiratory system: Clear to auscultation. No wheezes,crackle or rhonchi Cardiovascular system: S1 & S2 heard, RRR. No JVD, murmurs, rubs or gallops Gastrointestinal system: Obese, Abd soft, large umbilical hernia with purulent drainage, mal odor, + ascites, +BS Mild tenderness to palpation, abdomen less distended today  Central nervous system: Alert and oriented.  Extremities: No pedal edema.  Skin: Multiple  ecchymotic lesions      Data Reviewed: I have personally reviewed following labs and imaging studies  CBC:  Recent Labs Lab 09/24/16 1132 09/25/16 0516 09/26/16 0505  WBC 14.7* 14.1* 10.5  NEUTROABS 12.8* 12.2* 9.0*  HGB 11.6* 10.5* 10.4*  HCT 33.2* 30.6* 30.3*  MCV 94.9 92.4 94.4  PLT 118* 128* 112*   Basic Metabolic Panel:  Recent Labs Lab 09/24/16 1132 09/24/16 1138 09/25/16 0516 09/26/16 0505  NA 126*  --  130* 135  K 5.1  --  4.4 3.9  CL 91*  --  98* 106  CO2 22  --  18* 17*  GLUCOSE 84  --  100* 86  BUN 72*  --  60* 43*  CREATININE 3.42*  --  2.56* 1.99*  CALCIUM 8.9  --  7.5* 6.8*  MG  --  2.2  --   --   PHOS  --   --   --  3.0   GFR: Estimated Creatinine Clearance: 51.6 mL/min (by C-G formula based on SCr of 1.99 mg/dL (H)). Liver Function Tests:  Recent Labs Lab 09/24/16 1132 09/25/16 0516 09/26/16 0505  AST 54* 59*  --   ALT 24 26  --   ALKPHOS 303* 328*  --   BILITOT 0.8 1.1  --   PROT 7.2 6.6  --   ALBUMIN 2.2* 1.8* 2.3*    Recent Labs Lab 09/24/16 1206  LIPASE 44    Recent Labs Lab 09/25/16 0516  AMMONIA 68*   Coagulation Profile:  Recent Labs Lab 09/24/16 1132 09/25/16 0748  INR 1.22 1.41   Cardiac Enzymes:  Recent Labs Lab 09/24/16 2225 09/26/16 0505  TROPONINI <0.03 0.04*   BNP (last 3 results) No results for input(s): PROBNP in the last 8760 hours. HbA1C: No results for input(s): HGBA1C in the last 72 hours. CBG:  Recent Labs Lab 09/25/16 1726 09/25/16 2102 09/26/16 0746 09/26/16 1138 09/26/16 1618  GLUCAP 105* 100* 91 104* 93   Lipid Profile: No results for input(s): CHOL, HDL, LDLCALC, TRIG, CHOLHDL, LDLDIRECT in the last 72 hours. Thyroid Function Tests: No results for input(s): TSH, T4TOTAL, FREET4, T3FREE, THYROIDAB in the last 72 hours. Anemia Panel:  Recent Labs  09/25/16 0516  VITAMINB12 2,160*  FOLATE 16.3  FERRITIN 257  TIBC 242*  IRON 22*  RETICCTPCT 1.6   Sepsis  Labs:  Recent Labs Lab 09/24/16 1145 09/24/16 1452  LATICACIDVEN 1.71 1.65    Recent Results (from the past 240 hour(s))  Urine culture     Status: Abnormal (Preliminary result)   Collection Time: 09/24/16 11:28 AM  Result Value Ref Range Status   Specimen Description URINE, CLEAN CATCH  Final   Special Requests NONE  Final   Culture >=100,000 COLONIES/mL ESCHERICHIA COLI (A)  Final   Report Status PENDING  Incomplete  Culture, blood (Routine x 2)     Status: None (Preliminary result)  Collection Time: 09/24/16 11:32 AM  Result Value Ref Range Status   Specimen Description BLOOD LEFT ANTECUBITAL DRAWN BY RN  Final   Special Requests   Final    BOTTLES DRAWN AEROBIC AND ANAEROBIC AEB=6CC ANA=8CC   Culture NO GROWTH < 24 HOURS  Final   Report Status PENDING  Incomplete  Culture, blood (Routine x 2)     Status: None (Preliminary result)   Collection Time: 09/24/16 11:38 AM  Result Value Ref Range Status   Specimen Description BLOOD LEFT ANTECUBITAL DRAWN BY RN  Final   Special Requests BOTTLES DRAWN AEROBIC AND ANAEROBIC 6CC   Final   Culture NO GROWTH < 24 HOURS  Final   Report Status PENDING  Incomplete  MRSA PCR Screening     Status: None   Collection Time: 09/25/16  8:02 AM  Result Value Ref Range Status   MRSA by PCR NEGATIVE NEGATIVE Final    Comment:        The GeneXpert MRSA Assay (FDA approved for NASAL specimens only), is one component of a comprehensive MRSA colonization surveillance program. It is not intended to diagnose MRSA infection nor to guide or monitor treatment for MRSA infections.   Aerobic Culture (superficial specimen)     Status: None (Preliminary result)   Collection Time: 09/26/16 11:21 AM  Result Value Ref Range Status   Specimen Description WOUND  Final   Special Requests HERNIA SAC VENTRAL  Final   Gram Stain   Final    DEGENERATED CELLULAR MATERIAL PRESENT FEW GRAM NEGATIVE COCCOBACILLI FEW GRAM POSITIVE COCCI IN PAIRS RARE GRAM  POSITIVE RODS    Culture PENDING  Incomplete   Report Status PENDING  Incomplete     Radiology Studies: [place] Renal  Result Date: 09/25/2016 CLINICAL DATA:  Acute renal insufficiency EXAM: RENAL / URINARY TRACT ULTRASOUND COMPLETE COMPARISON:  CT scan September 24, 2016 FINDINGS: Right Kidney: Length: 12.8 cm.  1.9 cm cyst in the right kidney. Left Kidney: Length: 12.3 cm. Echogenicity within normal limits. No mass or hydronephrosis visualized. Bladder: Appears normal for degree of bladder distention. Complex ascites seen in the right lower quadrant, greater than the left lower quadrant. IMPRESSION: 1. Right renal cysts.  No acute renal abnormality. 2. Complex ascites, right greater than left lower quadrants. Electronically Signed   By: Gerome Samavid  Williams III M.D   On: 09/25/2016 20:55    Scheduled Meds: . albumin human  25 g Intravenous TID  . aspirin  325 mg Oral Daily  . enoxaparin (LOVENOX) injection  40 mg Subcutaneous Q24H  . fenofibrate  160 mg Oral Daily  . FLUoxetine  10 mg Oral Daily  . insulin aspart  0-9 Units Subcutaneous TID WC  . midodrine  10 mg Oral TID WC  . piperacillin-tazobactam (ZOSYN)  IV  3.375 g Intravenous Q8H  . sodium bicarbonate  1,300 mg Oral BID  . vancomycin  1,250 mg Intravenous Q24H   Continuous Infusions: . sodium chloride 150 mL/hr at 09/26/16 1055     LOS: 2 days    Latrelle DodrillEdwin Silva, MD [facility] Pager [phone]  If 7PM-7AM, please contact night-coverage [email or web address] Password [email or web address] 09/26/2016, 5:11 PM

## 2016-09-26 NOTE — Progress Notes (Signed)
Patient ID: Russell BarkMarty Carrara, male   DOB: 1961-09-04, 55 y.o.   MRN: [id number]  [facility] Progress Note     Subjective: Feeling a little better today. Slept well last night.  WBC WNL today  Objective: Vital signs in last 24 hours: Temp:  [97.6 F (36.4 C)-98.6 F (37 C)] 98.3 F (36.8 C) (12/02 0558) Pulse Rate:  [73-86] 79 (12/02 0558) Resp:  [16-20] 20 (12/02 0558) BP: (76-117)/(43-51) 96/51 (12/02 0558) SpO2:  [98 %-100 %] 100 % (12/02 0558) Weight:  [252 lb 9.6 oz (114.6 kg)] 252 lb 9.6 oz (114.6 kg) (12/01 2104) Last BM Date: 09/25/16  Intake/Output from previous day: 12/01 0701 - 12/02 0700 In: 5860.8 [P.O.:1320; I.V.:3890.8; IV Piggyback:650] Out: 450 [Urine:450] Intake/Output this shift: No intake/output data recorded.  PE: Gen:  Alert, NAD, pleasant Abd: firm, distended, large umbilical hernia with overlying skin changes draining foul-smelling purulent ascitic fluid  Neuro: oriented, slow mentation  Lab Results:   Recent Labs  09/25/16 0516 09/26/16 0505  WBC 14.1* 10.5  HGB 10.5* 10.4*  HCT 30.6* 30.3*  PLT 128* 112*   BMET  Recent Labs  09/25/16 0516 09/26/16 0505  NA 130* 135  K 4.4 3.9  CL 98* 106  CO2 18* 17*  GLUCOSE 100* 86  BUN 60* 43*  CREATININE 2.56* 1.99*  CALCIUM 7.5* 6.8*   PT/INR  Recent Labs  09/24/16 1132 09/25/16 0748  LABPROT 15.5* 17.4*  INR 1.22 1.41   CMP     Component Value Date/Time   NA 135 09/26/2016 0505   NA 137 06/15/2016 0816   K 3.9 09/26/2016 0505   CL 106 09/26/2016 0505   CO2 17 (L) 09/26/2016 0505   GLUCOSE 86 09/26/2016 0505   BUN 43 (H) 09/26/2016 0505   BUN 15 06/15/2016 0816   CREATININE 1.99 (H) 09/26/2016 0505   CALCIUM 6.8 (L) 09/26/2016 0505   PROT 6.6 09/25/2016 0516   PROT 7.3 06/15/2016 0816   ALBUMIN 2.3 (L) 09/26/2016 0505   ALBUMIN 3.7 06/15/2016 0816   AST 59 (H) 09/25/2016 0516   ALT 26 09/25/2016 0516   ALKPHOS 328 (H) 09/25/2016 0516   BILITOT 1.1  09/25/2016 0516   BILITOT 0.5 06/15/2016 0816   GFRNONAA 36 (L) 09/26/2016 0505   GFRAA 42 (L) 09/26/2016 0505   Lipase     Component Value Date/Time   LIPASE 44 09/24/2016 1206       Studies/Results: Ct Abdomen Pelvis Wo Contrast  Result Date: 09/24/2016 CLINICAL DATA:  Central and abdominal pain, history of ventral hernia, abdominal distention, decreased appetite EXAM: CT ABDOMEN AND PELVIS WITHOUT CONTRAST TECHNIQUE: Multidetector CT imaging of the abdomen and pelvis was performed following the standard protocol without IV contrast. COMPARISON:  CT abdomen pelvis of 01/24/2016 FINDINGS: Lower chest: The lung bases are clear.  Mild cardiomegaly is stable. Hepatobiliary: The liver enhances with no focal abnormality. The contours the liver again are somewhat nodular suggesting cirrhosis. A small amount of perihepatic ascites is noted. The gallbladder appears decompressed. Pancreas: The pancreas is normal in size and the pancreatic duct is not dilated. Spleen: There is splenomegaly noted. There is a small amount of perisplenic ascites within the left upper quadrant. Varices are present within the upper abdomen. Adrenals/Urinary Tract: The adrenal glands appear normal. No renal calculi are seen and no hydronephrosis is noted. A small cyst emanates from the or lower pole of the right kidney anteriorly and is unchanged. Ureters are normal in caliber. The urinary  bladder is decompressed and difficult to assess. However the does appear to be a calcification within the urinary bladder which could represent a recently passed ureteral calculus of 3 mm in diameter. 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 The questioned calculus could represent some contrast that has entered through the fistulous communication into the urinary bladder rather than a calculus. Stomach/Bowel: The stomach is moderately  distended with oral contrast food debris and air. No abnormality is seen. No small bowel distention is seen. There are rectosigmoid and descending colon diverticula present and there is fluid throughout the left colon in particular. Vascular/Lymphatic: The abdominal aorta is normal in caliber with moderate abdominal aortic atherosclerosis present. No adenopathy is seen. Reproductive: The prostate is normal in size. Other: There is umbilical hernia present containing fat and ascites. No definite bowel is seen to enter this hernia sac. Also, fat enters the left inguinal canal. Musculoskeletal: There is sclerosis within the right femoral head without cortical disruption most consistent with avascular necrosis. The left femoral head is unremarkable. The lumbar vertebrae are in normal alignment with normal intervertebral disc spaces. IMPRESSION: 1. Umbilical hernia containing fat and ascites. 2. 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 3. Possible recently passed 3 mm ureteral calculus within urinary bladder versus contrast that has entered the urinary bladder from the sigmoid colon to urinary bladder fistula. Some air is noted in urinary bladder. 4. Perihepatic and perisplenic ascites. 5. Moderate abdominal aortic atherosclerosis. 6. 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 7. Rectosigmoid and descending colon diverticula Electronically Signed   By: Dwyane DeePaul  Barry M.D.   On: 09/24/2016 15:05   [place] Renal  Result Date: 09/25/2016 CLINICAL DATA:  Acute renal insufficiency EXAM: RENAL / URINARY TRACT ULTRASOUND COMPLETE COMPARISON:  CT scan September 24, 2016 FINDINGS: Right Kidney: Length: 12.8 cm.  1.9 cm cyst in the right kidney. Left Kidney: Length: 12.3 cm. Echogenicity within normal limits. No mass or hydronephrosis visualized. Bladder: Appears normal for degree of bladder distention. Complex ascites seen in the  right lower quadrant, greater than the left lower quadrant. IMPRESSION: 1. Right renal cysts.  No acute renal abnormality. 2. Complex ascites, right greater than left lower quadrants. Electronically Signed   By: Gerome Samavid  Williams III M.D   On: 09/25/2016 20:55   Dg Chest Port 1 View  Result Date: 09/24/2016 CLINICAL DATA:  Hypotension EXAM: PORTABLE CHEST 1 VIEW COMPARISON:  None. FINDINGS: Heart size within normal limits. Negative for heart failure. Lungs are clear without infiltrate or effusion. 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 IMPRESSION: No active disease. Electronically Signed   By: Marlan Palauharles  Clark M.D.   On: 09/24/2016 12:13    Anti-infectives: Anti-infectives    Start     Dose/Rate Route Frequency Ordered Stop   09/26/16 2300  vancomycin (VANCOCIN) 1,500 mg in sodium chloride 0.9 % 500 mL IVPB  Status:  Discontinued     1,500 mg 250 mL/hr over 120 Minutes Intravenous Every 48 hours 09/24/16 2220 09/25/16 1111   09/25/16 2300  vancomycin (VANCOCIN) 1,250 mg in sodium chloride 0.9 % 250 mL IVPB     1,250 mg 166.7 mL/hr over 90 Minutes Intravenous Every 24 hours 09/25/16 1111     09/24/16 2300  piperacillin-tazobactam (ZOSYN) IVPB 3.375 g     3.375 g 12.5 mL/hr over 240 Minutes Intravenous  Every 8 hours 09/24/16 2220     09/24/16 2300  vancomycin (VANCOCIN) 2,000 mg in sodium chloride 0.9 % 500 mL IVPB     2,000 mg 250 mL/hr over 120 Minutes Intravenous  Once 09/24/16 2220 09/25/16 0338       Assessment/Plan Ventral hernia with ascitic leak from umbilicus in the setting of alcoholic liver disease             WBC WNL today, afebrile             GI considering paracentesis to relieve ascites, although patient is high risk SBP  Plan: appreciate GI and renal recommendations. Per patient they are trying to maximize kidney function before considering paracentesis of ascites (cr is down to 1.99). Continue empiric antibiotics. Would still recommend transfer to center with  hepatology support. No surgical intervention at this time. Will discuss with MD and general surgery will likely sign off.   LOS: 2 days    Edson Snowball , [facility] 09/26/2016, 9:48 AM Pager: [phone] Consults: [phone] Mon-Fri 7:00 am-4:30 pm Sat-Sun 7:00 am-11:30 am

## 2016-09-27 LAB — RENAL FUNCTION PANEL
Albumin: 2.4 g/dL — ABNORMAL LOW (ref 3.5–5.0)
Anion gap: 10 (ref 5–15)
BUN: 21 mg/dL — ABNORMAL HIGH (ref 6–20)
CALCIUM: 6.1 mg/dL — AB (ref 8.9–10.3)
CO2: 18 mmol/L — AB (ref 22–32)
CREATININE: 1.37 mg/dL — AB (ref 0.61–1.24)
Chloride: 108 mmol/L (ref 101–111)
GFR, EST NON AFRICAN AMERICAN: 57 mL/min — AB (ref >60)
Glucose, Bld: 88 mg/dL (ref 65–99)
Phosphorus: 2.5 mg/dL (ref 2.5–4.6)
Potassium: 3.5 mmol/L (ref 3.5–5.1)
SODIUM: 136 mmol/L (ref 135–145)

## 2016-09-27 LAB — CBC WITH DIFFERENTIAL/PLATELET
BASOS ABS: 0 10*3/uL (ref 0.0–0.1)
BASOS PCT: 0 %
EOS ABS: 0.1 10*3/uL (ref 0.0–0.7)
EOS PCT: 1 %
HCT: 29.2 % — ABNORMAL LOW (ref 39.0–52.0)
Hemoglobin: 9.7 g/dL — ABNORMAL LOW (ref 13.0–17.0)
Lymphocytes Relative: 9 %
Lymphs Abs: 0.8 10*3/uL (ref 0.7–4.0)
MCH: 31.5 pg (ref 26.0–34.0)
MCHC: 33.2 g/dL (ref 30.0–36.0)
MCV: 94.8 fL (ref 78.0–100.0)
Monocytes Absolute: 0.9 10*3/uL (ref 0.1–1.0)
Monocytes Relative: 10 %
Neutro Abs: 7.6 10*3/uL (ref 1.7–7.7)
Neutrophils Relative %: 80 %
PLATELETS: 114 10*3/uL — AB (ref 150–400)
RBC: 3.08 MIL/uL — AB (ref 4.22–5.81)
RDW: 15.2 % (ref 11.5–15.5)
WBC: 9.5 10*3/uL (ref 4.0–10.5)

## 2016-09-27 LAB — GLUCOSE, CAPILLARY
GLUCOSE-CAPILLARY: 84 mg/dL (ref 65–99)
Glucose-Capillary: 91 mg/dL (ref 65–99)
Glucose-Capillary: 88 mg/dL (ref 65–99)
Glucose-Capillary: 83 mg/dL (ref 65–99)

## 2016-09-27 LAB — MAGNESIUM: MAGNESIUM: 1.6 mg/dL — AB (ref 1.7–2.4)

## 2016-09-27 MED ORDER — SODIUM CHLORIDE 0.9 % IV SOLN
1.0000 g | Freq: Once | INTRAVENOUS | Status: AC
  Administered 2016-09-27: 1 g via INTRAVENOUS
  Filled 2016-09-27: qty 10

## 2016-09-27 MED ORDER — MAGNESIUM SULFATE 2 GM/50ML IV SOLN
2.0000 g | Freq: Once | INTRAVENOUS | Status: AC
  Administered 2016-09-27: 2 g via INTRAVENOUS
  Filled 2016-09-27: qty 50

## 2016-09-27 MED ORDER — CALCIUM CARBONATE-VITAMIN D 500-200 MG-UNIT PO TABS
ORAL_TABLET | ORAL | Status: AC
  Administered 2016-09-27: 1
  Filled 2016-09-27: qty 3

## 2016-09-27 MED ORDER — CALCIUM CARBONATE 1250 (500 CA) MG PO TABS
1250.0000 mg | ORAL_TABLET | Freq: Two times a day (BID) | ORAL | Status: DC
  Administered 2016-09-27 – 2016-09-29 (×4): 1250 mg via ORAL
  Filled 2016-09-27 (×4): qty 1

## 2016-09-27 MED ORDER — CEFTRIAXONE SODIUM 1 G IJ SOLR
1.0000 g | INTRAMUSCULAR | Status: DC
  Administered 2016-09-27 – 2016-09-28 (×2): 1 g via INTRAVENOUS
  Filled 2016-09-27 (×3): qty 10

## 2016-09-27 NOTE — Progress Notes (Signed)
CRITICAL VALUE ALERT  Critical value received:  Ca++ 6.1  Date of notification:  09/27/16  Time of notification:    Critical value read back:  Nurse who received alert:  Roxanne MinsAlbert Rio  MD notified (1st page):  Dr. Edward JollySilva  Time of first page:  7:17  MD notified (2nd page):  Time of second page:  Responding MD:    Time MD responded:

## 2016-09-27 NOTE — Progress Notes (Signed)
Subjective:  Interval History: has no complaint, feels better.  Objective: Vital signs in last 24 hours: Temp:  [97.4 F (36.3 C)-98 F (36.7 C)] 97.4 F (36.3 C) (12/03 0908) Pulse Rate:  [77-84] 77 (12/03 0908) Resp:  [18-19] 18 (12/03 0908) BP: (101-110)/(55-61) 104/61 (12/03 0908) SpO2:  [100 %] 100 % (12/03 0908) Weight:  [113 kg (249 lb 3.2 oz)] 113 kg (249 lb 3.2 oz) (12/02 2134) Weight change: -1.542 kg (-3 lb 6.4 oz)  Intake/Output from previous day: 12/02 0701 - 12/03 0700 In: 5840 [P.O.:1540; I.V.:3600; IV Piggyback:700] Out: 540 [Urine:540] Intake/Output this shift: Total I/O In: 940 [P.O.:240; I.V.:600; IV Piggyback:100] Out: 150 [Urine:150]  General appearance: alert, cooperative, no distress and moderately obese Resp: diminished breath sounds bilaterally and rales bibasilar Cardio: S1, S2 normal and systolic murmur: holosystolic 2/6, blowing at lower left sternal border GI: pos FW, umb distended with drain over Extremities: edema 2+  Lab Results:  Recent Labs  09/26/16 0505 09/27/16 0535  WBC 10.5 9.5  HGB 10.4* 9.7*  HCT 30.3* 29.2*  PLT 112* 114*   BMET:  Recent Labs  09/26/16 0505 09/27/16 0535  NA 135 136  K 3.9 3.5  CL 106 108  CO2 17* 18*  GLUCOSE 86 88  BUN 43* 21*  CREATININE 1.99* 1.37*  CALCIUM 6.8* 6.1*   No results for input(s): PTH in the last 72 hours. Iron Studies:  Recent Labs  09/25/16 0516  IRON 22*  TIBC 242*  FERRITIN 257    Studies/Results: [place] Renal  Result Date: 09/25/2016 CLINICAL DATA:  Acute renal insufficiency EXAM: RENAL / URINARY TRACT ULTRASOUND COMPLETE COMPARISON:  CT scan September 24, 2016 FINDINGS: Right Kidney: Length: 12.8 cm.  1.9 cm cyst in the right kidney. Left Kidney: Length: 12.3 cm. Echogenicity within normal limits. No mass or hydronephrosis visualized. Bladder: Appears normal for degree of bladder distention. Complex ascites seen in the right lower quadrant, greater than the left lower  quadrant. IMPRESSION: 1. Right renal cysts.  No acute renal abnormality. 2. Complex ascites, right greater than left lower quadrants. Electronically Signed   By: Gerome Samavid  Williams III M.D   On: 09/25/2016 20:55    I have reviewed the patient's current medications.  Assessment/Plan: 1 AKI improving.  Etio hemodynamic with Lisin.  Nonoliguric 2 Cirrhosis now safe to do 1 Liter paracentesis, not LV 3 anemia 4 ETOH counseled 5 HTN not an issue 6 RV fistula  P paracentesis, stop alb, will stop mido soon,     LOS: 3 days   Dorissa Stinnette L 09/27/2016,11:02 AM

## 2016-09-27 NOTE — Progress Notes (Signed)
PROGRESS NOTE [facility]   Ab Leaming   JXB:[id number] DOB: 28-Feb-1961  DOA: 09/24/2016 PCP: Elige Radon Dettinger, MD   Brief Narrative:  55 year old male with history of AICD implant, CHF, a vascular necrosis of the right hip, vesicorectal fistula, peritoneal hernia, heart failure, GERD, Chronic liver diseases and obesity. Sent to the emergency department at [facility] due to increasing abdominal pain and drainage from his umbilical hernia that this has been occurring for the past 2 weeks. Patient was transferred to [facility] for multidisciplinary approach surgical, nephrology and gastroenterology evaluation. Being managed for drainage from ventral hernia with antibiotics. Patient was found to have large amounts of ascites.  Patient have been noncompliant therapy he was supposed to have EGD to screen for diuresis but never was done also not taking diuretics as prescribed. Was supposed to see surgeon at [facility] colovesical fistula with never follow.  Subjective: Patient seen and examined at bedside. No new complaints. Patient doing much better. Cr improving with IVF.  Assessment & Plan: AKI - Improving with hydration, low urine Na+  - improving on bicarb, IVF and midodrine  Continue IVF, Bicarb and mido Continue management per nephrology  Strict I&O's Follow nephrology   E Coli UTI Pansensitive - patient have a chronic colovesical fistula, no compliant with follow up  On Zosyn will switch to ceftriaxone  Ventral hernia /Colovesical fistula - chronic,  Surgery recommendations appreciated Will continue empiric abx  No surgical intervention at this time  Wound cx pending  Flood syndrome   Hypocalcemia - corrected Ca+ 7.4 Calcium gluconate given  Start Oscal   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  Paracentesis ordered remove only 1L - diagnostic labs  ordered  much fluid given risk for HRS  GI recommendations appreciated  Patient on empiric abx      Hypertension - now hypotensive, ? 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, no changes on images  Pain control PRN   Tramadol added   DVT prophylaxis: Lovenox Code Status: Full Family Communication: None at bedside Disposition Plan: Poor prognosis, anticipate patient to be admitted for several days  Consultants:   GI  Nephro  Cardio  Gen Surgery  Procedures:   For Paracentesis   Antimicrobials:  Zosyn 11/30  Vanc 11/30   Objective: Vitals:   09/26/16 1735 09/26/16 2134 09/27/16 0554 09/27/16 0908  BP: (!) 101/55 110/60 104/60 104/61  Pulse: 83 84 79 77  Resp: 19 19 18 18   Temp: 98 F (36.7 C) 97.8 F (36.6 C) 97.6 F (36.4 C) 97.4 F (36.3 C)  TempSrc: Oral Oral Oral Oral  SpO2: 100% 100% 100% 100%  Weight:  113 kg (249 lb 3.2 oz)    Height:        Intake/Output Summary (Last 24 hours) at 09/27/16 1516 Last data filed at 09/27/16 1500  Gross per 24 hour  Intake             5810 ml  Output  740 ml  Net             5070 ml   Filed Weights   09/24/16 2000 09/25/16 2104 09/26/16 2134  Weight: 112.8 kg (248 lb 10.9 oz) 114.6 kg (252 lb 9.6 oz) 113 kg (249 lb 3.2 oz)    Examination:  General exam: NAD  Respiratory system: CTA  Cardiovascular system: S1S2 rrr no murmurs  Gastrointestinal system: Large ventral hernia, draining purulent material, mal odor. Non tender  Central nervous system: Alert and oriented.  Extremities: No pedal edema.  Skin: Multiple ecchymotic lesions      Data Reviewed: I have  personally reviewed following labs and imaging studies  CBC:  Recent Labs Lab 09/24/16 1132 09/25/16 0516 09/26/16 0505 09/27/16 0535  WBC 14.7* 14.1* 10.5 9.5  NEUTROABS 12.8* 12.2* 9.0* 7.6  HGB 11.6* 10.5* 10.4* 9.7*  HCT 33.2* 30.6* 30.3* 29.2*  MCV 94.9 92.4 94.4 94.8  PLT 118* 128* 112* 114*   Basic Metabolic Panel:  Recent Labs Lab 09/24/16 1132 09/24/16 1138 09/25/16 0516 09/26/16 0505 09/27/16 0535  NA 126*  --  130* 135 136  K 5.1  --  4.4 3.9 3.5  CL 91*  --  98* 106 108  CO2 22  --  18* 17* 18*  GLUCOSE 84  --  100* 86 88  BUN 72*  --  60* 43* 21*  CREATININE 3.42*  --  2.56* 1.99* 1.37*  CALCIUM 8.9  --  7.5* 6.8* 6.1*  MG  --  2.2  --   --  1.6*  PHOS  --   --   --  3.0 2.5   GFR: Estimated Creatinine Clearance: 74.3 mL/min (by C-G formula based on SCr of 1.37 mg/dL (H)). Liver Function Tests:  Recent Labs Lab 09/24/16 1132 09/25/16 0516 09/26/16 0505 09/27/16 0535  AST 54* 59*  --   --   ALT 24 26  --   --   ALKPHOS 303* 328*  --   --   BILITOT 0.8 1.1  --   --   PROT 7.2 6.6  --   --   ALBUMIN 2.2* 1.8* 2.3* 2.4*    Recent Labs Lab 09/24/16 1206  LIPASE 44    Recent Labs Lab 09/25/16 0516  AMMONIA 68*   Coagulation Profile:  Recent Labs Lab 09/24/16 1132 09/25/16 0748  INR 1.22 1.41   Cardiac Enzymes:  Recent Labs Lab 09/24/16 2225 09/26/16 0505  TROPONINI <0.03 0.04*   BNP (last 3 results) No results for input(s): PROBNP in the last 8760 hours. HbA1C: No results for input(s): HGBA1C in the last 72 hours. CBG:  Recent Labs Lab 09/26/16 1138 09/26/16 1618 09/26/16 2133 09/27/16 0725 09/27/16 1136  GLUCAP 104* 93 90 84 91   Lipid Profile: No results for input(s): CHOL, HDL, LDLCALC, TRIG, CHOLHDL, LDLDIRECT in the last 72 hours. Thyroid Function Tests: No results for input(s): TSH, T4TOTAL, FREET4, T3FREE, THYROIDAB in the last 72 hours. Anemia Panel:  Recent Labs  09/25/16 0516  VITAMINB12 2,160*    FOLATE 16.3  FERRITIN 257  TIBC 242*  IRON 22*  RETICCTPCT 1.6   Sepsis Labs:  Recent Labs Lab 09/24/16 1145 09/24/16 1452  LATICACIDVEN 1.71 1.65    Recent Results (from the past 240 hour(s))  Urine culture     Status: Abnormal (Preliminary result)   Collection Time: 09/24/16 11:28 AM  Result Value Ref Range Status   Specimen Description URINE, CLEAN CATCH  Final  Special Requests NONE  Final   Culture >=100,000 COLONIES/mL ESCHERICHIA COLI (A)  Final   Report Status PENDING  Incomplete   Organism ID, Bacteria ESCHERICHIA COLI (A)  Final      Susceptibility   Escherichia coli - MIC*    AMPICILLIN 4 SENSITIVE Sensitive     CEFAZOLIN <=4 SENSITIVE Sensitive     CEFTRIAXONE <=1 SENSITIVE Sensitive     CIPROFLOXACIN <=0.25 SENSITIVE Sensitive     GENTAMICIN <=1 SENSITIVE Sensitive     IMIPENEM <=0.25 SENSITIVE Sensitive     NITROFURANTOIN <=16 SENSITIVE Sensitive     TRIMETH/SULFA <=20 SENSITIVE Sensitive     AMPICILLIN/SULBACTAM <=2 SENSITIVE Sensitive     PIP/TAZO <=4 SENSITIVE Sensitive     Extended ESBL Value in next row Sensitive      NE[facility]IVEPerformed at [facility]    * >=100,000 COLONIES/mL ESCHERICHIA COLI  Culture, blood (Routine x 2)     Status: None (Preliminary result)   Collection Time: 09/24/16 11:32 AM  Result Value Ref Range Status   Specimen Description BLOOD LEFT ANTECUBITAL DRAWN BY RN  Final   Special Requests   Final    BOTTLES DRAWN AEROBIC AND ANAEROBIC AEB=6CC ANA=8CC   Culture NO GROWTH < 24 HOURS  Final   Report Status PENDING  Incomplete  Culture, blood (Routine x 2)     Status: None (Preliminary result)   Collection Time: 09/24/16 11:38 AM  Result Value Ref Range Status   Specimen Description BLOOD LEFT ANTECUBITAL DRAWN BY RN  Final   Special Requests BOTTLES DRAWN AEROBIC AND ANAEROBIC 6CC   Final   Culture NO GROWTH < 24 HOURS  Final   Report Status PENDING  Incomplete  MRSA PCR Screening     Status: None   Collection  Time: 09/25/16  8:02 AM  Result Value Ref Range Status   MRSA by PCR NEGATIVE NEGATIVE Final    Comment:        The GeneXpert MRSA Assay (FDA approved for NASAL specimens only), is one component of a comprehensive MRSA colonization surveillance program. It is not intended to diagnose MRSA infection nor to guide or monitor treatment for MRSA infections.   Aerobic Culture (superficial specimen)     Status: None (Preliminary result)   Collection Time: 09/26/16 11:21 AM  Result Value Ref Range Status   Specimen Description WOUND  Final   Special Requests HERNIA SAC VENTRAL  Final   Gram Stain   Final    DEGENERATED CELLULAR MATERIAL PRESENT FEW GRAM NEGATIVE COCCOBACILLI FEW GRAM POSITIVE COCCI IN PAIRS RARE GRAM POSITIVE RODS    Culture CULTURE REINCUBATED FOR BETTER GROWTH  Final   Report Status PENDING  Incomplete     Radiology Studies: US Renal  Result Date: 09/25/2016 CLINICAL DATA:  Acute renal insufficiency EXAM: RENAL / URINARY TRACT ULTRASOUND COMPLETE COMPARISON:  CT scan September 24, 2016 FINDINGS: Right Kidney: Length: 12.8 cm.  1.9 cm cyst in the right kidney. Left Kidney: Length: 12.3 cm. Echogenicity within normal limits. No mass or hydronephrosis visualized. Bladder: Appears normal for degree of bladder distention. Complex ascites seen in the right lower quadrant, greater than the left lower quadrant. IMPRESSION: 1. Right renal cysts.  No acute renal abnormality. 2. Complex ascites, right greater than left lower quadrants. Electronically Signed   By: Gerome Sam III M.D   On: 09/25/2016 20:55    Scheduled Meds: . aspirin  325 mg Oral Daily  . calcium carbonate  1,250 mg Oral  BID WC  . enoxaparin (LOVENOX) injection  40 mg Subcutaneous Q24H  . fenofibrate  160 mg Oral Daily  . FLUoxetine  10 mg Oral Daily  . insulin aspart  0-9 Units Subcutaneous TID WC  . midodrine  10 mg Oral TID WC  . piperacillin-tazobactam (ZOSYN)  IV  3.375 g Intravenous Q8H  . sodium  bicarbonate  1,300 mg Oral BID  . vancomycin  1,250 mg Intravenous Q24H   Continuous Infusions: . sodium chloride 150 mL/hr at 09/27/16 1446     LOS: 3 days    Latrelle DodrillEdwin Silva, MD [facility] Pager [phone]  If 7PM-7AM, please contact night-coverage [email or web address] Password [email or web address] 09/27/2016, 3:16 PM

## 2016-09-27 NOTE — Progress Notes (Signed)
Patient ID: Russell Trujillo, male   DOB: [birth year], 55 y.o.   MRN: [id number]  [facility]     Subjective: Feeling well again today. Ventral hernia continues to drain.  WBC WNL. Creatinine continues to improve.  Objective: Vital signs in last 24 hours: Temp:  [97.4 F (36.3 C)-98 F (36.7 C)] 97.4 F (36.3 C) (12/03 0908) Pulse Rate:  [77-84] 77 (12/03 0908) Resp:  [18-20] 18 (12/03 0908) BP: (98-110)/(52-61) 104/61 (12/03 0908) SpO2:  [100 %] 100 % (12/03 0908) Weight:  [249 lb 3.2 oz (113 kg)] 249 lb 3.2 oz (113 kg) (12/02 2134) Last BM Date: 09/27/16  Intake/Output from previous day: 12/02 0701 - 12/03 0700 In: 5840 [P.O.:1540; I.V.:3600; IV Piggyback:700] Out: 540 [Urine:540] Intake/Output this shift: No intake/output data recorded.  PE: Gen: Alert, NAD, pleasant Abd: firm, distended, large umbilical hernia with overlying skin changes draining foul-smelling purulent ascitic fluid  Pulm: CTAB, effort normal Neuro: oriented, slow mentation   Lab Results:   Recent Labs  09/26/16 0505 09/27/16 0535  WBC 10.5 9.5  HGB 10.4* 9.7*  HCT 30.3* 29.2*  PLT 112* 114*   BMET  Recent Labs  09/26/16 0505 09/27/16 0535  NA 135 136  K 3.9 3.5  CL 106 108  CO2 17* 18*  GLUCOSE 86 88  BUN 43* 21*  CREATININE 1.99* 1.37*  CALCIUM 6.8* 6.1*   PT/INR  Recent Labs  09/24/16 1132 09/25/16 0748  LABPROT 15.5* 17.4*  INR 1.22 1.41   CMP     Component Value Date/Time   NA 136 09/27/2016 0535   NA 137 06/15/2016 0816   K 3.5 09/27/2016 0535   CL 108 09/27/2016 0535   CO2 18 (L) 09/27/2016 0535   GLUCOSE 88 09/27/2016 0535   BUN 21 (H) 09/27/2016 0535   BUN 15 06/15/2016 0816   CREATININE 1.37 (H) 09/27/2016 0535   CALCIUM 6.1 (LL) 09/27/2016 0535   PROT 6.6 09/25/2016 0516   PROT 7.3 06/15/2016 0816   ALBUMIN 2.4 (L) 09/27/2016 0535   ALBUMIN 3.7 06/15/2016 0816   AST 59 (H) 09/25/2016 0516   ALT 26 09/25/2016 0516   ALKPHOS  328 (H) 09/25/2016 0516   BILITOT 1.1 09/25/2016 0516   BILITOT 0.5 06/15/2016 0816   GFRNONAA 57 (L) 09/27/2016 0535   GFRAA >60 09/27/2016 0535   Lipase     Component Value Date/Time   LIPASE 44 09/24/2016 1206       Studies/Results: [place] Renal  Result Date: 09/25/2016 CLINICAL DATA:  Acute renal insufficiency EXAM: RENAL / URINARY TRACT ULTRASOUND COMPLETE COMPARISON:  CT scan September 24, 2016 FINDINGS: Right Kidney: Length: 12.8 cm.  1.9 cm cyst in the right kidney. Left Kidney: Length: 12.3 cm. Echogenicity within normal limits. No mass or hydronephrosis visualized. Bladder: Appears normal for degree of bladder distention. Complex ascites seen in the right lower quadrant, greater than the left lower quadrant. IMPRESSION: 1. Right renal cysts.  No acute renal abnormality. 2. Complex ascites, right greater than left lower quadrants. Electronically Signed   By: Gerome Samavid  Williams III M.D   On: 09/25/2016 20:55    Anti-infectives: Anti-infectives    Start     Dose/Rate Route Frequency Ordered Stop   09/26/16 2300  vancomycin (VANCOCIN) 1,500 mg in sodium chloride 0.9 % 500 mL IVPB  Status:  Discontinued     1,500 mg 250 mL/hr over 120 Minutes Intravenous Every 48 hours 09/24/16 2220 09/25/16 1111   09/25/16 2300  vancomycin (  VANCOCIN) 1,250 mg in sodium chloride 0.9 % 250 mL IVPB     1,250 mg 166.7 mL/hr over 90 Minutes Intravenous Every 24 hours 09/25/16 1111     09/24/16 2300  piperacillin-tazobactam (ZOSYN) IVPB 3.375 g     3.375 g 12.5 mL/hr over 240 Minutes Intravenous Every 8 hours 09/24/16 2220     09/24/16 2300  vancomycin (VANCOCIN) 2,000 mg in sodium chloride 0.9 % 500 mL IVPB     2,000 mg 250 mL/hr over 120 Minutes Intravenous  Once 09/24/16 2220 09/25/16 0338       Assessment/Plan Ventral hernia with ascitic leak from umbilicus in the setting of alcoholic liver disease WBC WNL today, afebrile                On empiric antibiotics, wound culture sent  yesterday by medicine              +/- paracentesis per GI ID - vanc 12/1>>, zosyn 11/30>>  Plan: medical care per GI and renal. +/- paracentesis per GI. Continue empiric antibiotics. Follow culture from yesterday. Continue to recommend transfer to transplant center for surgical management of flood syndrome.   LOS: 3 days    Edson SnowballBROOKE A MILLER , [facility] 09/27/2016, 9:12 AM Pager: [phone] Consults: [phone] Mon-Fri 7:00 am-4:30 pm Sat-Sun 7:00 am-11:30 am

## 2016-09-28 ENCOUNTER — Inpatient Hospital Stay (HOSPITAL_COMMUNITY): Payer: Medicaid Other

## 2016-09-28 DIAGNOSIS — K429 Umbilical hernia without obstruction or gangrene: Secondary | ICD-10-CM

## 2016-09-28 LAB — GLUCOSE, CAPILLARY
GLUCOSE-CAPILLARY: 76 mg/dL (ref 65–99)
GLUCOSE-CAPILLARY: 76 mg/dL (ref 65–99)
Glucose-Capillary: 82 mg/dL (ref 65–99)
Glucose-Capillary: 191 mg/dL — ABNORMAL HIGH (ref 65–99)

## 2016-09-28 LAB — GRAM STAIN

## 2016-09-28 LAB — PROTEIN, BODY FLUID: Total protein, fluid: 3.2 g/dL

## 2016-09-28 LAB — RENAL FUNCTION PANEL
ALBUMIN: 2.6 g/dL — AB (ref 3.5–5.0)
ANION GAP: 11 (ref 5–15)
BUN: 10 mg/dL (ref 6–20)
CO2: 14 mmol/L — ABNORMAL LOW (ref 22–32)
Calcium: 6 mg/dL — CL (ref 8.9–10.3)
Chloride: 108 mmol/L (ref 101–111)
Creatinine, Ser: 1.08 mg/dL (ref 0.61–1.24)
GFR calc Af Amer: >60 mL/min (ref >60)
GFR calc non Af Amer: >60 mL/min (ref >60)
GLUCOSE: 72 mg/dL (ref 65–99)
PHOSPHORUS: 1.9 mg/dL — AB (ref 2.5–4.6)
POTASSIUM: 3.5 mmol/L (ref 3.5–5.1)
Sodium: 133 mmol/L — ABNORMAL LOW (ref 135–145)

## 2016-09-28 LAB — BODY FLUID CELL COUNT WITH DIFFERENTIAL
EOS FL: 0 %
Lymphs, Fluid: 0 %
MONOCYTE-MACROPHAGE-SEROUS FLUID: 1 % — AB (ref 50–90)
Neutrophil Count, Fluid: 99 % — ABNORMAL HIGH (ref 0–25)
WBC FLUID: 31200 uL — AB (ref 0–1000)

## 2016-09-28 LAB — URINE CULTURE

## 2016-09-28 LAB — LACTATE DEHYDROGENASE, PLEURAL OR PERITONEAL FLUID: LD, Fluid: >10000 U/L — ABNORMAL HIGH (ref 3–23)

## 2016-09-28 LAB — GLUCOSE, SEROUS FLUID: Glucose, Fluid: <20 mg/dL

## 2016-09-28 MED ORDER — SODIUM CHLORIDE 0.9 % IV SOLN
1.0000 g | Freq: Once | INTRAVENOUS | Status: AC
  Administered 2016-09-28: 1 g via INTRAVENOUS
  Filled 2016-09-28: qty 10

## 2016-09-28 MED ORDER — LIDOCAINE HCL (PF) 1 % IJ SOLN
INTRAMUSCULAR | Status: AC
  Filled 2016-09-28: qty 10

## 2016-09-28 NOTE — Progress Notes (Signed)
CRITICAL VALUE ALERT  Critical value received: Calcium 6.0  Date of notification:  09/28/16  Time of notification:  8:01 am  Critical value read back:Yes.    Nurse who received alert:  Dionisio DavidJay Kendria Halberg, RN  MD notified (1st page):  Micheal LikensE. Silva Zapata  Time of first page:  08:02 am  MD notified (2nd page):  Time of second page:  Responding MD:  Dr. Sharion DoveZapata  Time MD responded:  08:04 am

## 2016-09-28 NOTE — Progress Notes (Signed)
  Subjective: He reports mild abdominal pain  Objective: Vital signs in last 24 hours: Temp:  [97.7 F (36.5 C)-98 F (36.7 C)] 97.8 F (36.6 C) (12/04 0610) Pulse Rate:  [81-85] 85 (12/04 0610) Resp:  [17-18] 18 (12/04 0610) BP: (99-125)/(58-65) 99/58 (12/04 0610) SpO2:  [100 %] 100 % (12/04 0610) Last BM Date: 09/27/16  Intake/Output from previous day: 12/03 0701 - 12/04 0700 In: 5030 [P.O.:1080; I.V.:3600; IV Piggyback:350] Out: 550 [Urine:550] Intake/Output this shift: No intake/output data recorded.  Exam: Comfortable in appearance Abdomen obese, ascites, minimally tender Umbilical hernia with small opening draining ascites  Lab Results:   Recent Labs  09/26/16 0505 09/27/16 0535  WBC 10.5 9.5  HGB 10.4* 9.7*  HCT 30.3* 29.2*  PLT 112* 114*   BMET  Recent Labs  09/27/16 0535 09/28/16 0631  NA 136 133*  K 3.5 3.5  CL 108 108  CO2 18* 14*  GLUCOSE 88 72  BUN 21* 10  CREATININE 1.37* 1.08  CALCIUM 6.1* 6.0*   PT/INR No results for input(s): LABPROT, INR in the last 72 hours. ABG No results for input(s): PHART, HCO3 in the last 72 hours.  Invalid input(s): PCO2, PO2  Studies/Results: No results found.  Anti-infectives: Anti-infectives    Start     Dose/Rate Route Frequency Ordered Stop   09/27/16 1600  cefTRIAXone (ROCEPHIN) 1 g in dextrose 5 % 50 mL IVPB     1 g 100 mL/hr over 30 Minutes Intravenous Every 24 hours 09/27/16 1525     09/26/16 2300  vancomycin (VANCOCIN) 1,500 mg in sodium chloride 0.9 % 500 mL IVPB  Status:  Discontinued     1,500 mg 250 mL/hr over 120 Minutes Intravenous Every 48 hours 09/24/16 2220 09/25/16 1111   09/25/16 2300  vancomycin (VANCOCIN) 1,250 mg in sodium chloride 0.9 % 250 mL IVPB     1,250 mg 166.7 mL/hr over 90 Minutes Intravenous Every 24 hours 09/25/16 1111     09/24/16 2300  piperacillin-tazobactam (ZOSYN) IVPB 3.375 g  Status:  Discontinued     3.375 g 12.5 mL/hr over 240 Minutes Intravenous Every 8  hours 09/24/16 2220 09/27/16 1525   09/24/16 2300  vancomycin (VANCOCIN) 2,000 mg in sodium chloride 0.9 % 500 mL IVPB     2,000 mg 250 mL/hr over 120 Minutes Intravenous  Once 09/24/16 2220 09/25/16 [id number]      Assessment/Plan: Cirrhosis with umbilical hernia draining ascites  Recommend transfer to a tertiary care center for hernia repair given his liver disease for liver specialist to be available.  Nothing further for general surgery here to offer with regards to the hernia. Will sign off for now  LOS: 4 days    Aubrielle Stroud A 09/28/2016

## 2016-09-28 NOTE — Progress Notes (Signed)
PROGRESS NOTE [facility]   Russell Trujillo   ZOX:[id number] DOB: 16-Apr-1961  DOA: 09/24/2016 PCP: Elige Radon Dettinger, MD   Brief Narrative:  55 year old male with history of AICD implant, CHF, a vascular necrosis of the right hip, vesicorectal fistula, peritoneal hernia, heart failure, GERD, Chronic liver diseases and obesity. Sent to the emergency department at [facility] due to increasing abdominal pain and drainage from his umbilical hernia that this has been occurring for the past 2 weeks. Patient was transferred to [facility] for multidisciplinary approach surgical, nephrology and gastroenterology evaluation. Being managed for drainage from ventral hernia with antibiotics. Patient was found to have large amounts of ascites.  Patient have been noncompliant therapy he was supposed to have EGD to screen for diuresis but never was done also not taking diuretics as prescribed. Was supposed to see surgeon at [facility] colovesical fistula with never follow.  Subjective: Patient seen and examined at bedside. No new complaints. Patient doing much better. Cr improving with IVF.  Assessment & Plan: AKI - Resolved with hydration, low urine Na+  -   D/c IVF - nephrology has signed off  Cont Bicarb  Continue Mido Monitor BMP    E Coli UTI Pansensitive - patient have a chronic colovesical fistula, no compliant with follow up  Continue Ceftriaxone   Ventral hernia /Colovesical fistula - chronic, To follow up with surgeon at [facility] as was recommended  Surgery recommendations appreciated - signed off  Will continue empiric abx  No surgical intervention at this time  Wound cx still pending  Flood syndrome   Hypocalcemia - corrected Ca+ 7.4 2nd dose of calcium gluconate given  Continue Oscal  Check PTH, Vit D, and Mag   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, no changes on images  Pain control PRN   Tramadol added   DVT prophylaxis: Lovenox Code Status: Full Family Communication: None at bedside Disposition Plan: Poor prognosis, anticipate patient to be admitted for several days  Consultants:   GI  Nephro  Cardio  Gen Surgery  Procedures:   For Paracentesis   Antimicrobials:  Zosyn 11/30  Vanc 11/30   Objective: Vitals:   09/27/16 2053 09/28/16 0610 09/28/16 1509 09/28/16 1524  BP: 125/65 (!) 99/58 125/85 129/74  Pulse: 82 85    Resp: 18 18    Temp: 97.7 F (36.5 C) 97.8 F (36.6 C)    TempSrc:  Oral    SpO2: 100% 100%    Weight:      Height:        Intake/Output Summary (Last 24 hours) at 09/28/16 1622 Last data filed at 09/28/16 1300  Gross per 24 hour  Intake  3850 ml  Output              200 ml  Net             3650 ml   Filed Weights   09/24/16 2000 09/25/16 2104 09/26/16 2134  Weight: 112.8 kg (248 lb 10.9 oz) 114.6 kg (252 lb 9.6 oz) 113 kg (249 lb 3.2 oz)    Examination:  General exam: NAD  Respiratory system: CTA  Cardiovascular system: S1S2 rrr no murmurs  Gastrointestinal system: Large umbilical hernia, Continues to drain purulent material, mal odor. Non tender  Central nervous system: Alert and oriented.  Extremities:  No pedal edema.  Skin: Multiple ecchymotic lesions      Data Reviewed: I have personally reviewed following labs and imaging studies  CBC:  Recent Labs Lab 09/24/16 1132 09/25/16 0516 09/26/16 0505 09/27/16 0535  WBC 14.7* 14.1* 10.5 9.5  NEUTROABS 12.8* 12.2* 9.0* 7.6  HGB 11.6* 10.5* 10.4* 9.7*  HCT 33.2* 30.6* 30.3* 29.2*  MCV 94.9 92.4 94.4 94.8  PLT 118* 128* 112* 114*   Basic Metabolic Panel:  Recent Labs Lab 09/24/16 1132 09/24/16 1138 09/25/16 0516 09/26/16 0505 09/27/16 0535 09/28/16 0631  NA 126*  --  130* 135 136 133*  K 5.1  --  4.4 3.9 3.5 3.5  CL 91*  --  98* 106 108 108  CO2 22  --  18* 17* 18* 14*  GLUCOSE 84  --  100* 86 88 72  BUN 72*  --  60* 43* 21* 10  CREATININE 3.42*  --  2.56* 1.99* 1.37* 1.08  CALCIUM 8.9  --  7.5* 6.8* 6.1* 6.0*  MG  --  2.2  --   --  1.6*  --   PHOS  --   --   --  3.0 2.5 1.9*   GFR: Estimated Creatinine Clearance: 94.2 mL/min (by C-G formula based on SCr of 1.08 mg/dL). Liver Function Tests:  Recent Labs Lab 09/24/16 1132 09/25/16 0516 09/26/16 0505 09/27/16 0535 09/28/16 0631  AST 54* 59*  --   --   --   ALT 24 26  --   --   --   ALKPHOS 303* 328*  --   --   --   BILITOT 0.8 1.1  --   --   --   PROT 7.2 6.6  --   --   --   ALBUMIN 2.2* 1.8* 2.3* 2.4* 2.6*    Recent Labs Lab 09/24/16 1206  LIPASE 44    Recent Labs Lab 09/25/16 0516  AMMONIA 68*   Coagulation Profile:  Recent Labs Lab 09/24/16 1132 09/25/16 0748  INR 1.22 1.41   Cardiac Enzymes:  Recent Labs Lab 09/24/16 2225 09/26/16 0505  TROPONINI <0.03 0.04*   BNP (last 3 results) No results for input(s): PROBNP in the last 8760 hours. HbA1C: No results for input(s): HGBA1C in the last 72 hours. CBG:  Recent Labs Lab 09/27/16 1136 09/27/16 1606 09/27/16 2052 09/28/16 0730 09/28/16 1132  GLUCAP 91 88 83 76 82   Lipid Profile: No results for input(s): CHOL, HDL, LDLCALC, TRIG, CHOLHDL, LDLDIRECT in the last 72  hours. Thyroid Function Tests: No results for input(s): TSH, T4TOTAL, FREET4, T3FREE, THYROIDAB in the last 72 hours. Anemia Panel: No results for input(s): VITAMINB12, FOLATE, FERRITIN, TIBC, IRON, RETICCTPCT in the last 72 hours. Sepsis Labs:  Recent Labs Lab 09/24/16 1145 09/24/16 1452  LATICACIDVEN 1.71 1.65    Recent Results (from the  past 240 hour(s))  Urine culture     Status: Abnormal   Collection Time: 09/24/16 11:28 AM  Result Value Ref Range Status   Specimen Description URINE, CLEAN CATCH  Final   Special Requests NONE  Final   Culture >=100,000 COLONIES/mL ESCHERICHIA COLI (A)  Final   Report Status 09/28/2016 FINAL  Final   Organism ID, Bacteria ESCHERICHIA COLI (A)  Final      Susceptibility   Escherichia coli - MIC*    AMPICILLIN 4 SENSITIVE Sensitive     CEFAZOLIN <=4 SENSITIVE Sensitive     CEFTRIAXONE <=1 SENSITIVE Sensitive     CIPROFLOXACIN <=0.25 SENSITIVE Sensitive     GENTAMICIN <=1 SENSITIVE Sensitive     IMIPENEM <=0.25 SENSITIVE Sensitive     NITROFURANTOIN <=16 SENSITIVE Sensitive     TRIMETH/SULFA <=20 SENSITIVE Sensitive     AMPICILLIN/SULBACTAM <=2 SENSITIVE Sensitive     PIP/TAZO <=4 SENSITIVE Sensitive     Extended ESBL NEGATIVE Sensitive     * >=100,000 COLONIES/mL ESCHERICHIA COLI  Culture, blood (Routine x 2)     Status: None (Preliminary result)   Collection Time: 09/24/16 11:32 AM  Result Value Ref Range Status   Specimen Description BLOOD LEFT ANTECUBITAL DRAWN BY RN  Final   Special Requests   Final    BOTTLES DRAWN AEROBIC AND ANAEROBIC AEB=6CC ANA=8CC   Culture NO GROWTH < 24 HOURS  Final   Report Status PENDING  Incomplete  Culture, blood (Routine x 2)     Status: None (Preliminary result)   Collection Time: 09/24/16 11:38 AM  Result Value Ref Range Status   Specimen Description BLOOD LEFT ANTECUBITAL DRAWN BY RN  Final   Special Requests BOTTLES DRAWN AEROBIC AND ANAEROBIC 6CC   Final   Culture NO GROWTH < 24 HOURS  Final    Report Status PENDING  Incomplete  MRSA PCR Screening     Status: None   Collection Time: 09/25/16  8:02 AM  Result Value Ref Range Status   MRSA by PCR NEGATIVE NEGATIVE Final    Comment:        The GeneXpert MRSA Assay (FDA approved for NASAL specimens only), is one component of a comprehensive MRSA colonization surveillance program. It is not intended to diagnose MRSA infection nor to guide or monitor treatment for MRSA infections.   Aerobic Culture (superficial specimen)     Status: None (Preliminary result)   Collection Time: 09/26/16 11:21 AM  Result Value Ref Range Status   Specimen Description WOUND  Final   Special Requests HERNIA SAC VENTRAL  Final   Gram Stain   Final    DEGENERATED CELLULAR MATERIAL PRESENT FEW GRAM NEGATIVE COCCOBACILLI FEW GRAM POSITIVE COCCI IN PAIRS RARE GRAM POSITIVE RODS    Culture   Final    ABUNDANT ENTEROCOCCUS FAECALIS MODERATE STAPHYLOCOCCUS AUREUS SUSCEPTIBILITIES TO FOLLOW    Report Status PENDING  Incomplete     Radiology Studies: [place] Paracentesis  Result Date: 09/28/2016 INDICATION: Ascites; cirrhosis EXAM: ULTRASOUND-GUIDED PARACENTESIS COMPARISON:  None. MEDICATIONS: 10 cc 1% lidocaine COMPLICATIONS: None immediate. TECHNIQUE: Informed written consent was obtained from the patient after a discussion of the risks, benefits and alternatives to treatment. A timeout was performed prior to the initiation of the procedure. Initial ultrasound scanning demonstrates a moderate amount of septated ascites within the right lower abdominal quadrant. The right lower abdomen was prepped and draped in the usual sterile fashion. 1% lidocaine with epinephrine was used for local anesthesia. Under direct ultrasound guidance,  a 19 gauge, 7-cm, Yueh catheter was introduced. An ultrasound image was saved for documentation purposed. The paracentesis was performed. The catheter was removed and a dressing was applied. The patient tolerated the procedure  well without immediate post procedural complication. FINDINGS: A total of approximately 220 cc of cloudy yellow fluid was removed. Samples were sent to the laboratory as requested by the clinical team. IMPRESSION: Successful ultrasound-guided paracentesis yielding 220 cc of peritoneal fluid. Read by Robet Leu [facility] Electronically Signed   By: Corlis Leak M.D.   On: 09/28/2016 15:39    Scheduled Meds: . aspirin  325 mg Oral Daily  . calcium carbonate  1,250 mg Oral BID WC  . cefTRIAXone (ROCEPHIN)  IV  1 g Intravenous Q24H  . enoxaparin (LOVENOX) injection  40 mg Subcutaneous Q24H  . fenofibrate  160 mg Oral Daily  . FLUoxetine  10 mg Oral Daily  . insulin aspart  0-9 Units Subcutaneous TID WC  . lidocaine (PF)      . midodrine  10 mg Oral TID WC  . sodium bicarbonate  1,300 mg Oral BID   Continuous Infusions:    LOS: 4 days    Latrelle Dodrill, MD [facility] Pager [phone]  If 7PM-7AM, please contact night-coverage [email or web address] Password [email or web address] 09/28/2016, 4:22 PM

## 2016-09-28 NOTE — Progress Notes (Signed)
His Serum creatinine has normalized.  Nothing further to add.  No need for follow up with our office.  Will sign off.

## 2016-09-28 NOTE — Procedures (Signed)
   US guided RLQ paracentesis Pocket of fluid was septated Collected 220 cc dark yellow fluid  Sent for labs per MD  Pt tolerated well

## 2016-09-29 LAB — AEROBIC CULTURE  (SUPERFICIAL SPECIMEN)

## 2016-09-29 LAB — AEROBIC CULTURE W GRAM STAIN (SUPERFICIAL SPECIMEN)

## 2016-09-29 LAB — RENAL FUNCTION PANEL
Albumin: 2.4 g/dL — ABNORMAL LOW (ref 3.5–5.0)
Anion gap: 12 (ref 5–15)
CHLORIDE: 105 mmol/L (ref 101–111)
CO2: 17 mmol/L — AB (ref 22–32)
CREATININE: 0.92 mg/dL (ref 0.61–1.24)
Calcium: 6 mg/dL — CL (ref 8.9–10.3)
GFR calc Af Amer: >60 mL/min (ref >60)
GFR calc non Af Amer: >60 mL/min (ref >60)
Glucose, Bld: 95 mg/dL (ref 65–99)
Phosphorus: 1.8 mg/dL — ABNORMAL LOW (ref 2.5–4.6)
Potassium: 3.2 mmol/L — ABNORMAL LOW (ref 3.5–5.1)
Sodium: 134 mmol/L — ABNORMAL LOW (ref 135–145)

## 2016-09-29 LAB — GLUCOSE, CAPILLARY
Glucose-Capillary: 99 mg/dL (ref 65–99)
Glucose-Capillary: 106 mg/dL — ABNORMAL HIGH (ref 65–99)

## 2016-09-29 LAB — MAGNESIUM: Magnesium: 1.4 mg/dL — ABNORMAL LOW (ref 1.7–2.4)

## 2016-09-29 MED ORDER — POTASSIUM PHOSPHATES 15 MMOLE/5ML IV SOLN
10.0000 meq | Freq: Once | INTRAVENOUS | Status: DC
  Filled 2016-09-29: qty 2.27

## 2016-09-29 NOTE — Care Management Note (Signed)
Case Management Note  Patient Details  Name: Russell Trujillo MRN: [id number] Date of Birth: 05-01-1961  Subjective/Objective:     AKI, E Coli UTI, Cirrhosis              Action/Plan: Discharge Planning:  NCM spoke to pt about importance of continuing medical treatment for infection. Pt states he is afraid something may happen to his mother at home that she "may die" and he needs to be there to care for her. Has a brother but pt did not want to call brother or speak to his mother while NCM was in the room. Pt wants to sign out of hospital and explained it will be AMA. States he has signed out before and understands that his hospital stay is not complete.     Expected Discharge Date:                Expected Discharge Plan:  Against Medical Advice  In-House Referral:  NA  Discharge planning Services  CM Consult, NA  Post Acute Care Choice:  NA Choice offered to:  NA  DME Arranged:  N/A DME Agency:  NA  HH Arranged:  NA HH Agency:  NA  Status of Service:  Completed, signed off  If discussed at Long Length of Stay Meetings, dates discussed:    Additional Comments:  Elliot CousinShavis, Elbridge Magowan Ellen, RN 09/29/2016, 12:01 PM

## 2016-09-29 NOTE — Discharge Summary (Addendum)
Russell Trujillo.                                                                                                                                           AMA  Patient at this time expresses desire to leave the Hospital immidiately, patient has been warned that this is not Medically advisable at this time, and can result in Medical complications like Death and Disability, patient understands and accepts the risks involved and assumes full responsibilty of this decision.  Patient need higher level of care due to multiple acute on chronic conditions. Patient is high risk for readmission. Please refer to tertiary center facility.    Latrelle DodrillEdwin Silva M.D on 09/29/2016 at 1:45 PM  [facility]   Last Note Below  Brief Narrative:  55 year old male with history of AICD implant, CHF, a vascular necrosis of the right hip, vesicorectal fistula, peritoneal hernia, heart failure, GERD, Chronic liver diseases and obesity. Sent to the emergency department at [facility] due to increasing abdominal pain and drainage from his umbilical hernia that this has been occurring for the past 2 weeks. Patient was [facility] for multidisciplinary approach surgical, nephrology and gastroenterology evaluation. Being managed for drainage from ventral hernia with antibiotics. Patient was found to have large amounts of ascites.  Patient have been noncompliant therapy he was supposed to have EGD to screen for diuresis but never was done also not taking diuretics as prescribed. Was supposed to see surgeon at [facility] colovesical fistula with never follow.  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, Patient had alcohol positive, Again I have discussed this case with GI for a liver center transfer, Paracentesis ordered remove only 1L - diagnostic labs ordered much fluid given risk for HRS  GI recommendations appreciated  Patient on empiric abx    Lab work up pending    AKI  - Resolved with hydration, low urine Na+  -   Cont Bicarb  Continue Mido Monitor BMP    E Coli UTI Pansensitive - patient have a chronic colovesical fistula, no compliant with follow up  Continue Ceftriaxone   Ventral hernia /Colovesical fistula - chronic, [facility] as was [facility]  Surgery recommendations appreciated - signed off  Will continue empiric abx  No surgical intervention at this time  Wound cx still pending  Flood syndrome   Hypocalcemia - corrected Ca+ 7.4 2nd dose of calcium gluconate given  Continue Oscal  Check PTH, Vit D, and Mag   Hypertension - now hypotensive, ? 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, no changes on images  Pain control PRN   Tramadol added   DVT prophylaxis: Lovenox Code Status: Full Family Communication: None at bedside Disposition Plan: Poor prognosis, anticipate patient to be admitted for several days  Consultants:   GI  Nephro  Cardio  Gen Surgery  Procedures:   For Paracentesis   Antimicrobials:  Zosyn 11/30  Vanc 11/30   Objective:       Vitals:   09/27/16 2053 09/28/16 0610 09/28/16 1509 09/28/16 1524  BP: 125/65 (!) 99/58 125/85 129/74  Pulse: 82 85    Resp: 18 18    Temp: 97.7 F (36.5 C) 97.8 F (36.6 C)    TempSrc:  Oral    SpO2: 100% 100%    Weight:      Height:        Intake/Output Summary (Last 24 hours) at 09/28/16 1622 Last data filed at 09/28/16 1300  Gross per 24 hour  Intake             3850 ml  Output              200 ml  Net             3650 ml        Filed Weights   09/24/16 2000  09/25/16 2104 09/26/16 2134  Weight: 112.8 kg (248 lb 10.9 oz) 114.6 kg (252 lb 9.6 oz) 113 kg (249 lb 3.2 oz)    Examination:  General exam: NAD  Respiratory system: CTA  Cardiovascular system: S1S2 rrr no murmurs  Gastrointestinal system: Large umbilical hernia, Continues to drain purulent material, mal odor. Non tender  Central nervous system: Alert and oriented.  Extremities: No pedal edema.  Skin: Multiple ecchymotic lesions      Data Reviewed: I have personally reviewed following labs and imaging studies  CBC:  Last Labs    Recent Labs Lab 09/24/16 1132 09/25/16 0516 09/26/16 0505 09/27/16 0535  WBC 14.7* 14.1* 10.5 9.5  NEUTROABS 12.8* 12.2* 9.0* 7.6  HGB 11.6* 10.5* 10.4* 9.7*  HCT 33.2* 30.6* 30.3* 29.2*  MCV 94.9 92.4 94.4 94.8  PLT 118* 128* 112* 114*     Basic Metabolic Panel:  Last Labs    Recent Labs Lab 09/24/16 1132 09/24/16 1138 09/25/16 0516 09/26/16 0505 09/27/16 0535 09/28/16 0631  NA 126*  --  130* 135 136 133*  K 5.1  --  4.4 3.9 3.5 3.5  CL 91*  --  98* 106 108 108  CO2 22  --  18* 17* 18* 14*  GLUCOSE 84  --  100* 86 88 72  BUN 72*  --  60* 43* 21* 10  CREATININE 3.42*  --  2.56* 1.99* 1.37* 1.08  CALCIUM 8.9  --  7.5* 6.8* 6.1* 6.0*  MG  --  2.2  --   --  1.6*  --   PHOS  --   --   --  3.0 2.5 1.9*     GFR: Estimated Creatinine Clearance: 94.2 mL/min (by C-G formula based on SCr of 1.08 mg/dL). Liver Function Tests:  Last Labs    Recent Labs Lab 09/24/16 1132 09/25/16 0516 09/26/16 0505 09/27/16 0535 09/28/16 0631  AST 54* 59*  --   --   --   ALT 24 26  --   --   --   ALKPHOS 303* 328*  --   --   --  BILITOT 0.8 1.1  --   --   --   PROT 7.2 6.6  --   --   --   ALBUMIN 2.2* 1.8* 2.3* 2.4* 2.6*      Last Labs    Recent Labs Lab 09/24/16 1206  LIPASE 44      Last Labs    Recent Labs Lab 09/25/16 0516  AMMONIA 68*     Coagulation Profile:  Last Labs    Recent Labs Lab  09/24/16 1132 09/25/16 0748  INR 1.22 1.41     Cardiac Enzymes:  Last Labs    Recent Labs Lab 09/24/16 2225 09/26/16 0505  TROPONINI <0.03 0.04*     BNP (last 3 results) Recent Labs (within last 365 days)  No results for input(s): PROBNP in the last 8760 hours.   HbA1C: Recent Labs (last 2 labs)   No results for input(s): HGBA1C in the last 72 hours.   CBG:  Last Labs    Recent Labs Lab 09/27/16 1136 09/27/16 1606 09/27/16 2052 09/28/16 0730 09/28/16 1132  GLUCAP 91 88 83 76 82     Lipid Profile: Recent Labs (last 2 labs)   No results for input(s): CHOL, HDL, LDLCALC, TRIG, CHOLHDL, LDLDIRECT in the last 72 hours.   Thyroid Function Tests: Recent Labs (last 2 labs)   No results for input(s): TSH, T4TOTAL, FREET4, T3FREE, THYROIDAB in the last 72 hours.   Anemia Panel: Recent Labs (last 2 labs)   No results for input(s): VITAMINB12, FOLATE, FERRITIN, TIBC, IRON, RETICCTPCT in the last 72 hours.   Sepsis Labs:  Last Labs    Recent Labs Lab 09/24/16 1145 09/24/16 1452  LATICACIDVEN 1.71 1.65             Recent Results (from the past 240 hour(s))  Urine culture     Status: Abnormal   Collection Time: 09/24/16 11:28 AM  Result Value Ref Range Status   Specimen Description URINE, CLEAN CATCH  Final   Special Requests NONE  Final   Culture >=100,000 COLONIES/mL ESCHERICHIA COLI (A)  Final   Report Status 09/28/2016 FINAL  Final   Organism ID, Bacteria ESCHERICHIA COLI (A)  Final      Susceptibility   Escherichia coli - MIC*    AMPICILLIN 4 SENSITIVE Sensitive     CEFAZOLIN <=4 SENSITIVE Sensitive     CEFTRIAXONE <=1 SENSITIVE Sensitive     CIPROFLOXACIN <=0.25 SENSITIVE Sensitive     GENTAMICIN <=1 SENSITIVE Sensitive     IMIPENEM <=0.25 SENSITIVE Sensitive     NITROFURANTOIN <=16 SENSITIVE Sensitive     TRIMETH/SULFA <=20 SENSITIVE Sensitive     AMPICILLIN/SULBACTAM <=2 SENSITIVE Sensitive      PIP/TAZO <=4 SENSITIVE Sensitive     Extended ESBL NEGATIVE Sensitive     * >=100,000 COLONIES/mL ESCHERICHIA COLI  Culture, blood (Routine x 2)     Status: None (Preliminary result)   Collection Time: 09/24/16 11:32 AM  Result Value Ref Range Status   Specimen Description BLOOD LEFT ANTECUBITAL DRAWN BY RN  Final   Special Requests   Final    BOTTLES DRAWN AEROBIC AND ANAEROBIC AEB=6CC ANA=8CC   Culture NO GROWTH < 24 HOURS  Final   Report Status PENDING  Incomplete  Culture, blood (Routine x 2)     Status: None (Preliminary result)   Collection Time: 09/24/16 11:38 AM  Result Value Ref Range Status   Specimen Description BLOOD LEFT ANTECUBITAL DRAWN BY RN  Final   Special Requests  BOTTLES DRAWN AEROBIC AND ANAEROBIC 6CC   Final   Culture NO GROWTH < 24 HOURS  Final   Report Status PENDING  Incomplete  MRSA PCR Screening     Status: None   Collection Time: 09/25/16  8:02 AM  Result Value Ref Range Status   MRSA by PCR NEGATIVE NEGATIVE Final    Comment:        The GeneXpert MRSA Assay (FDA approved for NASAL specimens only), is one component of a comprehensive MRSA colonization surveillance program. It is not intended to diagnose MRSA infection nor to guide or monitor treatment for MRSA infections.  Aerobic Culture (superficial specimen)     Status: None (Preliminary result)   Collection Time: 09/26/16 11:21 AM  Result Value Ref Range Status   Specimen Description WOUND  Final   Special Requests HERNIA SAC VENTRAL  Final   Gram Stain   Final    DEGENERATED CELLULAR MATERIAL PRESENT FEW GRAM NEGATIVE COCCOBACILLI FEW GRAM POSITIVE COCCI IN PAIRS RARE GRAM POSITIVE RODS   Culture   Final    ABUNDANT ENTEROCOCCUS FAECALIS MODERATE STAPHYLOCOCCUS AUREUS SUSCEPTIBILITIES TO FOLLOW   Report Status PENDING  Incomplete     Radiology Studies:  Imaging Results (Last 48 hours)  [place] Paracentesis  Result Date:  09/28/2016 INDICATION: Ascites; cirrhosis EXAM: ULTRASOUND-GUIDED PARACENTESIS COMPARISON:  None. MEDICATIONS: 10 cc 1% lidocaine COMPLICATIONS: None immediate. TECHNIQUE: Informed written consent was obtained from the patient after a discussion of the risks, benefits and alternatives to treatment. A timeout was performed prior to the initiation of the procedure. Initial ultrasound scanning demonstrates a moderate amount of septated ascites within the right lower abdominal quadrant. The right lower abdomen was prepped and draped in the usual sterile fashion. 1% lidocaine with epinephrine was used for local anesthesia. Under direct ultrasound guidance, a 19 gauge, 7-cm, Yueh catheter was introduced. An ultrasound image was saved for documentation purposed. The paracentesis was performed. The catheter was removed and a dressing was applied. The patient tolerated the procedure well without immediate post procedural complication. FINDINGS: A total of approximately 220 cc of cloudy yellow fluid was removed. Samples were sent to the laboratory as requested by the clinical team. IMPRESSION: Successful ultrasound-guided paracentesis yielding 220 cc of peritoneal fluid. Read by Robet LeuPamela A [facility] Electronically Signed   By: Corlis Leak  Hassell M.D.   On: 09/28/2016 15:39     Scheduled Meds: . aspirin  325 mg Oral Daily  . calcium carbonate  1,250 mg Oral BID WC  . cefTRIAXone (ROCEPHIN)  IV  1 g Intravenous Q24H  . enoxaparin (LOVENOX) injection  40 mg Subcutaneous Q24H  . fenofibrate  160 mg Oral Daily  . FLUoxetine  10 mg Oral Daily  . insulin aspart  0-9 Units Subcutaneous TID WC  . lidocaine (PF)      . midodrine  10 mg Oral TID WC  . sodium bicarbonate  1,300 mg Oral BID

## 2016-09-30 LAB — CULTURE, BLOOD (ROUTINE X 2)
CULTURE: NO GROWTH
CULTURE: NO GROWTH

## 2016-09-30 LAB — PARATHYROID HORMONE, INTACT (NO CA): PTH: 122 pg/mL — ABNORMAL HIGH (ref 15–65)

## 2016-09-30 LAB — PATHOLOGIST SMEAR REVIEW: Path Review: NEGATIVE

## 2016-09-30 LAB — VITAMIN D 25 HYDROXY (VIT D DEFICIENCY, FRACTURES): Vit D, 25-Hydroxy: 14.7 ng/mL — ABNORMAL LOW (ref 30.0–100.0)

## 2016-10-03 LAB — CULTURE, BODY FLUID-BOTTLE: CULTURE: NO GROWTH

## 2016-10-26 ENCOUNTER — Other Ambulatory Visit: Payer: Self-pay | Admitting: *Deleted

## 2016-10-28 MED ORDER — FLUOXETINE HCL 10 MG PO CAPS: 10.0000 mg | ORAL_CAPSULE | Freq: Every day | ORAL | 2 refills | Status: AC

## 2016-10-30 ENCOUNTER — Telehealth: Payer: Self-pay | Admitting: Cardiology

## 2016-10-30 ENCOUNTER — Encounter: Payer: Medicaid Other | Admitting: *Deleted

## 2016-10-30 NOTE — Telephone Encounter (Signed)
Spoke with pt and reminded pt of remote transmission that is due today. Pt verbalized understanding.   

## 2016-12-18 ENCOUNTER — Encounter: Payer: Self-pay | Admitting: Cardiology

## 2017-01-06 IMAGING — US US PARACENTESIS
1 series · 4 of 4 positions shown · non-contrast
Comparison: none

INDICATION: Cirrhosis, ascites

[Series 1: us paracentesis · 0.25mm/px · 4 of 4 slices shown]
[im 1/4]
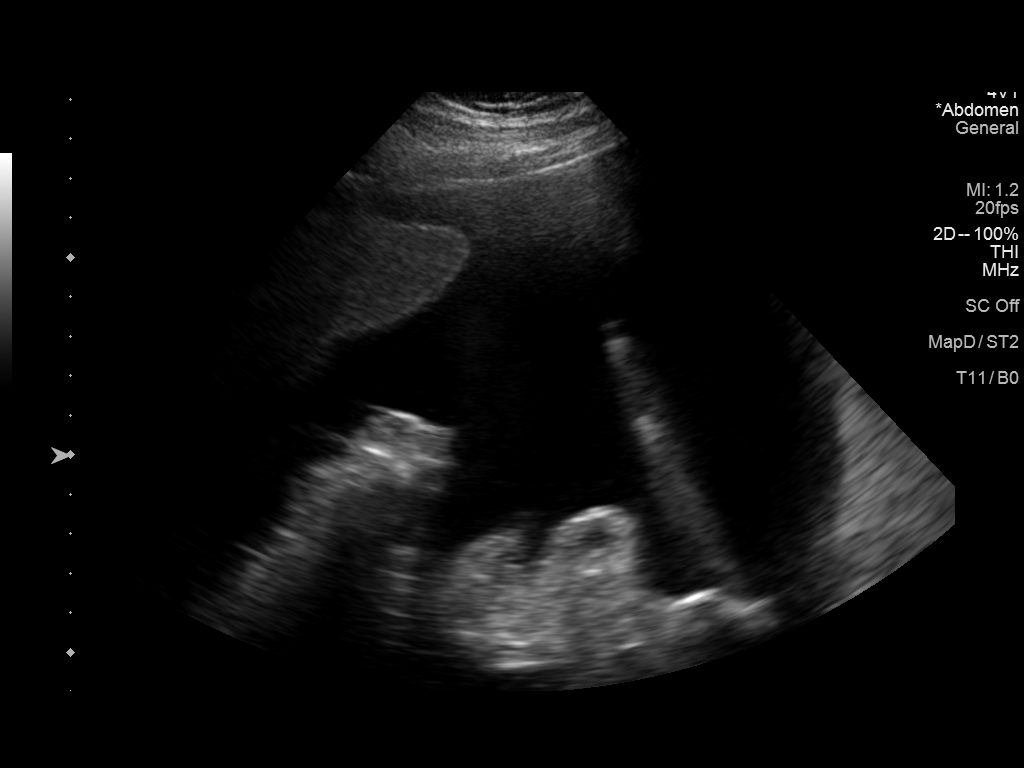
[im 2/4]
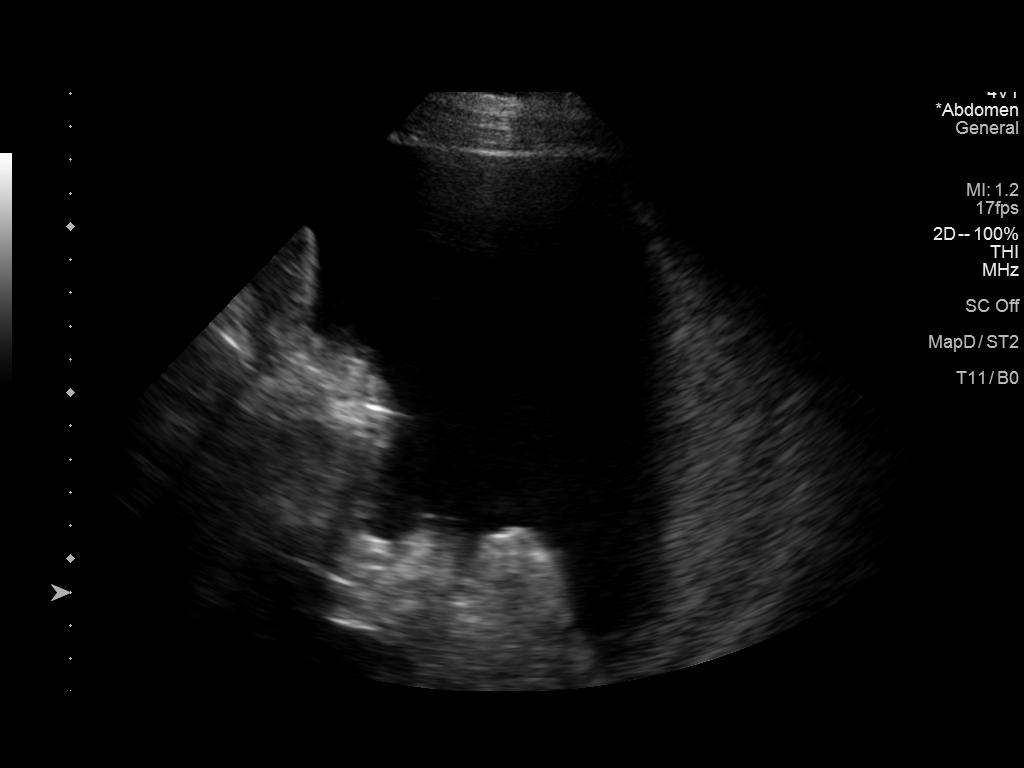
[im 3/4]
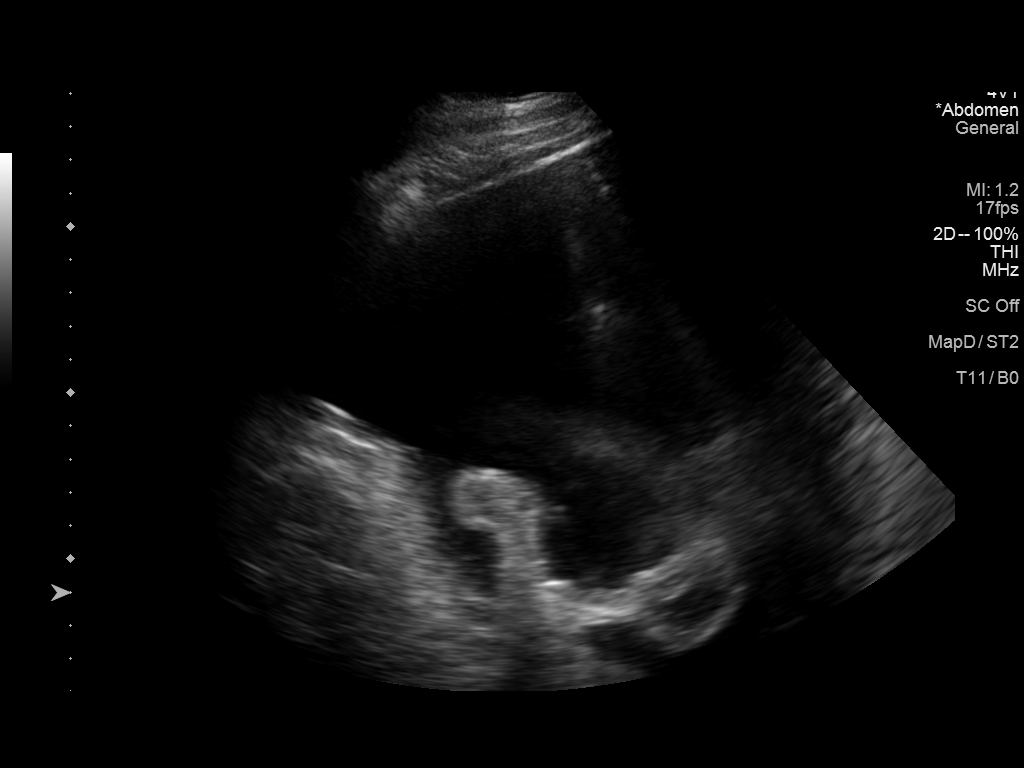
[im 4/4]
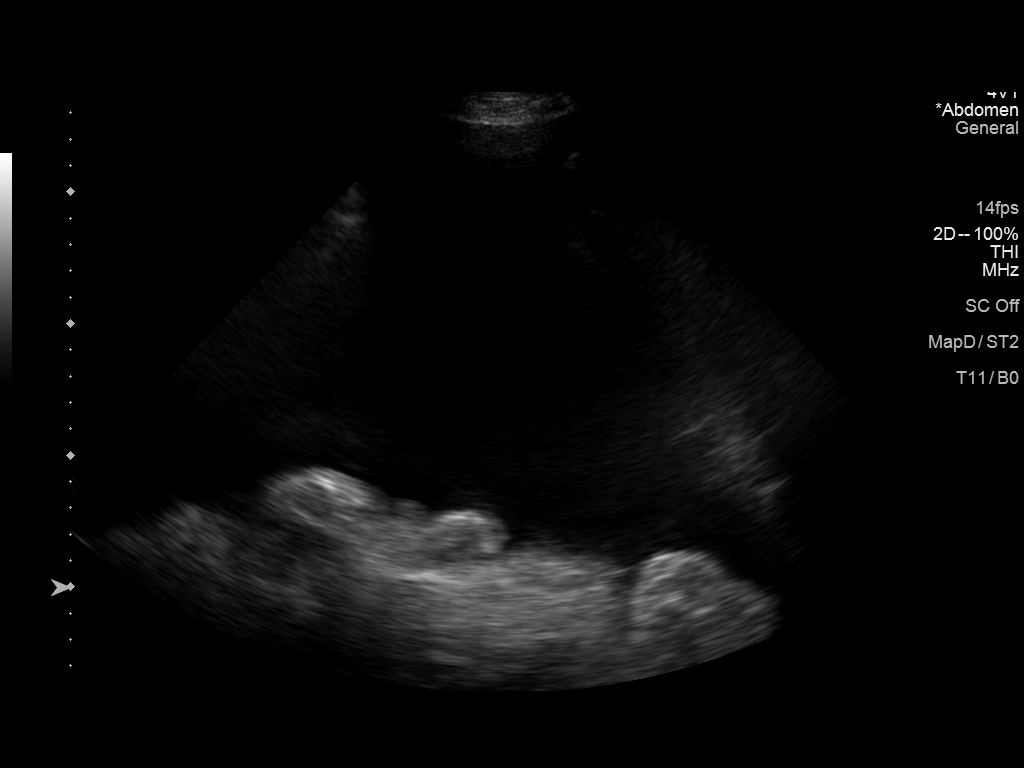

[4 of 4 positions shown; findings below may reference images not displayed]

EXAM:
ULTRASOUND GUIDED THERAPEUTIC PARACENTESIS

MEDICATIONS:
None.

COMPLICATIONS:
None immediate.

PROCEDURE:
Procedure, benefits, and risks of procedure were discussed with
patient.

Written informed consent for procedure was obtained.

Time out protocol followed.

Adequate collection of ascites localized by ultrasound in LEFT lower
quadrant.

Skin prepped and draped in usual sterile fashion.

Skin and soft tissues anesthetized with 10 mL of 1% lidocaine.

5 French Yueh catheter placed into peritoneal cavity.

15 L of dark amber fluid aspirated by vacuum bottle suction.

Procedure tolerated well by patient without immediate complication.
FINDINGS: As above
IMPRESSION: Successful ultrasound-guided paracentesis yielding 15 liters of
peritoneal fluid.

## 2017-01-07 ENCOUNTER — Other Ambulatory Visit: Payer: Self-pay | Admitting: *Deleted

## 2017-01-07 MED ORDER — IBUPROFEN 800 MG PO TABS: 800.0000 mg | ORAL_TABLET | Freq: Three times a day (TID) | ORAL | 5 refills | Status: DC | PRN

## 2017-01-11 ENCOUNTER — Telehealth: Payer: Self-pay | Admitting: Gastroenterology

## 2017-01-11 ENCOUNTER — Other Ambulatory Visit: Payer: Self-pay

## 2017-01-11 DIAGNOSIS — R188 Other ascites: Secondary | ICD-10-CM

## 2017-01-11 NOTE — Telephone Encounter (Signed)
Pt called to reschedule his appt from November and is aware of OV in April. He also wants to have fluid drawn and can be reached at [phone]

## 2017-01-11 NOTE — Telephone Encounter (Signed)
[name] please advise about patient having para. He does not currently have orders. His last OV was 06/04/16.

## 2017-01-11 NOTE — Telephone Encounter (Signed)
Needs office visit first unless he is symptomatic from recurrent ascites.

## 2017-01-11 NOTE — Telephone Encounter (Signed)
Called pt. He said he is swelling and starting to have some shortness of breath. Informed AB. She advised that he could have a para with Albumin 25 grams IV after 4 liters, then 12.5 grams Albumin IV after every 4 liters. No labs. She request pt have OV within a week. Informed pt. OV scheduled 01/18/17 at 11:30 with AB. Cancelled previously scheduled OV in April.  [facility] [employer] [facility]. US para scheduled for 01/14/17 at 11:00, arrive at 10:45am. Called and informed pt. Albumin order faxed to [facility].

## 2017-01-14 ENCOUNTER — Other Ambulatory Visit: Payer: Self-pay | Admitting: Gastroenterology

## 2017-01-14 ENCOUNTER — Ambulatory Visit (HOSPITAL_COMMUNITY)
Admission: RE | Admit: 2017-01-14 | Discharge: 2017-01-14 | Disposition: A | Discharge status: Home / Self Care | Payer: Medicaid Other | Source: Ambulatory Visit | Attending: Gastroenterology | Admitting: Gastroenterology

## 2017-01-14 ENCOUNTER — Telehealth: Payer: Self-pay

## 2017-01-14 DIAGNOSIS — R188 Other ascites: Secondary | ICD-10-CM

## 2017-01-14 NOTE — Telephone Encounter (Signed)
Attempted to do PA for US abdomen limited via [employer] website. Pt does not need prior authorization.

## 2017-01-18 ENCOUNTER — Ambulatory Visit: Payer: Medicaid Other | Admitting: Gastroenterology

## 2017-01-20 ENCOUNTER — Other Ambulatory Visit: Payer: Self-pay

## 2017-01-20 ENCOUNTER — Ambulatory Visit: Payer: Medicaid Other | Admitting: Gastroenterology

## 2017-01-20 ENCOUNTER — Encounter: Payer: Self-pay | Admitting: Gastroenterology

## 2017-01-20 ENCOUNTER — Ambulatory Visit (INDEPENDENT_AMBULATORY_CARE_PROVIDER_SITE_OTHER): Payer: Medicaid Other | Admitting: Gastroenterology

## 2017-01-20 VITALS — BP 133/72 | HR 93 | Temp 97.8°F | Ht 68.0 in | Wt 268.2 lb

## 2017-01-20 DIAGNOSIS — I8500 Esophageal varices without bleeding: Secondary | ICD-10-CM

## 2017-01-20 DIAGNOSIS — K7030 Alcoholic cirrhosis of liver without ascites: Secondary | ICD-10-CM | POA: Diagnosis not present

## 2017-01-20 NOTE — Patient Instructions (Addendum)
Please have blood work done today. We can then decide what dosage of fluid pills to put you on.  Do not drink Wink. This is probably where the weight gain and retaining fluid is coming from. I have attached a low sodium diet for you.  We have scheduled you for an upper endoscopy with Dr. Darrick Penna in the near future.  I would like to see you in 3 months.   Low-Sodium Eating Plan Sodium, which is an element that makes up salt, helps you maintain a healthy balance of fluids in your body. Too much sodium can increase your blood pressure and cause fluid and waste to be held in your body. Your health care provider or dietitian may recommend following this plan if you have high blood pressure (hypertension), kidney disease, liver disease, or heart failure. Eating less sodium can help lower your blood pressure, reduce swelling, and protect your heart, liver, and kidneys. What are tips for following this plan? General guidelines   Most people on this plan should limit their sodium intake to 1,500-2,000 mg (milligrams) of sodium each day. Reading food labels   The Nutrition Facts label lists the amount of sodium in one serving of the food. If you eat more than one serving, you must multiply the listed amount of sodium by the number of servings.  Choose foods with less than 140 mg of sodium per serving.  Avoid foods with 300 mg of sodium or more per serving. Shopping   Look for lower-sodium products, often labeled as "low-sodium" or "no salt added."  Always check the sodium content even if foods are labeled as "unsalted" or "no salt added".  Buy fresh foods.  Avoid canned foods and premade or frozen meals.  Avoid canned, cured, or processed meats  Buy breads that have less than 80 mg of sodium per slice. Cooking   Eat more home-cooked food and less restaurant, buffet, and fast food.  Avoid adding salt when cooking. Use salt-free seasonings or herbs instead of table salt or sea salt. Check  with your health care provider or pharmacist before using salt substitutes.  Cook with plant-based oils, such as canola, sunflower, or olive oil. Meal planning   When eating at a restaurant, ask that your food be prepared with less salt or no salt, if possible.  Avoid foods that contain MSG (monosodium glutamate). MSG is sometimes added to [place] food, bouillon, and some canned foods. What foods are recommended? The items listed may not be a complete list. Talk with your dietitian about what dietary choices are best for you. Grains  Low-sodium cereals, including oats, puffed wheat and rice, and shredded wheat. Low-sodium crackers. Unsalted rice. Unsalted pasta. Low-sodium bread. Whole-grain breads and whole-grain pasta. Vegetables  Fresh or frozen vegetables. "No salt added" canned vegetables. "No salt added" tomato sauce and paste. Low-sodium or reduced-sodium tomato and vegetable juice. Fruits  Fresh, frozen, or canned fruit. Fruit juice. Meats and other protein foods  Fresh or frozen (no salt added) meat, poultry, seafood, and fish. Low-sodium canned tuna and salmon. Unsalted nuts. Dried peas, beans, and lentils without added salt. Unsalted canned beans. Eggs. Unsalted nut butters. Dairy  Milk. Soy milk. Cheese that is naturally low in sodium, such as ricotta cheese, fresh mozzarella, or Swiss cheese Low-sodium or reduced-sodium cheese. Cream cheese. Yogurt. Fats and oils  Unsalted butter. Unsalted margarine with no trans fat. Vegetable oils such as canola or olive oils. Seasonings and other foods  Fresh and dried herbs and spices.  Salt-free seasonings. Low-sodium mustard and ketchup. Sodium-free salad dressing. Sodium-free light mayonnaise. Fresh or refrigerated horseradish. Lemon juice. Vinegar. Homemade, reduced-sodium, or low-sodium soups. Unsalted popcorn and pretzels. Low-salt or salt-free chips. What foods are not recommended? The items listed may not be a complete list. Talk with  your dietitian about what dietary choices are best for you. Grains  Instant hot cereals. Bread stuffing, pancake, and biscuit mixes. Croutons. Seasoned rice or pasta mixes. Noodle soup cups. Boxed or frozen macaroni and cheese. Regular salted crackers. Self-rising flour. Vegetables  Sauerkraut, pickled vegetables, and relishes. Olives. [place] fries. Onion rings. Regular canned vegetables (not low-sodium or reduced-sodium). Regular canned tomato sauce and paste (not low-sodium or reduced-sodium). Regular tomato and vegetable juice (not low-sodium or reduced-sodium). Frozen vegetables in sauces. Meats and other protein foods  Meat or fish that is salted, canned, smoked, spiced, or pickled. Bacon, ham, sausage, hotdogs, corned beef, chipped beef, packaged lunch meats, salt pork, jerky, pickled herring, anchovies, regular canned tuna, sardines, salted nuts. Dairy  Processed cheese and cheese spreads. Cheese curds. Blue cheese. Feta cheese. String cheese. Regular cottage cheese. Buttermilk. Canned milk. Fats and oils  Salted butter. Regular margarine. Ghee. Bacon fat. Seasonings and other foods  Onion salt, garlic salt, seasoned salt, table salt, and sea salt. Canned and packaged gravies. Worcestershire sauce. Tartar sauce. Barbecue sauce. Teriyaki sauce. Soy sauce, including reduced-sodium. Steak sauce. Fish sauce. Oyster sauce. Cocktail sauce. Horseradish that you find on the shelf. Regular ketchup and mustard. Meat flavorings and tenderizers. Bouillon cubes. Hot sauce and Tabasco sauce. Premade or packaged marinades. Premade or packaged taco seasonings. Relishes. Regular salad dressings. Salsa. Potato and tortilla chips. Corn chips and puffs. Salted popcorn and pretzels. Canned or dried soups. Pizza. Frozen entrees and pot pies. Summary  Eating less sodium can help lower your blood pressure, reduce swelling, and protect your heart, liver, and kidneys.  Most people on this plan should limit their  sodium intake to 1,500-2,000 mg (milligrams) of sodium each day.  Canned, boxed, and frozen foods are high in sodium. Restaurant foods, fast foods, and pizza are also very high in sodium. You also get sodium by adding salt to food.  Try to cook at home, eat more fresh fruits and vegetables, and eat less fast food, canned, processed, or prepared foods. This information is not intended to replace advice given to you by your health care provider. Make sure you discuss any questions you have with your health care provider. Document Released: 04/03/2002 Document Revised: 10/05/2016 Document Reviewed: 10/05/2016 Elsevier Interactive Patient Education  2017 [employer]

## 2017-01-20 NOTE — Progress Notes (Addendum)
REVIEWED-NO ADDITIONAL RECOMMENDATIONS.  Referring Provider: Dettinger, Fransisca Kaufmann, MD Primary Care Physician:  Fransisca Kaufmann Dettinger, MD Primary GI: Dr. Oneida Alar   Chief Complaint  Patient presents with  . Cirrhosis    gaining weight    HPI:   Russell Trujillo is a 56 y.o. male presenting today with a history of ETOH cirrhosis, need for EGD for variceal screening. He cancelled two previous appointments last year. Colonoscopy in Nov 2016 by Dr. Berkley Harvey with [facility] noting two 6-8 mm polyps (tubluar adenomas), mild diverticulosis.   Needs hepatoma screening May 2018.   Admitted 12/17 to [facility] with complicated ruptured umbilical hernia and purulent ascites. Underwent exploratory laparotomy, lysis of adhesions, evacuation of subfascial and intraabdominal abscesses. Prolonged hospitalization. He was actually at [facility] prior to this but left AMA as his mother was dying.   Mom passed away 01-30-2023. Not compliant with fluid pills. Trying to stay away from fast food. Drinking the soda "Wink". Drinks about a liter a day. Drank alcohol right after mother's death and described it as a 3 day binge. Unable to quantify. He is taking Ibuprofen prn, although I counseled him on avoiding this. He is also taking ASA 325.   He recently felt that he had swelling in his abdomen but limited ultrasound was negative for this. He doesn't understand why he keeps gaining weight.   Past Medical History:  Diagnosis Date  . AICD (automatic cardioverter/defibrillator) present   . Allergy   . 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 Depression   . Fistula    Bladder Fistula  . GERD (gastroesophageal reflux disease)   . Gout   . Hepatitis    ? unsure   . Hyperlipidemia   . Hypertension     Past Surgical History:  Procedure Laterality Date  . COLONOSCOPY  Nov 2016   Dr. Berkley Harvey at [place]: two 6-8 mm polyps (tubular adenomas). Surveillance 2019.   Marland Kitchen EP IMPLANTABLE DEVICE  2015   defibrillator/pacemaker  .  heart catherization  2015   no blockages  . Between   right hand  . TUMOR REMOVAL  1995   right ear    Current Outpatient Prescriptions  Medication Sig Dispense Refill  . aspirin 325 MG tablet Take 325 mg by mouth daily.    . fenofibrate (TRICOR) 145 MG tablet Take 1 tablet (145 mg total) by mouth daily. 90 tablet 1  . FLUoxetine (PROZAC) 10 MG capsule Take 1 capsule (10 mg total) by mouth daily. 90 capsule 2  . ibuprofen (ADVIL,MOTRIN) 800 MG tablet Take 1 tablet (800 mg total) by mouth every 8 (eight) hours as needed for moderate pain. 30 tablet 5  . metoprolol succinate (TOPROL-XL) 25 MG 24 hr tablet TAKE 1 TABLET DAILY 30 tablet 3  . lisinopril (PRINIVIL,ZESTRIL) 20 MG tablet TAKE 1 TABLET DAILY (Patient not taking: Reported on 01/20/2017) 30 tablet 3   No current facility-administered medications for this visit.     Allergies as of 01/20/2017 - Review Complete 01/20/2017  Allergen Reaction Noted  . Coreg [carvedilol] Swelling 12/27/2015    Family History  Problem Relation Age of Onset  . Breast cancer Mother   . Depression Mother   . Diabetes Mother   . Hearing loss Mother   . Hyperlipidemia Mother   . Hypertension Mother   . Mental illness Mother   . Vision loss Mother   . Alcohol abuse Father   . Arthritis Father   .  Early death Father   . Hyperlipidemia Father   . Hypertension Father   . Vision loss Father   . Colon cancer Paternal Grandmother   . Liver cancer Cousin   . Colon polyps Neg Hx     Social History   Social History  . Marital status: Legally Separated    Spouse name: N/A  . Number of children: N/A  . Years of education: N/A   Social History Main Topics  . Smoking status: Current Every Day Smoker    Packs/day: 1.00    Years: 40.00    Types: Cigarettes  . Smokeless tobacco: Never Used  . Alcohol use No  . Drug use: No  . Sexual activity: No   Other Topics Concern  . None   Social History Narrative  . None    Review of  Systems: As noted in HPI.   Physical Exam: BP 133/72   Pulse 93   Temp 97.8 F (36.6 C) (Oral)   Ht _0  (1.727 m)   Wt 268 lb 3.2 oz (121.7 kg)   BMI 40.78 kg/m  General:   Alert and oriented. No distress noted. Pleasant and cooperative.  Head:  Normocephalic and atraumatic. Eyes:  Conjuctiva clear without scleral icterus. Mouth:  Oral mucosa pink and moist.  Heart:  S1, S2 present without murmurs, rubs, or gallops. Regular rate and rhythm. Abdomen:  +BS, soft but obese, non-tender. Large midline incisional scar well-healed. Msk:  Symmetrical without gross deformities. Normal posture. Extremities:  1-2+ pitting lower extremity edema.  Neurologic:  Alert and  oriented x4;  grossly normal neurologically. Psych:  Alert and cooperative. Normal mood and affect.  Outside labs from 09/2016 at [facility]: WBC 11.7, Hgb 8.5, Platelets 419. Sodium 133, Albumin 2.8, Alk Phos 133, AST 21, ALT 8, Tbili 0.7.

## 2017-01-21 ENCOUNTER — Telehealth: Payer: Self-pay | Admitting: Gastroenterology

## 2017-01-21 NOTE — Telephone Encounter (Signed)
Russell Trujillo: patient was to get BMP done. Can we have him do a CBC, HFP, INR as well?

## 2017-01-21 NOTE — Assessment & Plan Note (Addendum)
56 year old male with ETOH cirrhosis due for initial EGD for variceal screening. Prolonged hospitalization in December as noted for complicated ruptured umbilical hernia and purulent ascites. Has recovered well but has historically been non-compliant with dietary and diuretic recommendations. Discussed at length avoidance of soft drinks, a 2 gram sodium diet, follow-through with EGD (which he has cancelled on multiple occasions). I feel this was related to his ailing mother, who unfortunately recently passed away. Discussed absolute avoidance of ETOH as well, as he had lapsed after her death.  Check BMP, CBC, HFP, INR now. Proceed with upper endoscopy in the near future with Dr. Darrick PennaFields. The risks, benefits, and alternatives have been discussed in detail with patient. They have stated understanding and desire to proceed.  PROPOFOL US abdomen May 2018  3 month follow-up

## 2017-01-25 ENCOUNTER — Other Ambulatory Visit: Payer: Self-pay

## 2017-01-25 ENCOUNTER — Encounter (HOSPITAL_COMMUNITY)
Admission: RE | Admit: 2017-01-25 | Discharge: 2017-01-25 | Disposition: A | Discharge status: Home / Self Care | Payer: Medicaid Other | Source: Ambulatory Visit | Attending: Gastroenterology | Admitting: Gastroenterology

## 2017-01-25 ENCOUNTER — Encounter (HOSPITAL_COMMUNITY): Payer: Self-pay

## 2017-01-25 DIAGNOSIS — N179 Acute kidney failure, unspecified: Secondary | ICD-10-CM | POA: Insufficient documentation

## 2017-01-25 DIAGNOSIS — R748 Abnormal levels of other serum enzymes: Secondary | ICD-10-CM | POA: Insufficient documentation

## 2017-01-25 DIAGNOSIS — E871 Hypo-osmolality and hyponatremia: Secondary | ICD-10-CM | POA: Diagnosis not present

## 2017-01-25 DIAGNOSIS — I10 Essential (primary) hypertension: Secondary | ICD-10-CM | POA: Diagnosis not present

## 2017-01-25 DIAGNOSIS — K7460 Unspecified cirrhosis of liver: Secondary | ICD-10-CM | POA: Diagnosis not present

## 2017-01-25 DIAGNOSIS — E785 Hyperlipidemia, unspecified: Secondary | ICD-10-CM | POA: Diagnosis not present

## 2017-01-25 DIAGNOSIS — M87051 Idiopathic aseptic necrosis of right femur: Secondary | ICD-10-CM | POA: Diagnosis not present

## 2017-01-25 DIAGNOSIS — R600 Localized edema: Secondary | ICD-10-CM | POA: Diagnosis not present

## 2017-01-25 DIAGNOSIS — K439 Ventral hernia without obstruction or gangrene: Secondary | ICD-10-CM | POA: Insufficient documentation

## 2017-01-25 DIAGNOSIS — D649 Anemia, unspecified: Secondary | ICD-10-CM | POA: Diagnosis not present

## 2017-01-25 DIAGNOSIS — Z95810 Presence of automatic (implantable) cardiac defibrillator: Secondary | ICD-10-CM | POA: Insufficient documentation

## 2017-01-25 DIAGNOSIS — Z01812 Encounter for preprocedural laboratory examination: Secondary | ICD-10-CM | POA: Diagnosis not present

## 2017-01-25 HISTORY — DX: Heart failure, unspecified: I50.9

## 2017-01-25 HISTORY — DX: Anxiety disorder, unspecified: F41.9

## 2017-01-25 HISTORY — DX: Headache: R51

## 2017-01-25 HISTORY — DX: Headache, unspecified: R51.9

## 2017-01-25 HISTORY — DX: Unspecified osteoarthritis, unspecified site: M19.90

## 2017-01-25 HISTORY — DX: Dyspnea, unspecified: R06.00

## 2017-01-25 LAB — HEPATIC FUNCTION PANEL
ALBUMIN: 3.4 g/dL — AB (ref 3.5–5.0)
ALT: 31 U/L (ref 17–63)
AST: 77 U/L — ABNORMAL HIGH (ref 15–41)
Alkaline Phosphatase: 140 U/L — ABNORMAL HIGH (ref 38–126)
BILIRUBIN TOTAL: 1.1 mg/dL (ref 0.3–1.2)
Bilirubin, Direct: 0.3 mg/dL (ref 0.1–0.5)
Indirect Bilirubin: 0.8 mg/dL (ref 0.3–0.9)
Total Protein: 8.5 g/dL — ABNORMAL HIGH (ref 6.5–8.1)

## 2017-01-25 LAB — CBC
HEMATOCRIT: 34.7 % — AB (ref 39.0–52.0)
Hemoglobin: 11 g/dL — ABNORMAL LOW (ref 13.0–17.0)
MCH: 30.1 pg (ref 26.0–34.0)
MCHC: 31.7 g/dL (ref 30.0–36.0)
MCV: 95.1 fL (ref 78.0–100.0)
Platelets: 178 10*3/uL (ref 150–400)
RBC: 3.65 MIL/uL — ABNORMAL LOW (ref 4.22–5.81)
RDW: 16.1 % — AB (ref 11.5–15.5)
WBC: 8.9 10*3/uL (ref 4.0–10.5)

## 2017-01-25 LAB — BASIC METABOLIC PANEL
ANION GAP: 10 (ref 5–15)
BUN: 7 mg/dL (ref 6–20)
CALCIUM: 8.8 mg/dL — AB (ref 8.9–10.3)
CO2: 24 mmol/L (ref 22–32)
Chloride: 104 mmol/L (ref 101–111)
Creatinine, Ser: 0.82 mg/dL (ref 0.61–1.24)
GFR calc Af Amer: >60 mL/min (ref >60)
GFR calc non Af Amer: >60 mL/min (ref >60)
GLUCOSE: 91 mg/dL (ref 65–99)
POTASSIUM: 3.5 mmol/L (ref 3.5–5.1)
Sodium: 138 mmol/L (ref 135–145)

## 2017-01-25 NOTE — Progress Notes (Signed)
CC'D TO PCP °

## 2017-01-25 NOTE — Telephone Encounter (Signed)
Russell Trujillo, I could only add the LFT's. Do you want the PT and INR ordered now?

## 2017-01-25 NOTE — Telephone Encounter (Signed)
Per Tobi Bastos, CBC and BMP done today at [facility].  Try to add LFT's and PT and INR.   I called [facility] and spoke to [facility], and she said to put the order in and fax it to [phone] for the LFT's. PT/INR could not be done.

## 2017-01-25 NOTE — Patient Instructions (Signed)
    Russell Trujillo  01/25/2017     @   Your procedure is scheduled on 01/26/2017.  Report to [facility] at 6:45 A.M.  Call this number if you have problems the morning of surgery:  [phone]   Remember:  Do not eat food or drink liquids after midnight.  Take these medicines the morning of surgery with A SIP OF WATER : Prozac and Toprol   Do not wear jewelry, make-up or nail polish.  Do not wear lotions, powders, or perfumes, or deoderant.  Do not shave 48 hours prior to surgery.  Men may shave face and neck.  Do not bring valuables to the hospital.  [facility] is not responsible for any belongings or valuables.  Contacts, dentures or bridgework may not be worn into surgery.  Leave your suitcase in the car.  After surgery it may be brought to your room.  For patients admitted to the hospital, discharge time will be determined by your treatment team.  Patients discharged the day of surgery will not be allowed to drive home.   Name and phone number of your driver:   family Special instructions:  n/a  Please read over the following fact sheets that you were given. Care and Recovery After Surgery    Esophagogastroduodenoscopy, Care After Refer to this sheet in the next few weeks. These instructions provide you with information about caring for yourself after your procedure. Your health care provider may also give you more specific instructions. Your treatment has been planned according to current medical practices, but problems sometimes occur. Call your health care provider if you have any problems or questions after your procedure. What can I expect after the procedure? After the procedure, it is common to have:  A sore throat.  Nausea.  Bloating.  Dizziness.  Fatigue. Follow these instructions at home:  Do not eat or drink anything until the numbing medicine (local anesthetic) has worn off and your gag reflex has returned. You will know that the  local anesthetic has worn off when you can swallow comfortably.  Do not drive for 24 hours if you received a medicine to help you relax (sedative).  If your health care provider took a tissue sample for testing during the procedure, make sure to get your test results. This is your responsibility. Ask your health care provider or the department performing the test when your results will be ready.  Keep all follow-up visits as told by your health care provider. This is important. Contact a health care provider if:  You cannot stop coughing.  You are not urinating.  You are urinating less than usual. Get help right away if:  You have trouble swallowing.  You cannot eat or drink.  You have throat or chest pain that gets worse.  You are dizzy or light-headed.  You faint.  You have nausea or vomiting.  You have chills.  You have a fever.  You have severe abdominal pain.  You have black, tarry, or bloody stools. This information is not intended to replace advice given to you by your health care provider. Make sure you discuss any questions you have with your health care provider. Document Released: 09/28/2012 Document Revised: 03/19/2016 Document Reviewed: 09/05/2015 Elsevier Interactive Patient Education  2017 [employer].

## 2017-01-25 NOTE — Telephone Encounter (Signed)
We need an INR at some point in the future, but it's not urgent. It's best to get INR and CMP on same day to calculate MELD, so I won't be able to calculate an accurate one, but at least we can see what his INR is.

## 2017-01-26 ENCOUNTER — Ambulatory Visit: Payer: Medicaid Other | Admitting: Gastroenterology

## 2017-01-26 ENCOUNTER — Encounter (HOSPITAL_COMMUNITY)
Admission: RE | Disposition: A | Discharge status: Home / Self Care | Payer: Self-pay | Source: Ambulatory Visit | Attending: Gastroenterology

## 2017-01-26 ENCOUNTER — Encounter (HOSPITAL_COMMUNITY): Payer: Self-pay | Admitting: *Deleted

## 2017-01-26 ENCOUNTER — Ambulatory Visit (HOSPITAL_COMMUNITY): Payer: Medicaid Other | Admitting: Anesthesiology

## 2017-01-26 ENCOUNTER — Ambulatory Visit (HOSPITAL_COMMUNITY)
Admission: RE | Admit: 2017-01-26 | Discharge: 2017-01-26 | Disposition: A | Discharge status: Home / Self Care | Payer: Medicaid Other | Source: Ambulatory Visit | Attending: Gastroenterology | Admitting: Gastroenterology

## 2017-01-26 DIAGNOSIS — M1990 Unspecified osteoarthritis, unspecified site: Secondary | ICD-10-CM | POA: Diagnosis not present

## 2017-01-26 DIAGNOSIS — M879 Osteonecrosis, unspecified: Secondary | ICD-10-CM | POA: Insufficient documentation

## 2017-01-26 DIAGNOSIS — K2980 Duodenitis without bleeding: Secondary | ICD-10-CM | POA: Insufficient documentation

## 2017-01-26 DIAGNOSIS — Z833 Family history of diabetes mellitus: Secondary | ICD-10-CM | POA: Diagnosis not present

## 2017-01-26 DIAGNOSIS — F419 Anxiety disorder, unspecified: Secondary | ICD-10-CM | POA: Insufficient documentation

## 2017-01-26 DIAGNOSIS — Z95810 Presence of automatic (implantable) cardiac defibrillator: Secondary | ICD-10-CM | POA: Diagnosis not present

## 2017-01-26 DIAGNOSIS — Z7982 Long term (current) use of aspirin: Secondary | ICD-10-CM | POA: Diagnosis not present

## 2017-01-26 DIAGNOSIS — K219 Gastro-esophageal reflux disease without esophagitis: Secondary | ICD-10-CM | POA: Insufficient documentation

## 2017-01-26 DIAGNOSIS — F329 Major depressive disorder, single episode, unspecified: Secondary | ICD-10-CM | POA: Insufficient documentation

## 2017-01-26 DIAGNOSIS — Z800 Family history of malignant neoplasm of digestive organs: Secondary | ICD-10-CM | POA: Insufficient documentation

## 2017-01-26 DIAGNOSIS — F17210 Nicotine dependence, cigarettes, uncomplicated: Secondary | ICD-10-CM | POA: Insufficient documentation

## 2017-01-26 DIAGNOSIS — K2970 Gastritis, unspecified, without bleeding: Secondary | ICD-10-CM | POA: Insufficient documentation

## 2017-01-26 DIAGNOSIS — Z13810 Encounter for screening for upper gastrointestinal disorder: Secondary | ICD-10-CM | POA: Diagnosis not present

## 2017-01-26 DIAGNOSIS — K766 Portal hypertension: Secondary | ICD-10-CM | POA: Diagnosis not present

## 2017-01-26 DIAGNOSIS — I110 Hypertensive heart disease with heart failure: Secondary | ICD-10-CM | POA: Diagnosis not present

## 2017-01-26 DIAGNOSIS — Z888 Allergy status to other drugs, medicaments and biological substances status: Secondary | ICD-10-CM | POA: Diagnosis not present

## 2017-01-26 DIAGNOSIS — I8500 Esophageal varices without bleeding: Secondary | ICD-10-CM | POA: Diagnosis not present

## 2017-01-26 DIAGNOSIS — M109 Gout, unspecified: Secondary | ICD-10-CM | POA: Insufficient documentation

## 2017-01-26 DIAGNOSIS — Z803 Family history of malignant neoplasm of breast: Secondary | ICD-10-CM | POA: Insufficient documentation

## 2017-01-26 DIAGNOSIS — E785 Hyperlipidemia, unspecified: Secondary | ICD-10-CM | POA: Insufficient documentation

## 2017-01-26 DIAGNOSIS — Z8249 Family history of ischemic heart disease and other diseases of the circulatory system: Secondary | ICD-10-CM | POA: Diagnosis not present

## 2017-01-26 DIAGNOSIS — K3189 Other diseases of stomach and duodenum: Secondary | ICD-10-CM | POA: Insufficient documentation

## 2017-01-26 HISTORY — PX: ESOPHAGOGASTRODUODENOSCOPY (EGD) WITH PROPOFOL: SHX5813

## 2017-01-26 HISTORY — PX: BIOPSY: SHX5522

## 2017-01-26 SURGERY — ESOPHAGOGASTRODUODENOSCOPY (EGD) WITH PROPOFOL
Anesthesia: Monitor Anesthesia Care

## 2017-01-26 MED ORDER — LIDOCAINE VISCOUS 2 % MT SOLN
5.0000 mL | Freq: Once | OROMUCOSAL | Status: AC
  Administered 2017-01-26: 5 mL via OROMUCOSAL

## 2017-01-26 MED ORDER — CHLORHEXIDINE GLUCONATE CLOTH 2 % EX PADS: 6.0000 | MEDICATED_PAD | Freq: Once | CUTANEOUS | Status: DC

## 2017-01-26 MED ORDER — MIDAZOLAM HCL 5 MG/5ML IJ SOLN
INTRAMUSCULAR | Status: DC | PRN
  Administered 2017-01-26: 2 mg via INTRAVENOUS

## 2017-01-26 MED ORDER — IPRATROPIUM-ALBUTEROL 0.5-2.5 (3) MG/3ML IN SOLN
3.0000 mL | Freq: Once | RESPIRATORY_TRACT | Status: AC
  Administered 2017-01-26: 3 mL via RESPIRATORY_TRACT

## 2017-01-26 MED ORDER — FENTANYL CITRATE (PF) 100 MCG/2ML IJ SOLN
25.0000 ug | INTRAMUSCULAR | Status: AC
  Administered 2017-01-26 (×2): 25 ug via INTRAVENOUS

## 2017-01-26 MED ORDER — FENTANYL CITRATE (PF) 100 MCG/2ML IJ SOLN
INTRAMUSCULAR | Status: AC
  Filled 2017-01-26: qty 2

## 2017-01-26 MED ORDER — PROPOFOL 500 MG/50ML IV EMUL
INTRAVENOUS | Status: DC | PRN
  Administered 2017-01-26: 150 ug/kg/min via INTRAVENOUS
  Administered 2017-01-26: 09:00:00 via INTRAVENOUS

## 2017-01-26 MED ORDER — PANTOPRAZOLE SODIUM 40 MG PO TBEC: DELAYED_RELEASE_TABLET | ORAL | 11 refills | Status: AC

## 2017-01-26 MED ORDER — IPRATROPIUM-ALBUTEROL 0.5-2.5 (3) MG/3ML IN SOLN
RESPIRATORY_TRACT | Status: AC
  Filled 2017-01-26: qty 3

## 2017-01-26 MED ORDER — LIDOCAINE VISCOUS 2 % MT SOLN
OROMUCOSAL | Status: AC
  Filled 2017-01-26: qty 15

## 2017-01-26 MED ORDER — PROPOFOL 10 MG/ML IV BOLUS
INTRAVENOUS | Status: AC
  Filled 2017-01-26: qty 20

## 2017-01-26 MED ORDER — LACTATED RINGERS IV SOLN
INTRAVENOUS | Status: DC
  Administered 2017-01-26: 08:00:00 via INTRAVENOUS

## 2017-01-26 MED ORDER — MIDAZOLAM HCL 2 MG/2ML IJ SOLN
1.0000 mg | INTRAMUSCULAR | Status: AC
  Administered 2017-01-26: 2 mg via INTRAVENOUS
  Filled 2017-01-26: qty 2

## 2017-01-26 NOTE — Transfer of Care (Signed)
Immediate Anesthesia Transfer of Care Note  Patient: Russell Trujillo  Procedure(s) Performed: Procedure(s) with comments: ESOPHAGOGASTRODUODENOSCOPY (EGD) WITH PROPOFOL (N/A) - 815 BIOPSY - gastric biopsies for h pylori  Patient Location: PACU  Anesthesia Type:MAC  Level of Consciousness: awake, alert , oriented and patient cooperative  Airway & Oxygen Therapy: Patient Spontanous Breathing and Patient connected to nasal cannula oxygen  Post-op Assessment: Report given to RN and Post -op Vital signs reviewed and stable  Post vital signs: Reviewed and stable  Last Vitals:  Vitals:   01/26/17 0820 01/26/17 0825  BP: 133/63 135/73  Pulse:    Resp: (!) 24 (!) 23  Temp:      Last Pain:  Vitals:   01/26/17 0830  TempSrc:   PainSc: 5       Patients Stated Pain Goal: 6 (99/83/38 2505)  Complications: No apparent anesthesia complications

## 2017-01-26 NOTE — Anesthesia Postprocedure Evaluation (Signed)
Anesthesia Post Note  Patient: Russell Trujillo  Procedure(s) Performed: Procedure(s) (LRB): ESOPHAGOGASTRODUODENOSCOPY (EGD) WITH PROPOFOL (N/A) BIOPSY  Patient location during evaluation: PACU Anesthesia Type: MAC Level of consciousness: awake and alert and oriented Pain management: pain level controlled Vital Signs Assessment: post-procedure vital signs reviewed and stable Respiratory status: spontaneous breathing and patient connected to nasal cannula oxygen Cardiovascular status: stable Postop Assessment: no signs of nausea or vomiting Anesthetic complications: no     Last Vitals:  Vitals:   01/26/17 0820 01/26/17 0825  BP: 133/63 135/73  Pulse:    Resp: (!) 24 (!) 23  Temp:      Last Pain:  Vitals:   01/26/17 0830  TempSrc:   PainSc: 5                  ADAMS, AMY A

## 2017-01-26 NOTE — Discharge Instructions (Signed)
You have gastritis & DUODENITIS DUE TO YOUR USING ASPIRIN, NAPROXEN, AND IBUPROFEN. 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 I biopsied your stomach   REDUCE ASPIRIN TO 81 MG DAILY.  HOLD NAPROXEN AND IBUPROFEN FOR 7 DAYS. YOU SHOULD NOT TAKE NAPROXEN AND IBUPROFEN IN THE SAME DAY. IT CAN CAUSE INTERNAL BLEEDING AND WILL KILL YOUR KIDNEYS.  START  PROTONIX. TAKE 30 MINUTES PRIOR TO MEALS TWICE DAILY.   CONTINUE YOUR WEIGHT LOSS EFFORTS. LOSE 20 LBS.  FOLLOW A LOW FAT DIET. MEATS SHOULD BE BAKED, BROILED, OR BOILED. AVOID FIRED FOODS. SEE INFO BELOW.  YOUR BIOPSY RESULTS WILL BE AVAILABLE IN MY CHART AFTER APR 6 AND MY OFFICE WILL CONTACT YOU IN 10-14 DAYS WITH YOUR RESULTS.   FOLLOW UP IN 6 MOS.    UPPER ENDOSCOPY AFTER CARE Read the instructions outlined below and refer to this sheet in the next week. These discharge instructions provide you with general information on caring for yourself after you leave the hospital. While your treatment has been planned according to the most current medical practices available, unavoidable complications occasionally occur. If you have any problems or questions after discharge, call DR. Arianis Bowditch, [phone].  ACTIVITY  You may resume your regular activity, but move at a slower pace for the next 24 hours.   Take frequent rest periods for the next 24 hours.   Walking will help get rid of the air and reduce the bloated feeling in your belly (abdomen).   No driving for 24 hours (because of the medicine (anesthesia) used during the test).   You may shower.   Do not sign any important legal documents or operate any machinery for 24 hours (because of the anesthesia used during the test).    NUTRITION  Drink plenty of fluids.   You may resume your normal diet as instructed by your doctor.   Begin with a light meal and progress to your normal diet. Heavy or  fried foods are harder to digest and may make you feel sick to your stomach (nauseated).   Avoid alcoholic beverages for 24 hours or as instructed.    MEDICATIONS  You may resume your normal medications.   WHAT YOU CAN EXPECT TODAY  Some feelings of bloating in the abdomen.   Passage of more gas than usual.    IF YOU HAD A BIOPSY TAKEN DURING THE UPPER ENDOSCOPY:  Eat a soft diet IF YOU HAVE NAUSEA, BLOATING, ABDOMINAL PAIN, OR VOMITING.    FINDING OUT THE RESULTS OF YOUR TEST Not all test results are available during your visit. DR. Darrick Penna WILL CALL YOU WITHIN 14 DAYS OF YOUR PROCEDUE WITH YOUR RESULTS. Do not assume everything is normal if you have not heard from DR. Braydee Shimkus, CALL HER OFFICE AT [phone].  SEEK IMMEDIATE MEDICAL ATTENTION AND CALL THE OFFICE: [phone] IF:  You have more than a spotting of blood in your stool.   Your belly is swollen (abdominal distention).   You are nauseated or vomiting.   You have a temperature over 101F.   You have abdominal pain or discomfort that is severe or gets worse throughout the day.   Gastritis/DUODENITIS  Gastritis is an inflammation (the body's way of reacting to injury and/or infection) of the stomach. DUODENITIS is an inflammation (the body's way of reacting to injury and/or infection) of the FIRST PART OF THE SMALL INTESTINES. It is often caused by bacterial (germ) infections.  It can also be caused BY ASPIRIN, BC/GOODY POWDER'S, (IBUPROFEN) MOTRIN, OR ALEVE (NAPROXEN), chemicals (including alcohol), SPICY FOODS, and medications. This illness may be associated with generalized malaise (feeling tired, not well), UPPER ABDOMINAL STOMACH cramps, and fever. One common bacterial cause of gastritis is an organism known as H. Pylori. This can be treated with antibiotics.     Low-Fat Diet BREADS, CEREALS, PASTA, RICE, DRIED PEAS, AND BEANS These products are high in carbohydrates and most are low in fat. Therefore, they  can be increased in the diet as substitutes for fatty foods. They too, however, contain calories and should not be eaten in excess. Cereals can be eaten for snacks as well as for breakfast.  Include foods that contain fiber (fruits, vegetables, whole grains, and legumes). Research shows that fiber may lower blood cholesterol levels, especially the water-soluble fiber found in fruits, vegetables, oat products, and legumes. FRUITS AND VEGETABLES It is good to eat fruits and vegetables. Besides being sources of fiber, both are rich in vitamins and some minerals. They help you get the daily allowances of these nutrients. Fruits and vegetables can be used for snacks and desserts. MEATS Limit lean meat, chicken, [place], and fish to no more than 6 ounces per day. Beef, Pork, and Lamb Use lean cuts of beef, pork, and lamb. Lean cuts include:  Extra-lean ground beef.  Arm roast.  Sirloin tip.  Center-cut ham.  Round steak.  Loin chops.  Rump roast.  Tenderloin.  Trim all fat off the outside of meats before cooking. It is not necessary to severely decrease the intake of red meat, but lean choices should be made. Lean meat is rich in protein and contains a highly absorbable form of iron. Premenopausal women, in particular, should avoid reducing lean red meat because this could increase the risk for low red blood cells (iron-deficiency anemia).  Chicken and [place] These are good sources of protein. The fat of poultry can be reduced by removing the skin and underlying fat layers before cooking. Chicken and [place] can be substituted for lean red meat in the diet. Poultry should not be fried or covered with high-fat sauces. Fish and Shellfish Fish is a good source of protein. Shellfish contain cholesterol, but they usually are low in saturated fatty acids. The preparation of fish is important. Like chicken and [place], they should not be fried or covered with high-fat sauces. EGGS Egg whites contain no fat or  cholesterol. They can be eaten often. Try 1 to 2 egg whites instead of whole eggs in recipes or use egg substitutes that do not contain yolk.  MILK AND DAIRY PRODUCTS Use skim or 1% milk instead of 2% or whole milk. Decrease whole milk, natural, and processed cheeses. Use nonfat or low-fat (2%) cottage cheese or low-fat cheeses made from vegetable oils. Choose nonfat or low-fat (1 to 2%) yogurt. Experiment with evaporated skim milk in recipes that call for heavy cream. Substitute low-fat yogurt or low-fat cottage cheese for sour cream in dips and salad dressings. Have at least 2 servings of low-fat dairy products, such as 2 glasses of skim (or 1%) milk each day to help get your daily calcium intake.  FATS AND OILS Butterfat, lard, and beef fats are high in saturated fat and cholesterol. These should be avoided.Vegetable fats do not contain cholesterol. AVOID coconut oil, palm oil, and palm kernel oil, WHICH are very high in saturated fats. These should be limited. These fats are often used in [employer], processed foods,  popcorn, oils, and nondairy creamers. Vegetable shortenings and some peanut butters contain hydrogenated oils, which are also saturated fats. Read the labels on these foods and check for saturated vegetable oils.  Desirable liquid vegetable oils are corn oil, cottonseed oil, olive oil, canola oil, safflower oil, soybean oil, and sunflower oil. Peanut oil is not as good, but small amounts are acceptable. Buy a heart-healthy tub margarine that has no partially hydrogenated oils in the ingredients. AVOID Mayonnaise and salad dressings often are made from unsaturated fats.  OTHER EATING TIPS Snacks  Most sweets should be limited as snacks. They tend to be rich in calories and fats, and their caloric content outweighs their nutritional value. Some good choices in snacks are graham crackers, melba toast, soda crackers, bagels (no egg), English muffins, fruits, and vegetables. These snacks  are preferable to snack crackers, [place] fries, and chips. Popcorn should be air-popped or cooked in small amounts of liquid vegetable oil.  Desserts Eat fruit, low-fat yogurt, and fruit ices instead of pastries, cake, and cookies. Sherbet, angel food cake, gelatin dessert, frozen low-fat yogurt, or other frozen products that do not contain saturated fat (pure fruit juice bars, frozen ice pops) are also acceptable.   COOKING METHODS Choose those methods that use little or no fat. They include: Poaching.  Braising.  Steaming.  Grilling.  Baking.  Stir-frying.  Broiling.  Microwaving.  Foods can be cooked in a nonstick pan without added fat, or use a nonfat cooking spray in regular cookware. Limit fried foods and avoid frying in saturated fat. Add moisture to lean meats by using water, broth, cooking wines, and other nonfat or low-fat sauces along with the cooking methods mentioned above. Soups and stews should be chilled after cooking. The fat that forms on top after a few hours in the refrigerator should be skimmed off. When preparing meals, avoid using excess salt. Salt can contribute to raising blood pressure in some people.  EATING AWAY FROM HOME Order entres, potatoes, and vegetables without sauces or butter. When meat exceeds the size of a deck of cards (3 to 4 ounces), the rest can be taken home for another meal. Choose vegetable or fruit salads and ask for low-calorie salad dressings to be served on the side. Use dressings sparingly. Limit high-fat toppings, such as bacon, crumbled eggs, cheese, sunflower seeds, and olives. Ask for heart-healthy tub margarine instead of butter.

## 2017-01-26 NOTE — Op Note (Signed)
[facility] Patient Name: Russell Trujillo Procedure Date: 01/26/2017 8:04 AM MRN: [id number] Date of Birth: 1961/04/19 Attending MD: Jonette Eva , MD CSN: [id number] Age: 56 Admit Type: Outpatient Procedure:                Upper GI endoscopy WITH COLD FORCEPS BIOPSY Indications:              Variceal screening (no known varices or prior                            bleeding). NORMOCYTIC ANEMIA ON ASA 325 MG                            DAILY(APR 2018 PLT CT 178k) Providers:                Jonette Eva, MD, Jannett Celestine, RN, Toniann Fail                            RN, RN Referring MD:             Elige Radon. Dettinger Medicines:                Propofol per Anesthesia Complications:            No immediate complications. Estimated Blood Loss:     Estimated blood loss was minimal. Procedure:                Pre-Anesthesia Assessment:                           - Prior to the procedure, a History and Physical                            was performed, and patient medications and                            allergies were reviewed. The patient's tolerance of                            previous anesthesia was also reviewed. The risks                            and benefits of the procedure and the sedation                            options and risks were discussed with the patient.                            All questions were answered, and informed consent                            was obtained. Prior Anticoagulants: The patient has                            taken aspirin. ASA Grade Assessment: II - A patient  with mild systemic disease. After reviewing the                            risks and benefits, the patient was deemed in                            satisfactory condition to undergo the procedure.                            After obtaining informed consent, the endoscope was                            passed under direct vision. Throughout the              procedure, the patient's blood pressure, pulse, and                            oxygen saturations were monitored continuously. The                            [id number] ([id number]) scope was introduced through the                            mouth, and advanced to the second part of duodenum.                            The upper GI endoscopy was somewhat difficult due                            to excessive bleeding. Successful completion of the                            procedure was aided by controlling the bleeding.                            The patient tolerated the procedure well. Scope In: 8:45:38 AM Scope Out: 8:56:27 AM Total Procedure Duration: 0 hours 10 minutes 49 seconds  Findings:      Grade I varices were found in the distal esophagus. They were small in       size.      Moderate portal hypertensive gastropathy was found in the gastric fundus       and in the gastric body.      Diffuse moderate inflammation characterized by congestion (edema) and       erythema was found in the gastric antrum. Biopsies were taken with a       cold forceps for Helicobacter pylori testing.      Patchy moderate inflammation characterized by congestion (edema) and       erythema was found in the duodenal bulb. Impression:               - Grade I esophageal varices.                           - MODERATE Portal hypertensive gastropathy(PHG).                           -  MILD Gastritis & Duodenitis. Moderate Sedation:      Per Anesthesia Care Recommendation:           - Await pathology results.                           - Low fat diet.                           - Continue present medications. Reduce ASA to 81 mg                            daily. IT INCREASES THE LIKELIHOOD OF OCCULT BLOOD                            LOSS DUE TO PORTAL HYPERTENSIVE GASTROPATHY.                           - Await pathology results.                           - Patient has a contact number available for                             emergencies. The signs and symptoms of potential                            delayed complications were discussed with the                            patient. Return to normal activities tomorrow.                            Written discharge instructions were provided to the                            patient. Procedure Code(s):        --- Professional ---                           [phone], Esophagogastroduodenoscopy, flexible,                            transoral; with biopsy, single or multiple Diagnosis Code(s):        --- Professional ---                           I85.00, Esophageal varices without bleeding                           K76.6, Portal hypertension                           K31.89, Other diseases of stomach and duodenum                           K29.70, Gastritis, unspecified, without bleeding  K29.80, Duodenitis without bleeding                           Z13.810, Encounter for screening for upper                            gastrointestinal disorder CPT copyright 2016 American Medical Association. All rights reserved. The codes documented in this report are preliminary and upon coder review may  be revised to meet current compliance requirements. Jonette Eva, MD Jonette Eva, MD 01/26/2017 9:17:46 AM This report has been signed electronically. Number of Addenda: 0

## 2017-01-26 NOTE — H&P (Signed)
Primary Care Physician:  Nils Pyle, MD Primary Gastroenterologist:  Dr. Darrick Penna  Pre-Procedure History & Physical: HPI:  Russell Trujillo is a 56 y.o. male here for SCREENING FOR VARICES.  Past Medical History:  Diagnosis Date  . AICD (automatic cardioverter/defibrillator) present   . Allergy   . Anxiety   . Arthritis   . 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 CHF (congestive heart failure) (HCC)   . Depression   . Dyspnea   . Fistula    Bladder Fistula  . GERD (gastroesophageal reflux disease)   . Gout   . Headache   . Hepatitis    ? unsure   . Hyperlipidemia   . Hypertension     Past Surgical History:  Procedure Laterality Date  . COLONOSCOPY  Nov 2016   Dr. Rossie Muskrat at [facility]: two 6-8 mm polyps (tubular adenomas). Surveillance 2019.   Marland Kitchen EP IMPLANTABLE DEVICE  2015   defibrillator/pacemaker  . heart catherization  2015   no blockages  . HERNIA REPAIR  09/2016   Ruptured Umbilical Hernia  . TENDON REPAIR  1986   right hand  . TUMOR REMOVAL  1995   right ear    Prior to Admission medications   Medication Sig Start Date End Date Taking? Authorizing Provider  aspirin 325 MG tablet Take 325 mg by mouth daily.   Yes Historical Provider, MD  fenofibrate (TRICOR) 145 MG tablet Take 1 tablet (145 mg total) by mouth daily. 02/20/16  Yes Elige Radon Dettinger, MD  FLUoxetine (PROZAC) 10 MG capsule Take 1 capsule (10 mg total) by mouth daily. 10/28/16  Yes Elige Radon Dettinger, MD  ibuprofen (ADVIL,MOTRIN) 800 MG tablet Take 1 tablet (800 mg total) by mouth every 8 (eight) hours as needed for moderate pain. Patient taking differently: Take 800 mg by mouth daily as needed for moderate pain.  01/07/17  Yes Elige Radon Dettinger, MD  metoprolol succinate (TOPROL-XL) 25 MG 24 hr tablet TAKE 1 TABLET DAILY 08/06/16  Yes Elige Radon Dettinger, MD  naproxen sodium (ANAPROX) 220 MG tablet Take 440 mg by mouth 2 (two) times daily as needed.    Historical Provider, MD    Allergies  as of 01/20/2017 - Review Complete 01/20/2017  Allergen Reaction Noted  . Coreg [carvedilol] Swelling 12/27/2015    Family History  Problem Relation Age of Onset  . Breast cancer Mother   . Depression Mother   . Diabetes Mother   . Hearing loss Mother   . Hyperlipidemia Mother   . Hypertension Mother   . Mental illness Mother   . Vision loss Mother   . Alcohol abuse Father   . Arthritis Father   . Early death Father   . Hyperlipidemia Father   . Hypertension Father   . Vision loss Father   . Colon cancer Paternal Grandmother   . Liver cancer Cousin   . Colon polyps Neg Hx     Social History   Social History  . Marital status: Legally Separated    Spouse name: N/A  . Number of children: N/A  . Years of education: N/A   Occupational History  . Not on file.   Social History Main Topics  . Smoking status: Current Every Day Smoker    Packs/day: 1.00    Years: 40.00    Types: Cigarettes  . Smokeless tobacco: Never Used  . Alcohol use Yes     Comment: Occ  . Drug use: No  .  Sexual activity: No   Other Topics Concern  . Not on file   Social History Narrative  . No narrative on file    Review of Systems: See HPI, otherwise negative ROS   Physical Exam: BP (!) 143/75   Pulse 85   Temp 98.5 F (36.9 C) (Oral)   Resp (!) 22   Ht  (1.727 m)   Wt 269 lb (122 kg)   SpO2 94%   BMI 40.90 kg/m  General:   Alert,  pleasant and cooperative in NAD Head:  Normocephalic and atraumatic. Neck:  Supple; Lungs:  Clear throughout to auscultation.    Heart:  Regular rate and rhythm. Abdomen:  Soft, nontender and nondistended. Normal bowel sounds, without guarding, and without rebound.   Neurologic:  Alert and  oriented x4;  grossly normal neurologically.  Impression/Plan:     SCREENING VARICES  PLAN:  1.EGD TODAY. DISCUSSED PROCEDURE, BENEFITS, & RISKS: < 1% chance of medication reaction, bleeding, or perforation.ver.

## 2017-01-26 NOTE — Anesthesia Preprocedure Evaluation (Signed)
Anesthesia Evaluation  Patient identified by MRN, date of birth, ID band Patient awake    Reviewed: Allergy & Precautions, NPO status , Patient's Chart, lab work & pertinent test results  Airway Mallampati: II  TM Distance: >3 FB     Dental  (+) Poor Dentition   Pulmonary shortness of breath, Current Smoker,    breath sounds clear to auscultation       Cardiovascular hypertension, +CHF  + Cardiac Defibrillator  Rhythm:Regular Rate:Normal     Neuro/Psych  Headaches, PSYCHIATRIC DISORDERS Anxiety Depression    GI/Hepatic GERD  ,(+) Cirrhosis   ascites    ,   Endo/Other    Renal/GU      Musculoskeletal   Abdominal   Peds  Hematology  (+) anemia ,   Anesthesia Other Findings   Reproductive/Obstetrics                             Anesthesia Physical Anesthesia Plan  ASA: III  Anesthesia Plan: MAC   Post-op Pain Management:    Induction: Intravenous  Airway Management Planned: Simple Face Mask  Additional Equipment:   Intra-op Plan:   Post-operative Plan:   Informed Consent: I have reviewed the patients History and Physical, chart, labs and discussed the procedure including the risks, benefits and alternatives for the proposed anesthesia with the patient or authorized representative who has indicated his/her understanding and acceptance.     Plan Discussed with:   Anesthesia Plan Comments:         Anesthesia Quick Evaluation

## 2017-01-28 NOTE — Telephone Encounter (Signed)
When do you want me to order these?

## 2017-01-28 NOTE — Progress Notes (Signed)
APPT MADE

## 2017-01-29 ENCOUNTER — Other Ambulatory Visit: Payer: Self-pay

## 2017-01-29 DIAGNOSIS — K7460 Unspecified cirrhosis of liver: Secondary | ICD-10-CM

## 2017-01-29 NOTE — Telephone Encounter (Signed)
Let's go ahead and get INR. In the future, I will make sure both are added together.

## 2017-01-29 NOTE — Telephone Encounter (Signed)
Order entered and pt is aware. 

## 2017-02-01 ENCOUNTER — Encounter (HOSPITAL_COMMUNITY): Payer: Self-pay | Admitting: Gastroenterology

## 2017-02-02 ENCOUNTER — Ambulatory Visit: Payer: Medicaid Other | Admitting: Gastroenterology

## 2017-02-03 ENCOUNTER — Telehealth: Payer: Self-pay | Admitting: Gastroenterology

## 2017-02-03 NOTE — Telephone Encounter (Signed)
PLEASE CALL PT. HIS BIOPSY SHOWS Changes consistent with PORTAL HYPERTENSIVE GASTROPATHY DUE TO CIRRHOSIS. IT CAN LEAD TO BLOOD LOSS.    REDUCE ASPIRIN TO 81 MG DAILY. HOLD NAPROXEN AND IBUPROFEN FOR 7 DAYS. YOU SHOULD NOT TAKE NAPROXEN AND IBUPROFEN IN THE SAME DAY. IT CAN CAUSE INTERNAL BLEEDING AND WILL KILL YOUR KIDNEYS. START  PROTONIX. TAKE 30 MINUTES PRIOR TO MEALS TWICE DAILY. CONTINUE YOUR WEIGHT LOSS EFFORTS. LOSE 20 LBS. FOLLOW A LOW FAT/HIGH FIBER  DIET. MEATS SHOULD BE BAKED, BROILED, OR BOILED. AVOID FRIED FOODS.  FOLLOW UP IN 6 MOS.

## 2017-02-04 NOTE — Telephone Encounter (Signed)
LMOM that I have a very important message for him. Mailed a letter to call and also mailed diets to him.

## 2017-02-04 NOTE — Telephone Encounter (Signed)
APPT MADE

## 2017-02-11 NOTE — Telephone Encounter (Signed)
PT called and is aware of his results.

## 2017-05-28 ENCOUNTER — Telehealth: Payer: Self-pay | Admitting: Gastroenterology

## 2017-05-28 NOTE — Telephone Encounter (Signed)
Pt called asking to speak to the nurse. He said he had something going on and needed her advice. I transferred call to DS VM.

## 2017-05-31 ENCOUNTER — Encounter (HOSPITAL_COMMUNITY): Payer: Self-pay | Admitting: Emergency Medicine

## 2017-05-31 ENCOUNTER — Emergency Department (HOSPITAL_COMMUNITY): Payer: Medicaid Other

## 2017-05-31 ENCOUNTER — Inpatient Hospital Stay (HOSPITAL_COMMUNITY)
Admission: EM | Admit: 2017-05-31 | Discharge: 2017-06-26 | DRG: 871 | Disposition: E | Payer: Medicaid Other | Attending: Internal Medicine | Admitting: Internal Medicine

## 2017-05-31 DIAGNOSIS — D638 Anemia in other chronic diseases classified elsewhere: Secondary | ICD-10-CM | POA: Diagnosis present

## 2017-05-31 DIAGNOSIS — Z95810 Presence of automatic (implantable) cardiac defibrillator: Secondary | ICD-10-CM

## 2017-05-31 DIAGNOSIS — K7010 Alcoholic hepatitis without ascites: Secondary | ICD-10-CM | POA: Diagnosis not present

## 2017-05-31 DIAGNOSIS — N179 Acute kidney failure, unspecified: Secondary | ICD-10-CM | POA: Diagnosis present

## 2017-05-31 DIAGNOSIS — K7469 Other cirrhosis of liver: Secondary | ICD-10-CM

## 2017-05-31 DIAGNOSIS — I10 Essential (primary) hypertension: Secondary | ICD-10-CM | POA: Diagnosis not present

## 2017-05-31 DIAGNOSIS — Z800 Family history of malignant neoplasm of digestive organs: Secondary | ICD-10-CM

## 2017-05-31 DIAGNOSIS — K7011 Alcoholic hepatitis with ascites: Secondary | ICD-10-CM | POA: Diagnosis present

## 2017-05-31 DIAGNOSIS — Z1611 Resistance to penicillins: Secondary | ICD-10-CM | POA: Diagnosis present

## 2017-05-31 DIAGNOSIS — Z803 Family history of malignant neoplasm of breast: Secondary | ICD-10-CM

## 2017-05-31 DIAGNOSIS — Z7189 Other specified counseling: Secondary | ICD-10-CM | POA: Diagnosis not present

## 2017-05-31 DIAGNOSIS — R188 Other ascites: Secondary | ICD-10-CM

## 2017-05-31 DIAGNOSIS — Z66 Do not resuscitate: Secondary | ICD-10-CM | POA: Diagnosis not present

## 2017-05-31 DIAGNOSIS — F329 Major depressive disorder, single episode, unspecified: Secondary | ICD-10-CM | POA: Diagnosis present

## 2017-05-31 DIAGNOSIS — E876 Hypokalemia: Secondary | ICD-10-CM | POA: Diagnosis present

## 2017-05-31 DIAGNOSIS — K7460 Unspecified cirrhosis of liver: Secondary | ICD-10-CM

## 2017-05-31 DIAGNOSIS — J9601 Acute respiratory failure with hypoxia: Secondary | ICD-10-CM | POA: Diagnosis present

## 2017-05-31 DIAGNOSIS — K766 Portal hypertension: Secondary | ICD-10-CM | POA: Diagnosis present

## 2017-05-31 DIAGNOSIS — K922 Gastrointestinal hemorrhage, unspecified: Secondary | ICD-10-CM | POA: Diagnosis present

## 2017-05-31 DIAGNOSIS — N321 Vesicointestinal fistula: Secondary | ICD-10-CM | POA: Diagnosis present

## 2017-05-31 DIAGNOSIS — R32 Unspecified urinary incontinence: Secondary | ICD-10-CM | POA: Diagnosis present

## 2017-05-31 DIAGNOSIS — Z515 Encounter for palliative care: Secondary | ICD-10-CM | POA: Diagnosis not present

## 2017-05-31 DIAGNOSIS — K7031 Alcoholic cirrhosis of liver with ascites: Secondary | ICD-10-CM | POA: Diagnosis present

## 2017-05-31 DIAGNOSIS — K219 Gastro-esophageal reflux disease without esophagitis: Secondary | ICD-10-CM | POA: Diagnosis present

## 2017-05-31 DIAGNOSIS — N390 Urinary tract infection, site not specified: Secondary | ICD-10-CM | POA: Diagnosis present

## 2017-05-31 DIAGNOSIS — K2970 Gastritis, unspecified, without bleeding: Secondary | ICD-10-CM | POA: Diagnosis present

## 2017-05-31 DIAGNOSIS — R17 Unspecified jaundice: Secondary | ICD-10-CM | POA: Insufficient documentation

## 2017-05-31 DIAGNOSIS — B377 Candidal sepsis: Principal | ICD-10-CM | POA: Diagnosis present

## 2017-05-31 DIAGNOSIS — Z8349 Family history of other endocrine, nutritional and metabolic diseases: Secondary | ICD-10-CM

## 2017-05-31 DIAGNOSIS — K7290 Hepatic failure, unspecified without coma: Secondary | ICD-10-CM | POA: Diagnosis present

## 2017-05-31 DIAGNOSIS — R601 Generalized edema: Secondary | ICD-10-CM | POA: Insufficient documentation

## 2017-05-31 DIAGNOSIS — K2980 Duodenitis without bleeding: Secondary | ICD-10-CM | POA: Diagnosis present

## 2017-05-31 DIAGNOSIS — F10231 Alcohol dependence with withdrawal delirium: Secondary | ICD-10-CM | POA: Diagnosis present

## 2017-05-31 DIAGNOSIS — D696 Thrombocytopenia, unspecified: Secondary | ICD-10-CM | POA: Diagnosis present

## 2017-05-31 DIAGNOSIS — Z8261 Family history of arthritis: Secondary | ICD-10-CM

## 2017-05-31 DIAGNOSIS — I110 Hypertensive heart disease with heart failure: Secondary | ICD-10-CM | POA: Diagnosis present

## 2017-05-31 DIAGNOSIS — Z79899 Other long term (current) drug therapy: Secondary | ICD-10-CM

## 2017-05-31 DIAGNOSIS — F17210 Nicotine dependence, cigarettes, uncomplicated: Secondary | ICD-10-CM | POA: Diagnosis present

## 2017-05-31 DIAGNOSIS — Z9889 Other specified postprocedural states: Secondary | ICD-10-CM | POA: Diagnosis not present

## 2017-05-31 DIAGNOSIS — K7030 Alcoholic cirrhosis of liver without ascites: Secondary | ICD-10-CM | POA: Diagnosis not present

## 2017-05-31 DIAGNOSIS — B961 Klebsiella pneumoniae [K. pneumoniae] as the cause of diseases classified elsewhere: Secondary | ICD-10-CM | POA: Diagnosis present

## 2017-05-31 DIAGNOSIS — R0989 Other specified symptoms and signs involving the circulatory and respiratory systems: Secondary | ICD-10-CM | POA: Diagnosis present

## 2017-05-31 DIAGNOSIS — J90 Pleural effusion, not elsewhere classified: Secondary | ICD-10-CM | POA: Diagnosis present

## 2017-05-31 DIAGNOSIS — D649 Anemia, unspecified: Secondary | ICD-10-CM

## 2017-05-31 DIAGNOSIS — Z6841 Body Mass Index (BMI) 40.0 and over, adult: Secondary | ICD-10-CM

## 2017-05-31 DIAGNOSIS — K439 Ventral hernia without obstruction or gangrene: Secondary | ICD-10-CM | POA: Diagnosis present

## 2017-05-31 DIAGNOSIS — I8500 Esophageal varices without bleeding: Secondary | ICD-10-CM | POA: Diagnosis present

## 2017-05-31 DIAGNOSIS — Z9114 Patient's other noncompliance with medication regimen: Secondary | ICD-10-CM

## 2017-05-31 DIAGNOSIS — E871 Hypo-osmolality and hyponatremia: Secondary | ICD-10-CM | POA: Diagnosis present

## 2017-05-31 DIAGNOSIS — J9600 Acute respiratory failure, unspecified whether with hypoxia or hypercapnia: Secondary | ICD-10-CM | POA: Diagnosis not present

## 2017-05-31 DIAGNOSIS — R739 Hyperglycemia, unspecified: Secondary | ICD-10-CM | POA: Diagnosis present

## 2017-05-31 DIAGNOSIS — Z7982 Long term (current) use of aspirin: Secondary | ICD-10-CM

## 2017-05-31 DIAGNOSIS — Z833 Family history of diabetes mellitus: Secondary | ICD-10-CM

## 2017-05-31 DIAGNOSIS — I509 Heart failure, unspecified: Secondary | ICD-10-CM | POA: Diagnosis present

## 2017-05-31 DIAGNOSIS — D689 Coagulation defect, unspecified: Secondary | ICD-10-CM | POA: Diagnosis present

## 2017-05-31 DIAGNOSIS — F1010 Alcohol abuse, uncomplicated: Secondary | ICD-10-CM | POA: Diagnosis present

## 2017-05-31 DIAGNOSIS — I959 Hypotension, unspecified: Secondary | ICD-10-CM | POA: Diagnosis present

## 2017-05-31 DIAGNOSIS — R195 Other fecal abnormalities: Secondary | ICD-10-CM | POA: Diagnosis not present

## 2017-05-31 DIAGNOSIS — R0602 Shortness of breath: Secondary | ICD-10-CM

## 2017-05-31 DIAGNOSIS — Z8249 Family history of ischemic heart disease and other diseases of the circulatory system: Secondary | ICD-10-CM

## 2017-05-31 HISTORY — DX: Alcohol abuse, uncomplicated: F10.10

## 2017-05-31 HISTORY — DX: Esophageal varices without bleeding: I85.00

## 2017-05-31 HISTORY — DX: Gastroduodenitis, unspecified, without bleeding: K29.90

## 2017-05-31 HISTORY — DX: Unspecified cirrhosis of liver: K74.60

## 2017-05-31 LAB — CBC WITH DIFFERENTIAL/PLATELET
Basophils Absolute: 0.1 10*3/uL (ref 0.0–0.1)
Basophils Relative: 1 %
EOS ABS: 0.2 10*3/uL (ref 0.0–0.7)
Eosinophils Relative: 2 %
HEMATOCRIT: 23.8 % — AB (ref 39.0–52.0)
HEMOGLOBIN: 8.4 g/dL — AB (ref 13.0–17.0)
LYMPHS ABS: 1 10*3/uL (ref 0.7–4.0)
Lymphocytes Relative: 9 %
MCH: 33.5 pg (ref 26.0–34.0)
MCHC: 35.3 g/dL (ref 30.0–36.0)
MCV: 94.8 fL (ref 78.0–100.0)
MONOS PCT: 11 %
Monocytes Absolute: 1.3 10*3/uL — ABNORMAL HIGH (ref 0.1–1.0)
NEUTROS PCT: 78 %
Neutro Abs: 8.6 10*3/uL — ABNORMAL HIGH (ref 1.7–7.7)
Platelets: 145 10*3/uL — ABNORMAL LOW (ref 150–400)
RBC: 2.51 MIL/uL — ABNORMAL LOW (ref 4.22–5.81)
RDW: 24.8 % — ABNORMAL HIGH (ref 11.5–15.5)
WBC: 11.1 10*3/uL — ABNORMAL HIGH (ref 4.0–10.5)

## 2017-05-31 LAB — PREPARE RBC (CROSSMATCH)

## 2017-05-31 LAB — COMPREHENSIVE METABOLIC PANEL
ALT: 34 U/L (ref 17–63)
AST: 121 U/L — AB (ref 15–41)
Albumin: 2.1 g/dL — ABNORMAL LOW (ref 3.5–5.0)
Alkaline Phosphatase: 138 U/L — ABNORMAL HIGH (ref 38–126)
Anion gap: 14 (ref 5–15)
BUN: 9 mg/dL (ref 6–20)
CHLORIDE: 93 mmol/L — AB (ref 101–111)
CO2: 22 mmol/L (ref 22–32)
CREATININE: 1.28 mg/dL — AB (ref 0.61–1.24)
Calcium: 8.3 mg/dL — ABNORMAL LOW (ref 8.9–10.3)
GFR calc non Af Amer: >60 mL/min (ref >60)
Glucose, Bld: 87 mg/dL (ref 65–99)
POTASSIUM: 2.5 mmol/L — AB (ref 3.5–5.1)
Sodium: 129 mmol/L — ABNORMAL LOW (ref 135–145)
Total Bilirubin: 14.9 mg/dL — ABNORMAL HIGH (ref 0.3–1.2)
Total Protein: 7.9 g/dL (ref 6.5–8.1)

## 2017-05-31 LAB — URINALYSIS, ROUTINE W REFLEX MICROSCOPIC
Glucose, UA: NEGATIVE mg/dL
Ketones, ur: NEGATIVE mg/dL
Nitrite: NEGATIVE
PH: 5 (ref 5.0–8.0)
Protein, ur: 100 mg/dL — AB
SPECIFIC GRAVITY, URINE: 1.016 (ref 1.005–1.030)

## 2017-05-31 LAB — GLUCOSE, CAPILLARY: GLUCOSE-CAPILLARY: 115 mg/dL — AB (ref 65–99)

## 2017-05-31 LAB — TROPONIN I

## 2017-05-31 LAB — AMMONIA: AMMONIA: 55 umol/L — AB (ref 9–35)

## 2017-05-31 LAB — MAGNESIUM: MAGNESIUM: 1 mg/dL — AB (ref 1.7–2.4)

## 2017-05-31 LAB — BRAIN NATRIURETIC PEPTIDE: B NATRIURETIC PEPTIDE 5: 199 pg/mL — AB (ref 0.0–100.0)

## 2017-05-31 LAB — POC OCCULT BLOOD, ED: FECAL OCCULT BLD: POSITIVE — AB

## 2017-05-31 LAB — ABO/RH: ABO/RH(D): A POS

## 2017-05-31 LAB — PROTIME-INR
INR: 2.01
Prothrombin Time: 23.1 seconds — ABNORMAL HIGH (ref 11.4–15.2)

## 2017-05-31 LAB — ETHANOL: ALCOHOL ETHYL (B): 44 mg/dL — AB (ref <5)

## 2017-05-31 MED ORDER — THIAMINE HCL 100 MG/ML IJ SOLN
Freq: Once | INTRAVENOUS | Status: AC
  Administered 2017-05-31: 20:00:00 via INTRAVENOUS
  Filled 2017-05-31: qty 1000

## 2017-05-31 MED ORDER — VANCOMYCIN HCL IN DEXTROSE 1-5 GM/200ML-% IV SOLN
1000.0000 mg | Freq: Once | INTRAVENOUS | Status: AC
  Administered 2017-05-31: 1000 mg via INTRAVENOUS
  Filled 2017-05-31: qty 200

## 2017-05-31 MED ORDER — SPIRONOLACTONE 25 MG PO TABS
12.5000 mg | ORAL_TABLET | Freq: Every day | ORAL | Status: DC
  Administered 2017-05-31 – 2017-06-01 (×2): 12.5 mg via ORAL
  Filled 2017-05-31 (×6): qty 1

## 2017-05-31 MED ORDER — SODIUM CHLORIDE 0.9 % IV SOLN
INTRAVENOUS | Status: DC
  Administered 2017-05-31 – 2017-06-01 (×3): via INTRAVENOUS

## 2017-05-31 MED ORDER — FUROSEMIDE 40 MG PO TABS
40.0000 mg | ORAL_TABLET | Freq: Two times a day (BID) | ORAL | Status: DC
  Administered 2017-05-31 – 2017-06-01 (×2): 40 mg via ORAL
  Filled 2017-05-31 (×2): qty 1

## 2017-05-31 MED ORDER — PANTOPRAZOLE SODIUM 40 MG IV SOLR: 40.0000 mg | Freq: Two times a day (BID) | INTRAVENOUS | Status: DC

## 2017-05-31 MED ORDER — VANCOMYCIN HCL IN DEXTROSE 1-5 GM/200ML-% IV SOLN
1000.0000 mg | Freq: Two times a day (BID) | INTRAVENOUS | Status: DC
  Administered 2017-06-01 – 2017-06-02 (×4): 1000 mg via INTRAVENOUS
  Filled 2017-05-31 (×4): qty 200

## 2017-05-31 MED ORDER — NICOTINE 14 MG/24HR TD PT24
14.0000 mg | MEDICATED_PATCH | Freq: Every day | TRANSDERMAL | Status: DC
  Administered 2017-05-31 – 2017-06-02 (×3): 14 mg via TRANSDERMAL
  Filled 2017-05-31 (×3): qty 1

## 2017-05-31 MED ORDER — PIPERACILLIN-TAZOBACTAM 3.375 G IVPB 30 MIN
3.3750 g | Freq: Once | INTRAVENOUS | Status: AC
  Administered 2017-05-31: 3.375 g via INTRAVENOUS
  Filled 2017-05-31 (×2): qty 50

## 2017-05-31 MED ORDER — SODIUM CHLORIDE 0.9 % IV SOLN
Freq: Once | INTRAVENOUS | Status: AC
  Administered 2017-05-31: 20:00:00 via INTRAVENOUS

## 2017-05-31 MED ORDER — SODIUM CHLORIDE 0.9 % IV SOLN
80.0000 mg | Freq: Once | INTRAVENOUS | Status: AC
  Administered 2017-05-31: 80 mg via INTRAVENOUS
  Filled 2017-05-31: qty 80

## 2017-05-31 MED ORDER — POTASSIUM CHLORIDE 10 MEQ/100ML IV SOLN
10.0000 meq | Freq: Once | INTRAVENOUS | Status: AC
  Administered 2017-05-31: 10 meq via INTRAVENOUS
  Filled 2017-05-31: qty 100

## 2017-05-31 MED ORDER — PANTOPRAZOLE SODIUM 40 MG IV SOLR
INTRAVENOUS | Status: AC
  Filled 2017-05-31: qty 160

## 2017-05-31 MED ORDER — SODIUM CHLORIDE 0.9 % IV SOLN
8.0000 mg/h | INTRAVENOUS | Status: DC
  Administered 2017-05-31 – 2017-06-01 (×2): 8 mg/h via INTRAVENOUS
  Filled 2017-05-31 (×5): qty 80

## 2017-05-31 MED ORDER — PIPERACILLIN-TAZOBACTAM 3.375 G IVPB
3.3750 g | Freq: Three times a day (TID) | INTRAVENOUS | Status: DC
  Administered 2017-06-01 – 2017-06-04 (×10): 3.375 g via INTRAVENOUS
  Filled 2017-05-31 (×9): qty 50

## 2017-05-31 MED ORDER — ZOLPIDEM TARTRATE 5 MG PO TABS
5.0000 mg | ORAL_TABLET | Freq: Once | ORAL | Status: AC
  Administered 2017-05-31: 5 mg via ORAL
  Filled 2017-05-31: qty 1

## 2017-05-31 NOTE — Progress Notes (Signed)
Pharmacy Antibiotic Note  Russell Trujillo is a 56 y.o. male admitted on 06/14/2017 with sepsis.  Pharmacy has been consulted for Vancomycin and Zosyn dosing.  Plan: Vancomycin 2gm load. Vancomycin 1000mg  IV every 12 hours.  Goal trough 15-20 mcg/mL. Zosyn 3.375g IV q8h (4 hour infusion).  Monitor labs, micro and vitals.   Height: 5\' 8"  (172.7 cm) Weight: 269 lb (122 kg) IBW/kg (Calculated) : 68.4  Temp (24hrs), Avg:98.4 F (36.9 C), Min:98.4 F (36.9 C), Max:98.4 F (36.9 C)   Recent Labs Lab 06/15/2017 1611  WBC 11.1*  CREATININE 1.28*    Estimated Creatinine Clearance: 81.8 mL/min (A) (by C-G formula based on SCr of 1.28 mg/dL (H)).    Allergies  Allergen Reactions  . Coreg [Carvedilol] Swelling    Swelling of lips   Antimicrobials this admission: Vanc 8/6 >>  Zosyn 8/6 >>   Dose adjustments this admission: Obesity/Normalized CrCl Vancomycin dosing protocol will be initiated with an estimated normalized CrCl = 65.306 ml/min.   Microbiology results:  BCx:  8/6 UCx: pending   Sputum:    MRSA PCR:   Thank you for allowing pharmacy to be a part of this patient's care.  Mady GemmaHayes, Latoy Labriola R 06/17/2017 7:10 PM

## 2017-05-31 NOTE — ED Notes (Signed)
Blood consent signed by patient at this time. 

## 2017-05-31 NOTE — Telephone Encounter (Signed)
Agree with ED recommendations.
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
## 2017-05-31 NOTE — ED Notes (Signed)
Patient transported to X-ray 

## 2017-05-31 NOTE — ED Triage Notes (Signed)
Pt c/o increasing cob and ble and abd swelling x 1 week.

## 2017-05-31 NOTE — ED Provider Notes (Signed)
[facility] Provider Note   CSN: [id number] Arrival date & time: 06/16/2017  1533     History   Chief Complaint Chief Complaint  Patient presents with  . Shortness of Breath    HPI Russell Trujillo is a 56 y.o. male.   Shortness of Breath     Pt was seen at 1540.  Per pt, c/o gradual onset and worsening of persistent bilat lower extremity edema for the past 1 week. Has been associated with SOB. Pt states he has been sleeping sitting up for the past several days due to increasing SOB. Pt states he has also felt "confused" and his "eyes are turning yellow." Pt endorses one episode of N/V followed by nosebleed 2 days ago.  States he has "black stools for weeks now." Denies falls, no syncope, no neck or back pain, no focal motor weakness, no tingling/numbness in extremities, no CP/palpitations, no SOB/cough, no abd pain, no diarrhea, no blood in stools, no black emesis, no fevers.    Past Medical History:  Diagnosis Date  . AICD (automatic cardioverter/defibrillator) present   . Allergy   . Anxiety   . Arthritis   . 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 CHF (congestive heart failure) (HCC)   . Cirrhosis (HCC)   . Depression   . Dyspnea   . Esophageal varices (HCC)   . Fistula    Bladder Fistula  . GERD (gastroesophageal reflux disease)   . Gout   . Headache   . Hepatitis    ? unsure   . Hyperlipidemia   . Hypertension     Patient Active Problem List   Diagnosis Date Noted  . Esophageal varices without bleeding (HCC)   . AKI (acute kidney injury) (HCC) 09/24/2016  . Elevated troponin 09/24/2016  . Anemia 09/24/2016  . Avascular necrosis of bone of right hip (HCC) 09/24/2016  . Hyponatremia 09/24/2016  . Edema of left lower extremity 03/02/2016  . Hepatic cirrhosis (HCC) 01/28/2016  . 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 Essential hypertension, benign 12/27/2015  . Hyperlipidemia LDL goal <130 12/27/2015  . AICD (automatic cardioverter/defibrillator)  present 12/27/2015  . Ventral hernia 12/27/2015    Past Surgical History:  Procedure Laterality Date  . BIOPSY  01/26/2017   Procedure: BIOPSY;  Surgeon: West Bali, MD;  Location: [facility];  Service: Endoscopy;;  gastric biopsies for h pylori  . COLONOSCOPY  Nov 2016   Dr. Rossie Muskrat at [facility]: two 6-8 mm polyps (tubular adenomas). Surveillance 2019.   Marland Kitchen EP IMPLANTABLE DEVICE  2015   defibrillator/pacemaker  . ESOPHAGOGASTRODUODENOSCOPY (EGD) WITH PROPOFOL N/A 01/26/2017   Procedure: ESOPHAGOGASTRODUODENOSCOPY (EGD) WITH PROPOFOL;  Surgeon: West Bali, MD;  Location: [facility];  Service: Endoscopy;  Laterality: N/A;  815  . heart catherization  2015   no blockages  . HERNIA REPAIR  09/2016   Ruptured Umbilical Hernia  . TENDON REPAIR  1986   right hand  . TUMOR REMOVAL  1995   right ear       Home Medications    Prior to Admission medications   Medication Sig Start Date End Date Taking? Authorizing Provider  aspirin EC 81 MG tablet Take 81 mg by mouth daily.   Yes [provider]  fenofibrate (TRICOR) 145 MG tablet Take 1 tablet (145 mg total) by mouth daily. 02/20/16  Yes Dettinger, Elige Radon, MD  FLUoxetine (PROZAC) 10 MG capsule Take 1 capsule (10 mg total) by mouth daily.  10/28/16  Yes Dettinger, Elige Radon, MD  metoprolol succinate (TOPROL-XL) 25 MG 24 hr tablet TAKE 1 TABLET DAILY 08/06/16  Yes Dettinger, Elige Radon, MD  pantoprazole (PROTONIX) 40 MG tablet 1 PO 30 MINUTES PRIOR TO MEALS BID FOR 3 MOS THEN QD Patient taking differently: Take 40 mg by mouth 2 (two) times daily.  01/26/17  Yes Fields, Darleene Cleaver, MD    Family History Family History  Problem Relation Age of Onset  . Breast cancer Mother   . Depression Mother   . Diabetes Mother   . Hearing loss Mother   . Hyperlipidemia Mother   . Hypertension Mother   . Mental illness Mother   . Vision loss Mother   . Alcohol abuse Father   . Arthritis Father   . Early death Father   . Hyperlipidemia  Father   . Hypertension Father   . Vision loss Father   . Colon cancer Paternal Grandmother   . Liver cancer Cousin   . Colon polyps Neg Hx     Social History Social History  Substance Use Topics  . Smoking status: Current Every Day Smoker    Packs/day: 1.00    Years: 40.00    Types: Cigarettes  . Smokeless tobacco: Never Used  . Alcohol use Yes     Comment: occ     Allergies   Coreg [carvedilol]   Review of Systems Review of Systems  Respiratory: Positive for shortness of breath.   ROS: Statement: All systems negative except as marked or noted in the HPI; Constitutional: Negative for fever and chills. ; ; Eyes: Negative for eye pain, redness and discharge. ; ; ENMT: +nosebleed. Negative for ear pain, hoarseness, nasal congestion, sinus pressure and sore throat. ; ; Cardiovascular: Negative for chest pain, palpitations, diaphoresis, +dyspnea and peripheral edema. ; ; Respiratory: Negative for cough, wheezing and stridor. ; ; Gastrointestinal: +"turning yellow," N/V, +"black stools for weeks."  Negative for diarrhea, abdominal pain, blood in stool, hematemesis, jaundice and rectal bleeding. . ; ; Genitourinary: Negative for dysuria, flank pain and hematuria. ; ; Musculoskeletal: Negative for back pain and neck pain. Negative for swelling and trauma.; ; Skin: Negative for pruritus, rash, abrasions, blisters, bruising and skin lesion.; ; Neuro: +"confusion." Negative for headache, lightheadedness and neck stiffness. Negative for weakness, altered level of consciousness, extremity weakness, paresthesias, involuntary movement, seizure and syncope.      Physical Exam Updated Vital Signs Pulse 91   Temp 98.4 F (36.9 C)   Resp (!) 27   Ht 5\' 8"  (1.727 m)   Wt 122 kg (269 lb)   SpO2 97%   BMI 40.90 kg/m   16:14:06 Orthostatic Vital Signs JV  Orthostatic Lying   BP- Lying: 114/52  Pulse- Lying: 86      Orthostatic Sitting  BP- Sitting: 111/61  Pulse- Sitting: 90        Orthostatic Standing at 0 minutes  BP- Standing at 0 minutes: 101/54  Pulse- Standing at 0 minutes: 89   Patient Vitals for the past 24 hrs:  BP Temp Pulse Resp SpO2 Height Weight  06/16/17 1814 (!) 116/56 - 73 18 97 % - -  2017-06-16 1730 - - 87 16 97 % - -  06/16/2017 1700 (!) 100/47 - 87 16 96 % - -  2017/06/16 1630 (!) 103/53 - 87 (!) 26 97 % - -  2017/06/16 1615 (!) 101/54 - 89 (!) 24 98 % - -  06/16/17 1545 - - 89 19  97 % - -  2017/06/06 1532 - 98.4 F (36.9 C) 91 (!) 27 97 % - -  06-06-17 1529 - - - - - 5\' 8"  (1.727 m) 122 kg (269 lb)     Physical Exam 1545: Physical examination:  Nursing notes reviewed; Vital signs and O2 SAT reviewed;  Constitutional: Well developed, Well nourished, Well hydrated, In no acute distress; Head:  Normocephalic, atraumatic; Eyes: EOMI, PERRL, +scleral icterus; ENMT: Mouth and pharynx normal, Mucous membranes moist. +edemetous nasal turbinates bilat with dried blood in nares. No active bleeding in nose. No blood in posterior pharynx..; Neck: Supple, Full range of motion, No lymphadenopathy; Cardiovascular: Regular rate and rhythm, No gallop; Respiratory: Breath sounds clear & equal bilaterally, No wheezes.  Speaking full sentences with ease, Normal respiratory effort/excursion; Chest: Nontender, Movement normal; Abdomen: Soft, Nontender, Nondistended, Normal bowel sounds. Rectal exam performed w/permission of pt and ED RN chaperone present.  Anal tone normal.  Non-tender, soft brown stool in rectal vault, heme positive.  No fissures, no external hemorrhoids, no palp masses.;; Genitourinary: No CVA tenderness; Extremities: Pulses normal, No tenderness, +2 pedal edema feet to thighs. No calf asymmetry.; Neuro: AA&Ox3, Major CN grossly intact.  Speech clear. No gross focal motor or sensory deficits in extremities.; Skin: Color jaundiced, Warm, Dry.   ED Treatments / Results  Labs (all labs ordered are listed, but only abnormal results are displayed)   EKG  EKG  Interpretation  Date/Time:  Monday 06-Jun-2017 15:34:58 EDT Ventricular Rate:  90 PR Interval:    QRS Duration: 108 QT Interval:  407 QTC Calculation: 498 R Axis:   -71 Text Interpretation:  Atrial-sensed ventricular-paced rhythm No further analysis attempted due to paced rhythm When compared with ECG of 09/24/2016 No significant change was found Confirmed by Samuel Jester [id number]) on 06/06/2017 4:04:23 PM       Radiology   Procedures Procedures (including critical care time)  Medications Ordered in ED Medications - No data to display   Initial Impression / Assessment and Plan / ED Course  I have reviewed the triage vital signs and the nursing notes.  Pertinent labs & imaging results that were available during my care of the patient were reviewed by me and considered in my medical decision making (see chart for details).  MDM Reviewed: previous chart, nursing note and vitals Reviewed previous: labs and ECG Interpretation: labs, ECG, x-ray and CT scan Total time providing critical care: 30-74 minutes. This excludes time spent performing separately reportable procedures and services. Consults: admitting MD and gastrointestinal   CRITICAL CARE Performed by: Laray Anger Total critical care time: 35 minutes Critical care time was exclusive of separately billable procedures and treating other patients. Critical care was necessary to treat or prevent imminent or life-threatening deterioration. Critical care was time spent personally by me on the following activities: development of treatment plan with patient and/or surrogate as well as nursing, discussions with consultants, evaluation of patient's response to treatment, examination of patient, obtaining history from patient or surrogate, ordering and performing treatments and interventions, ordering and review of laboratory studies, ordering and review of radiographic studies, pulse oximetry and re-evaluation of  patient's condition.  Results for orders placed or performed during the hospital encounter of June 06, 2017  CBC with Differential  Result Value Ref Range   WBC 11.1 (H) 4.0 - 10.5 K/uL   RBC 2.51 (L) 4.22 - 5.81 MIL/uL   Hemoglobin 8.4 (L) 13.0 - 17.0 g/dL   HCT 60.4 (L) 54.0 - 98.1 %  MCV 94.8 78.0 - 100.0 fL   MCH 33.5 26.0 - 34.0 pg   MCHC 35.3 30.0 - 36.0 g/dL   RDW 96.024.8 (H) 45.411.5 - 09.815.5 %   Platelets 145 (L) 150 - 400 K/uL   Neutrophils Relative % 78 %   Neutro Abs 8.6 (H) 1.7 - 7.7 K/uL   Lymphocytes Relative 9 %   Lymphs Abs 1.0 0.7 - 4.0 K/uL   Monocytes Relative 11 %   Monocytes Absolute 1.3 (H) 0.1 - 1.0 K/uL   Eosinophils Relative 2 %   Eosinophils Absolute 0.2 0.0 - 0.7 K/uL   Basophils Relative 1 %   Basophils Absolute 0.1 0.0 - 0.1 K/uL  Protime-INR  Result Value Ref Range   Prothrombin Time 23.1 (H) 11.4 - 15.2 seconds   INR 2.01   Troponin I  Result Value Ref Range   Troponin I <0.03 <0.03 ng/mL  Brain natriuretic peptide  Result Value Ref Range   B Natriuretic Peptide 199.0 (H) 0.0 - 100.0 pg/mL  Comprehensive metabolic panel  Result Value Ref Range   Sodium 129 (L) 135 - 145 mmol/L   Potassium 2.5 (LL) 3.5 - 5.1 mmol/L   Chloride 93 (L) 101 - 111 mmol/L   CO2 22 22 - 32 mmol/L   Glucose, Bld 87 65 - 99 mg/dL   BUN 9 6 - 20 mg/dL   Creatinine, Ser 1.191.28 (H) 0.61 - 1.24 mg/dL   Calcium 8.3 (L) 8.9 - 10.3 mg/dL   Total Protein 7.9 6.5 - 8.1 g/dL   Albumin 2.1 (L) 3.5 - 5.0 g/dL   AST 147121 (H) 15 - 41 U/L   ALT 34 17 - 63 U/L   Alkaline Phosphatase 138 (H) 38 - 126 U/L   Total Bilirubin 14.9 (H) 0.3 - 1.2 mg/dL   GFR calc non Af Amer >60 >60 mL/min   GFR calc Af Amer >60 >60 mL/min   Anion gap 14 5 - 15  Ammonia  Result Value Ref Range   Ammonia 55 (H) 9 - 35 umol/L  Ethanol  Result Value Ref Range   Alcohol, Ethyl (B) 44 (H) <5 mg/dL  POC occult blood, ED  Result Value Ref Range   Fecal Occult Bld POSITIVE (A) NEGATIVE  Type and screen [facility]  Result Value Ref Range   ABO/RH(D) A POS    Antibody Screen PENDING    Sample Expiration 06/03/2017    Dg Chest 2 View Result Date: 06/13/2017 CLINICAL DATA:  Shortness of breath. EXAM: CHEST  2 VIEW COMPARISON:  Radiograph of September 24, 2016. FINDINGS: Stable cardiomediastinal silhouette. Atherosclerosis of thoracic aorta is noted. Left-sided pacemaker is unchanged in position. No pneumothorax is noted. Left lung is clear. Stable mild central pulmonary vascular congestion is noted. Moderate right pleural effusion is noted with probable underlying atelectasis or infiltrate. Bony thorax is unremarkable. IMPRESSION: Mild central pulmonary vascular congestion. Aortic atherosclerosis. Moderate right pleural effusion with probable underlying atelectasis or infiltrate. Electronically Signed   By: Lupita RaiderJames  Green Jr, M.D.   On: 06/08/2017 17:42   Ct Head Wo Contrast Result Date: 06/22/2017 CLINICAL DATA:  Increased shortness of breath and abdominal swelling for 1 week, altered level of consciousness, hypertension, CHF, cirrhosis, smoker EXAM: CT HEAD WITHOUT CONTRAST TECHNIQUE: Contiguous axial images were obtained from the base of the skull through the vertex without intravenous contrast. Sagittal and coronal MPR images reconstructed from axial data set. COMPARISON:  None FINDINGS: Brain: Minimal atrophy. Normal ventricular morphology. No  midline shift or mass effect. Otherwise normal appearance of brain parenchyma. No intracranial hemorrhage, mass lesion or evidence acute infarction. No extra-axial fluid collections. Vascular: Atherosclerotic calcifications at the internal carotid arteries at the skullbase Skull: Intact Sinuses/Orbits: Clear paranasal sinuses and LEFT mastoid air cells. Prior RIGHT mastoidectomy. Other: N/A IMPRESSION: No acute intracranial abnormalities. Prominent atherosclerotic calcifications at the internal carotid arteries bilaterally at skullbase for age. Electronically Signed    By: Ulyses Southward M.D.   On: June 25, 2017 17:26    1725:  IV protonix bolus and gtt started. Potassium repleted IV and judicious IVF ordered for orthostasis on VS. Abd remains benign, resps without distress. SBP 116 on my re-exam. T&S ordered. T/C to GI Dr. Jena Gauss, case discussed, including:  HPI, pertinent PM/SHx, VS/PE, dx testing, ED course and treatment:  Agrees with ED workup, appears to be decompensated liver disease at this time, will consult.   1804:  SBP remains 110's on my re-exam. Abd benign. T/C to [facility] Dr. Alvester Morin, case discussed, including:  HPI, pertinent PM/SHx, VS/PE, dx testing, ED course and treatment:  Agreeable to come to ED for evaluation, requests to order lactic acid level.     Final Clinical Impressions(s) / ED Diagnoses   Final diagnoses:  None    New Prescriptions New Prescriptions   No medications on file      Samuel Jester, DO 06/01/2017 [id number]

## 2017-05-31 NOTE — Telephone Encounter (Signed)
REVIEWED. AGREE. NO ADDITIONAL RECOMMENDATIONS. 

## 2017-05-31 NOTE — H&P (Addendum)
History and Physical    Russell Trujillo ZDG:[id number] DOB: Apr 26, 1961 DOA: 06/06/2017  Referring MD/NP/PA: Clarene Duke  PCP: Dettinger, Elige Radon, MD  Outpatient Specialists: Dr. Darrick Penna- GI   Chief Complaint: Acute Resp Failure, Anasarca, Hypokalemia, ? Sepsis, Jaundice   HPI: Russell Trujillo is a 56 y.o. male with medical history significant of unspecified CHF, status post AICD implant, avascular necrosis of the right hip, vesicorectal fistula, GERD hepatic encephalopathy, ascites presenting with acute respiratory failure anasarca hypokalemia, jaundice and questionable sepsis. Patient reports progressive shortness of breath as well as per generalized edema over the past 2-3 days. States the symptoms are fairly chronic however they have acutely worsen with worsening edema and worsening shortness of breath evolving orthopnea as well as dyspnea with exertion. No fevers or chills. No nausea vomiting. Chronic alcohol abuse. Patient states he drank 5-6 shots of vodka 2-3 days ago. Reports having black stools though this is fairly chronic. No changes in bowel or urinary pattern. States he's been compliant with his medication regimen though he is unsure of the regimen.  ED Course: Presented to the ER afebrile with blood pressures in the 110s to 100s. Satting in the upper 90s on room air. Notable labs include a white count of 11, hemoglobin 8.4, platelets of 145, sodium of 129, potassium 2.5, calcium 8.3, albumin 2.1, T bili of 14.9 ethanol level of 44, fecal occult blood positive INR 2.0. Per Dr. Clarene Duke, case discussed with Dr. Christell Constant with GI with plan for formal consultation in the morning.  Review of Systems: As per HPI otherwise 10 point review of systems negative.    Past Medical History:  Diagnosis Date  . AICD (automatic cardioverter/defibrillator) present   . Allergy   . Anxiety   . Arthritis   . 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 CHF (congestive heart failure) (HCC)   . Cirrhosis (HCC)    . Depression   . Dyspnea   . Esophageal varices (HCC)   . Fistula    Bladder Fistula  . Gastritis and duodenitis   . GERD (gastroesophageal reflux disease)   . Gout   . Headache   . Hepatitis    ? unsure   . Hyperlipidemia   . Hypertension     Past Surgical History:  Procedure Laterality Date  . BIOPSY  01/26/2017   Procedure: BIOPSY;  Surgeon: West Bali, MD;  Location: [facility];  Service: Endoscopy;;  gastric biopsies for h pylori  . COLONOSCOPY  Nov 2016   Dr. Rossie Muskrat at [facility]: two 6-8 mm polyps (tubular adenomas). Surveillance 2019.   Marland Kitchen EP IMPLANTABLE DEVICE  2015   defibrillator/pacemaker  . ESOPHAGOGASTRODUODENOSCOPY (EGD) WITH PROPOFOL N/A 01/26/2017   Procedure: ESOPHAGOGASTRODUODENOSCOPY (EGD) WITH PROPOFOL;  Surgeon: West Bali, MD;  Location: [facility];  Service: Endoscopy;  Laterality: N/A;  815  . heart catherization  2015   no blockages  . HERNIA REPAIR  09/2016   Ruptured Umbilical Hernia  . TENDON REPAIR  1986   right hand  . TUMOR REMOVAL  1995   right ear     reports that he has been smoking Cigarettes.  He has a 40.00 pack-year smoking history. He has never used smokeless tobacco. He reports that he drinks alcohol. He reports that he uses drugs, including Marijuana.  Allergies  Allergen Reactions  . Coreg [Carvedilol] Swelling    Swelling of lips    Family History  Problem Relation Age of Onset  .  Breast cancer Mother   . Depression Mother   . Diabetes Mother   . Hearing loss Mother   . Hyperlipidemia Mother   . Hypertension Mother   . Mental illness Mother   . Vision loss Mother   . Alcohol abuse Father   . Arthritis Father   . Early death Father   . Hyperlipidemia Father   . Hypertension Father   . Vision loss Father   . Colon cancer Paternal Grandmother   . Liver cancer Cousin   . Colon polyps Neg Hx     Prior to Admission medications   Medication Sig Start Date End Date Taking? Authorizing Provider  aspirin EC  81 MG tablet Take 81 mg by mouth daily.   Yes [provider]  fenofibrate (TRICOR) 145 MG tablet Take 1 tablet (145 mg total) by mouth daily. 02/20/16  Yes Dettinger, Elige Radon, MD  FLUoxetine (PROZAC) 10 MG capsule Take 1 capsule (10 mg total) by mouth daily. 10/28/16  Yes Dettinger, Elige Radon, MD  metoprolol succinate (TOPROL-XL) 25 MG 24 hr tablet TAKE 1 TABLET DAILY 08/06/16  Yes Dettinger, Elige Radon, MD  pantoprazole (PROTONIX) 40 MG tablet 1 PO 30 MINUTES PRIOR TO MEALS BID FOR 3 MOS THEN QD Patient taking differently: Take 40 mg by mouth 2 (two) times daily.  01/26/17  Yes West Bali, MD    Physical Exam: Vitals:   06/02/2017 1630 06/21/2017 1700 05/30/2017 1730 06/07/2017 1814  BP: (!) 103/53 (!) 100/47  (!) 116/56  Pulse: 87 87 87 73  Resp: (!) 26 16 16 18   Temp:      SpO2: 97% 96% 97% 97%  Weight:      Height:          Constitutional: NAD, calm, comfortable Vitals:   06/08/2017 1630 06/12/2017 1700 06/25/2017 1730 06/14/2017 1814  BP: (!) 103/53 (!) 100/47  (!) 116/56  Pulse: 87 87 87 73  Resp: (!) 26 16 16 18   Temp:      SpO2: 97% 96% 97% 97%  Weight:      Height:       Eyes: PERRL, lids and conjunctivae normal, + scleral icterus  ENMT: Mucous membranes are moist. Posterior pharynx clear of any exudate or lesions.Normal dentition.  Neck: normal, supple, no masses, no thyromegaly Respiratory: clear to auscultation bilaterally, no wheezing, no crackles. Normal respiratory effort. No accessory muscle use.  Cardiovascular: Regular rate and rhythm, no murmurs / rubs / gallops. Noted marked ascites and LE edema.  No carotid bruits.  Abdomen: + hepatosplenomegaly, + adb distension.  Musculoskeletal: no clubbing / cyanosis. No joint deformity upper and lower extremities. Stable ROM, marked LE edema.  Skin: + jaundice Neurologic: CN 2-12 grossly intact. Sensation intact, DTR normal. Strength 5/5 in all 4.  Psychiatric: Normal judgment and insight. Alert and oriented x 3. Normal  mood.   Labs on Admission: I have personally reviewed following labs and imaging studies  CBC:  Recent Labs Lab 05/30/2017 1611  WBC 11.1*  NEUTROABS 8.6*  HGB 8.4*  HCT 23.8*  MCV 94.8  PLT 145*   Basic Metabolic Panel:  Recent Labs Lab 06/25/2017 1611  NA 129*  K 2.5*  CL 93*  CO2 22  GLUCOSE 87  BUN 9  CREATININE 1.28*  CALCIUM 8.3*   GFR: Estimated Creatinine Clearance: 81.8 mL/min (A) (by C-G formula based on SCr of 1.28 mg/dL (H)). Liver Function Tests:  Recent Labs Lab 06/11/2017 1611  AST 121*  ALT  34  ALKPHOS 138*  BILITOT 14.9*  PROT 7.9  ALBUMIN 2.1*   No results for input(s): LIPASE, AMYLASE in the last 168 hours.  Recent Labs Lab 06/23/2017 1617  AMMONIA 55*   Coagulation Profile:  Recent Labs Lab 06/21/2017 1611  INR 2.01   Cardiac Enzymes:  Recent Labs Lab 05/28/2017 1611  TROPONINI <0.03   BNP (last 3 results) No results for input(s): PROBNP in the last 8760 hours. HbA1C: No results for input(s): HGBA1C in the last 72 hours. CBG: No results for input(s): GLUCAP in the last 168 hours. Lipid Profile: No results for input(s): CHOL, HDL, LDLCALC, TRIG, CHOLHDL, LDLDIRECT in the last 72 hours. Thyroid Function Tests: No results for input(s): TSH, T4TOTAL, FREET4, T3FREE, THYROIDAB in the last 72 hours. Anemia Panel: No results for input(s): VITAMINB12, FOLATE, FERRITIN, TIBC, IRON, RETICCTPCT in the last 72 hours. Urine analysis:    Component Value Date/Time   COLORURINE BROWN (A) 09/24/2016 1128   APPEARANCEUR CLOUDY (A) 09/24/2016 1128   LABSPEC 1.015 09/24/2016 1128   PHURINE 8.0 09/24/2016 1128   GLUCOSEU 100 (A) 09/24/2016 1128   HGBUR LARGE (A) 09/24/2016 1128   BILIRUBINUR LARGE (A) 09/24/2016 1128   KETONESUR TRACE (A) 09/24/2016 1128   PROTEINUR 100 (A) 09/24/2016 1128   NITRITE NEGATIVE 09/24/2016 1128   LEUKOCYTESUR LARGE (A) 09/24/2016 1128   Sepsis Labs: @LABRCNTIP (procalcitonin:4,lacticidven:4) )No results  found for this or any previous visit (from the past 240 hour(s)).   Radiological Exams on Admission: Dg Chest 2 View  Result Date: 06/01/2017 CLINICAL DATA:  Shortness of breath. EXAM: CHEST  2 VIEW COMPARISON:  Radiograph of September 24, 2016. FINDINGS: Stable cardiomediastinal silhouette. Atherosclerosis of thoracic aorta is noted. Left-sided pacemaker is unchanged in position. No pneumothorax is noted. Left lung is clear. Stable mild central pulmonary vascular congestion is noted. Moderate right pleural effusion is noted with probable underlying atelectasis or infiltrate. Bony thorax is unremarkable. IMPRESSION: Mild central pulmonary vascular congestion. Aortic atherosclerosis. Moderate right pleural effusion with probable underlying atelectasis or infiltrate. Electronically Signed   By: Lupita Raider, M.D.   On: 06/03/2017 17:42   Ct Head Wo Contrast  Result Date: 05/27/2017 CLINICAL DATA:  Increased shortness of breath and abdominal swelling for 1 week, altered level of consciousness, hypertension, CHF, cirrhosis, smoker EXAM: CT HEAD WITHOUT CONTRAST TECHNIQUE: Contiguous axial images were obtained from the base of the skull through the vertex without intravenous contrast. Sagittal and coronal MPR images reconstructed from axial data set. COMPARISON:  None FINDINGS: Brain: Minimal atrophy. Normal ventricular morphology. No midline shift or mass effect. Otherwise normal appearance of brain parenchyma. No intracranial hemorrhage, mass lesion or evidence acute infarction. No extra-axial fluid collections. Vascular: Atherosclerotic calcifications at the internal carotid arteries at the skullbase Skull: Intact Sinuses/Orbits: Clear paranasal sinuses and LEFT mastoid air cells. Prior RIGHT mastoidectomy. Other: N/A IMPRESSION: No acute intracranial abnormalities. Prominent atherosclerotic calcifications at the internal carotid arteries bilaterally at skullbase for age. Electronically Signed   By: Ulyses Southward M.D.   On: 06/14/2017 17:26      Assessment/Plan Active Problems:   Anemia in other chronic diseases classified elsewhere   Acute respiratory failure (HCC)   Jaundice   Alcoholic cirrhosis of liver with ascites (HCC)   Anasarca   Hypokalemia   1- Acute Resp Failure -Likely multifactorial with contributions of unspecified CHF, anasarca/large swelling. -Noted portal hypertension and review of most recent GI note -No hypoxia at present -Most recent echo December 2017 with  fairly normal systolic and diastolic function -Noted central pulmonary vascular congestion as well as right pleural effusion on chest x-ray -IV diuresis -Will benefit from repeat paracentesis as well as likely thoracentesis -Noted anemia which is likely a confounding issue -We'll transfuse 1-2 units of packed red blood cells in the setting of Hemoccult-positive stools -We'll need to follow volume status closely  2-alcoholic cirrhosis -AST 121, ALT 34 -Alcohol level 44 -Child Pugh score B-C -MELD score 30 -noted ascites w/ elevated WBC and hypotension.  -will cover empirically w/ vanc and zosyn  -IV diuresis  -defer BB given hypotension -trend LFTs -discussed ETOH cessation   3-Jaundice -likely secondary to above  -T bili 14 -MELD score 30  -f/u GI consult in am   4-Anasarca -Multifactorial in the setting of CHF and cirrhotic disease -IV Lasix and spironolactone -Follow volume status with PRBC transfusion -Noted pleural effusion on chest x-ray as well as pulmonary vascular congestion -Would benefit from paracentesis as well as thoracentesis -IR consult for paracentesis has been placed.  5-anemia -Likely secondary to chronic disease -Unclear this is a subacute versus acute process -We'll transfuse in the setting of above -Follow volume status  6-hypokalemia -Replete -Check magnesium level  7-Ventral hernia /Colovesical fistula  -Chronic Issues  -appears stable    8-Hypotension -likely secondary to 3rd spacing  -follow w/ pRBCs  DVT prophylaxis: SCDs. INR 2.0  Code Status: Full Code   Family Communication: none  Disposition Plan: pending further evaluation   Consults called: GI in ER Kendell Bane([place]), IR   Admission status: Inpt    Floydene FlockSteven J Sung Renton MD [facility] Pager [phone]- [phone]  If 7PM-7AM, please contact night-coverage [email or web address] Password [email or web address]  06/03/2017, 6:31 PM

## 2017-05-31 NOTE — ED Notes (Signed)
CRITICAL VALUE ALERT  Critical Value:  Potassium = 2.5  Date & Time Notied:  06/08/2017  1705  Provider Notified: Mcmanus  Orders Received/Actions taken:

## 2017-05-31 NOTE — Progress Notes (Signed)
Pt requesting nicotine patch and something to help him sleep, MD on call paged and made aware. Waiting for orders.

## 2017-06-01 ENCOUNTER — Inpatient Hospital Stay (HOSPITAL_COMMUNITY): Payer: Medicaid Other

## 2017-06-01 ENCOUNTER — Encounter (HOSPITAL_COMMUNITY): Payer: Self-pay | Admitting: Internal Medicine

## 2017-06-01 DIAGNOSIS — K7030 Alcoholic cirrhosis of liver without ascites: Secondary | ICD-10-CM

## 2017-06-01 DIAGNOSIS — K7290 Hepatic failure, unspecified without coma: Secondary | ICD-10-CM | POA: Insufficient documentation

## 2017-06-01 DIAGNOSIS — K7460 Unspecified cirrhosis of liver: Secondary | ICD-10-CM | POA: Insufficient documentation

## 2017-06-01 DIAGNOSIS — N390 Urinary tract infection, site not specified: Secondary | ICD-10-CM | POA: Diagnosis present

## 2017-06-01 DIAGNOSIS — K922 Gastrointestinal hemorrhage, unspecified: Secondary | ICD-10-CM | POA: Diagnosis present

## 2017-06-01 DIAGNOSIS — K7469 Other cirrhosis of liver: Secondary | ICD-10-CM | POA: Insufficient documentation

## 2017-06-01 DIAGNOSIS — F1010 Alcohol abuse, uncomplicated: Secondary | ICD-10-CM

## 2017-06-01 DIAGNOSIS — D649 Anemia, unspecified: Secondary | ICD-10-CM | POA: Insufficient documentation

## 2017-06-01 DIAGNOSIS — R601 Generalized edema: Secondary | ICD-10-CM

## 2017-06-01 HISTORY — DX: Alcohol abuse, uncomplicated: F10.10

## 2017-06-01 LAB — BODY FLUID CELL COUNT WITH DIFFERENTIAL
Eos, Fluid: 1 %
Lymphs, Fluid: 37 %
MONOCYTE-MACROPHAGE-SEROUS FLUID: 49 % — AB (ref 50–90)
Neutrophil Count, Fluid: 13 % (ref 0–25)
Other Cells, Fluid: 0 %
WBC FLUID: 419 uL (ref 0–1000)

## 2017-06-01 LAB — COMPREHENSIVE METABOLIC PANEL
ALBUMIN: 2 g/dL — AB (ref 3.5–5.0)
ALK PHOS: 127 U/L — AB (ref 38–126)
ALT: 34 U/L (ref 17–63)
ANION GAP: 11 (ref 5–15)
AST: 115 U/L — ABNORMAL HIGH (ref 15–41)
BUN: 8 mg/dL (ref 6–20)
CALCIUM: 8 mg/dL — AB (ref 8.9–10.3)
CHLORIDE: 95 mmol/L — AB (ref 101–111)
CO2: 26 mmol/L (ref 22–32)
Creatinine, Ser: 1.19 mg/dL (ref 0.61–1.24)
GFR calc Af Amer: >60 mL/min (ref >60)
GFR calc non Af Amer: >60 mL/min (ref >60)
GLUCOSE: 86 mg/dL (ref 65–99)
Potassium: 2.7 mmol/L — CL (ref 3.5–5.1)
SODIUM: 132 mmol/L — AB (ref 135–145)
Total Bilirubin: 14.9 mg/dL — ABNORMAL HIGH (ref 0.3–1.2)
Total Protein: 7.7 g/dL (ref 6.5–8.1)

## 2017-06-01 LAB — GRAM STAIN

## 2017-06-01 LAB — LACTATE DEHYDROGENASE, PLEURAL OR PERITONEAL FLUID: LD, Fluid: 67 U/L — ABNORMAL HIGH (ref 3–23)

## 2017-06-01 LAB — CBC
HCT: 25.7 % — ABNORMAL LOW (ref 39.0–52.0)
HEMOGLOBIN: 8.7 g/dL — AB (ref 13.0–17.0)
MCH: 31.6 pg (ref 26.0–34.0)
MCHC: 33.9 g/dL (ref 30.0–36.0)
MCV: 93.5 fL (ref 78.0–100.0)
Platelets: 140 10*3/uL — ABNORMAL LOW (ref 150–400)
RBC: 2.75 MIL/uL — AB (ref 4.22–5.81)
RDW: 25 % — ABNORMAL HIGH (ref 11.5–15.5)
WBC: 10.6 10*3/uL — ABNORMAL HIGH (ref 4.0–10.5)

## 2017-06-01 LAB — PROTIME-INR
INR: 2
Prothrombin Time: 22.9 seconds — ABNORMAL HIGH (ref 11.4–15.2)

## 2017-06-01 LAB — GLUCOSE, PLEURAL OR PERITONEAL FLUID: Glucose, Fluid: 95 mg/dL

## 2017-06-01 LAB — PROTEIN, PLEURAL OR PERITONEAL FLUID: Total protein, fluid: 4.7 g/dL

## 2017-06-01 LAB — MAGNESIUM: Magnesium: 1.1 mg/dL — ABNORMAL LOW (ref 1.7–2.4)

## 2017-06-01 LAB — HIV ANTIBODY (ROUTINE TESTING W REFLEX): HIV SCREEN 4TH GENERATION: NONREACTIVE

## 2017-06-01 LAB — ALBUMIN, PLEURAL OR PERITONEAL FLUID: ALBUMIN FL: 1.8 g/dL

## 2017-06-01 MED ORDER — LORAZEPAM 2 MG/ML IJ SOLN
1.0000 mg | Freq: Four times a day (QID) | INTRAMUSCULAR | Status: AC | PRN
  Administered 2017-06-03 – 2017-06-04 (×3): 1 mg via INTRAVENOUS
  Filled 2017-06-01 (×3): qty 1

## 2017-06-01 MED ORDER — RIFAXIMIN 550 MG PO TABS
550.0000 mg | ORAL_TABLET | Freq: Two times a day (BID) | ORAL | Status: DC
  Administered 2017-06-01 (×2): 550 mg via ORAL
  Filled 2017-06-01 (×2): qty 1

## 2017-06-01 MED ORDER — FLUOXETINE HCL 10 MG PO CAPS
10.0000 mg | ORAL_CAPSULE | Freq: Every day | ORAL | Status: DC
Start: 2017-06-01 — End: 2017-06-04
  Administered 2017-06-01: 10 mg via ORAL
  Filled 2017-06-01: qty 1

## 2017-06-01 MED ORDER — LORAZEPAM 2 MG/ML IJ SOLN: 0.0000 mg | Freq: Two times a day (BID) | INTRAMUSCULAR | Status: DC

## 2017-06-01 MED ORDER — PANTOPRAZOLE SODIUM 40 MG IV SOLR
INTRAVENOUS | Status: AC
  Filled 2017-06-01: qty 80

## 2017-06-01 MED ORDER — VITAMIN B-1 100 MG PO TABS
100.0000 mg | ORAL_TABLET | Freq: Every day | ORAL | Status: DC
  Administered 2017-06-01: 100 mg via ORAL
  Filled 2017-06-01: qty 1

## 2017-06-01 MED ORDER — FOLIC ACID 1 MG PO TABS
1.0000 mg | ORAL_TABLET | Freq: Every day | ORAL | Status: DC
  Administered 2017-06-01: 1 mg via ORAL
  Filled 2017-06-01: qty 1

## 2017-06-01 MED ORDER — POTASSIUM CHLORIDE CRYS ER 20 MEQ PO TBCR
40.0000 meq | EXTENDED_RELEASE_TABLET | ORAL | Status: AC
  Administered 2017-06-01 (×3): 40 meq via ORAL
  Filled 2017-06-01 (×3): qty 2

## 2017-06-01 MED ORDER — FENOFIBRATE 160 MG PO TABS
160.0000 mg | ORAL_TABLET | Freq: Every day | ORAL | Status: DC
  Administered 2017-06-01: 160 mg via ORAL
  Filled 2017-06-01: qty 1

## 2017-06-01 MED ORDER — THIAMINE HCL 100 MG/ML IJ SOLN
100.0000 mg | Freq: Every day | INTRAMUSCULAR | Status: DC
  Administered 2017-06-02: 100 mg via INTRAVENOUS
  Filled 2017-06-01 (×2): qty 2

## 2017-06-01 MED ORDER — ALBUMIN HUMAN 25 % IV SOLN
25.0000 g | Freq: Once | INTRAVENOUS | Status: AC
  Administered 2017-06-01: 25 g via INTRAVENOUS
  Filled 2017-06-01: qty 100

## 2017-06-01 MED ORDER — FUROSEMIDE 40 MG PO TABS
40.0000 mg | ORAL_TABLET | Freq: Two times a day (BID) | ORAL | Status: DC
  Administered 2017-06-01: 40 mg via ORAL
  Filled 2017-06-01 (×2): qty 1

## 2017-06-01 MED ORDER — LORAZEPAM 2 MG/ML IJ SOLN
0.0000 mg | Freq: Four times a day (QID) | INTRAMUSCULAR | Status: AC
  Administered 2017-06-01: 1 mg via INTRAVENOUS
  Administered 2017-06-02 – 2017-06-03 (×2): 2 mg via INTRAVENOUS
  Filled 2017-06-01 (×4): qty 1

## 2017-06-01 MED ORDER — MAGNESIUM SULFATE 4 GM/100ML IV SOLN
4.0000 g | Freq: Once | INTRAVENOUS | Status: AC
  Administered 2017-06-01: 4 g via INTRAVENOUS
  Filled 2017-06-01: qty 100

## 2017-06-01 MED ORDER — LACTULOSE 10 GM/15ML PO SOLN
20.0000 g | Freq: Two times a day (BID) | ORAL | Status: DC
Start: 2017-06-01 — End: 2017-06-02
  Administered 2017-06-01 (×2): 20 g via ORAL
  Filled 2017-06-01 (×2): qty 30

## 2017-06-01 MED ORDER — LORAZEPAM 1 MG PO TABS: 1.0000 mg | ORAL_TABLET | Freq: Four times a day (QID) | ORAL | Status: AC | PRN

## 2017-06-01 MED ORDER — ADULT MULTIVITAMIN W/MINERALS CH
1.0000 | ORAL_TABLET | Freq: Every day | ORAL | Status: DC
  Administered 2017-06-01: 1 via ORAL
  Filled 2017-06-01: qty 1

## 2017-06-01 MED ORDER — SPIRONOLACTONE 25 MG PO TABS
100.0000 mg | ORAL_TABLET | Freq: Two times a day (BID) | ORAL | Status: DC
  Filled 2017-06-01: qty 4

## 2017-06-01 MED ORDER — PANTOPRAZOLE SODIUM 40 MG IV SOLR
40.0000 mg | Freq: Two times a day (BID) | INTRAVENOUS | Status: DC
  Administered 2017-06-01 – 2017-06-04 (×5): 40 mg via INTRAVENOUS
  Filled 2017-06-01 (×5): qty 40

## 2017-06-01 NOTE — Consult Note (Signed)
Referring Provider: Dr. Clarene Duke  Primary Care Physician:  Dettinger, Elige Radon, MD Primary Gastroenterologist:  Dr. Darrick Penna   Date of Admission: 06/07/2017 Date of Consultation: 06/01/17  Reason for Consultation:    HPI:  Russell Trujillo is a 56 y.o. year old male with a history of ETOH cirrhosis, last colonoscopy Nov 2016 at [facility] with adenomas. EGD last completed in April 2018 with portal hypertensive gastropathy, Grade 1 esophageal varices, gastritis and duodenitis. History of alcohol use. Overdue for [facility] screening. Presented to ED with decompensated cirrhosis, anasarca, acute respiratory failure, anemia, moderate right pleural effusion, concern for sepsis.    Thoracentesis completed this morning with 1400 ml amber colored pleural fluid. No ascites for paracentesis, even for diagnostic purposes. States he presented due to lower extremity edema, confusion, hallucinations. Notes his mom died in January 09, 2023 and he is in process of completing divorce proceedings with his wife; therefore, he has been dealing with depression and drinking intermittently. He is unable to quantify how much he drinks but tells me "I am doing well 90% of the time". Last drink several days ago with his friends. States he doesn't have a car, so he has not been compliant with outpatient medications. He has not been on lasix or aldactone in quite some time. No abdominal pain. Feels depressed. Noted black stool a few days ago, then brown. States this is chronic. Heme positive in ED.   Past Medical History:  Diagnosis Date  . AICD (automatic cardioverter/defibrillator) present   . Alcohol abuse 06/01/2017  . Allergy   . Anxiety   . Arthritis   . 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 CHF (congestive heart failure) (HCC)   . Cirrhosis (HCC)   . Depression   . Dyspnea   . Esophageal varices (HCC)   . Fistula    Bladder Fistula  . Gastritis and duodenitis   . GERD (gastroesophageal reflux disease)   . Gout   . Headache    . Hepatitis    ? unsure   . Hyperlipidemia   . Hypertension     Past Surgical History:  Procedure Laterality Date  . BIOPSY  01/26/2017   Procedure: BIOPSY;  Surgeon: West Bali, MD;  Location: [facility];  Service: Endoscopy;;  gastric biopsies for h pylori  . COLONOSCOPY  Nov 2016   Dr. Rossie Muskrat at [facility]: two 6-8 mm polyps (tubular adenomas). Surveillance 2019.   Marland Kitchen EP IMPLANTABLE DEVICE  2015   defibrillator/pacemaker  . ESOPHAGOGASTRODUODENOSCOPY (EGD) WITH PROPOFOL N/A 01/26/2017   Dr. Darrick Penna: Grade 1 esophageal varices, portal gastropathy, gastritis/duodenitis  . heart catherization  2015   no blockages  . HERNIA REPAIR  09/2016   Ruptured Umbilical Hernia  . TENDON REPAIR  1986   right hand  . TUMOR REMOVAL  1995   right ear    Prior to Admission medications   Medication Sig Start Date End Date Taking? Authorizing Provider  aspirin EC 81 MG tablet Take 81 mg by mouth daily.   Yes [provider]  fenofibrate (TRICOR) 145 MG tablet Take 1 tablet (145 mg total) by mouth daily. 02/20/16  Yes Dettinger, Elige Radon, MD  FLUoxetine (PROZAC) 10 MG capsule Take 1 capsule (10 mg total) by mouth daily. 10/28/16  Yes Dettinger, Elige Radon, MD  metoprolol succinate (TOPROL-XL) 25 MG 24 hr tablet TAKE 1 TABLET DAILY 08/06/16  Yes Dettinger, Elige Radon, MD  pantoprazole (PROTONIX) 40 MG tablet 1 PO 30 MINUTES  PRIOR TO MEALS BID FOR 3 MOS THEN QD Patient taking differently: Take 40 mg by mouth 2 (two) times daily.  01/26/17  Yes Fields, Darleene Cleaver, MD    Current Facility-Administered Medications  Medication Dose Route Frequency Provider Last Rate Last Dose  . 0.9 %  sodium chloride infusion   Intravenous Continuous Samuel Jester, DO 75 mL/hr at 06/01/17 0158    . albumin human 25 % solution 25 g  25 g Intravenous Once Rodolph Bong, MD      . fenofibrate tablet 160 mg  160 mg Oral Daily Rodolph Bong, MD      . FLUoxetine (PROZAC) capsule 10 mg  10 mg Oral Daily  Rodolph Bong, MD      . folic acid (FOLVITE) tablet 1 mg  1 mg Oral Daily Rodolph Bong, MD      . furosemide (LASIX) tablet 40 mg  40 mg Oral BID Floydene Flock, MD   40 mg at 06/23/2017 2031  . LORazepam (ATIVAN) injection 0-4 mg  0-4 mg Intravenous Q6H Rodolph Bong, MD       Followed by  . [START ON 06/03/2017] LORazepam (ATIVAN) injection 0-4 mg  0-4 mg Intravenous Q12H Rodolph Bong, MD      . LORazepam (ATIVAN) tablet 1 mg  1 mg Oral Q6H PRN Rodolph Bong, MD       Or  . LORazepam (ATIVAN) injection 1 mg  1 mg Intravenous Q6H PRN Rodolph Bong, MD      . magnesium sulfate IVPB 4 g 100 mL  4 g Intravenous Once Rodolph Bong, MD      . multivitamin with minerals tablet 1 tablet  1 tablet Oral Daily Rodolph Bong, MD      . nicotine (NICODERM CQ - dosed in mg/24 hours) patch 14 mg  14 mg Transdermal Daily Eduard Clos, MD   14 mg at 06/24/2017 2240  . pantoprazole (PROTONIX) 80 mg in sodium chloride 0.9 % 250 mL (0.32 mg/mL) infusion  8 mg/hr Intravenous Continuous Samuel Jester, DO 25 mL/hr at 06/01/17 0536 8 mg/hr at 06/01/17 0536  . [START ON 2017-06-24] pantoprazole (PROTONIX) injection 40 mg  40 mg Intravenous Q12H Samuel Jester, DO      . piperacillin-tazobactam (ZOSYN) IVPB 3.375 g  3.375 g Intravenous Q8H Floydene Flock, MD 12.5 mL/hr at 06/01/17 0618 3.375 g at 06/01/17 0618  . potassium chloride SA (K-DUR,KLOR-CON) CR tablet 40 mEq  40 mEq Oral Q4H Rodolph Bong, MD      . rifaximin Burman Blacksmith) tablet 550 mg  550 mg Oral BID Rodolph Bong, MD      . spironolactone (ALDACTONE) tablet 12.5 mg  12.5 mg Oral Daily Floydene Flock, MD   12.5 mg at 06/08/2017 2242  . thiamine (VITAMIN B-1) tablet 100 mg  100 mg Oral Daily Rodolph Bong, MD       Or  . thiamine (B-1) injection 100 mg  100 mg Intravenous Daily Rodolph Bong, MD      . vancomycin (VANCOCIN) IVPB 1000 mg/200 mL premix  1,000 mg Intravenous Q12H Floydene Flock, MD        Allergies as of 05/30/2017 - Review Complete 06/25/2017  Allergen Reaction Noted  . Coreg [carvedilol] Swelling 12/27/2015    Family History  Problem Relation Age of Onset  . Breast cancer Mother   . Depression Mother   . Diabetes Mother   .  Hearing loss Mother   . Hyperlipidemia Mother   . Hypertension Mother   . Mental illness Mother   . Vision loss Mother   . Alcohol abuse Father   . Arthritis Father   . Early death Father   . Hyperlipidemia Father   . Hypertension Father   . Vision loss Father   . Colon cancer Paternal Grandmother   . Liver cancer Cousin   . Colon polyps Neg Hx     Social History   Social History  . Marital status: Legally Separated    Spouse name: N/A  . Number of children: N/A  . Years of education: N/A   Occupational History  . Not on file.   Social History Main Topics  . Smoking status: Current Every Day Smoker    Packs/day: 1.00    Years: 40.00    Types: Cigarettes  . Smokeless tobacco: Never Used  . Alcohol use Yes     Comment: occ  . Drug use: Yes    Types: Marijuana  . Sexual activity: No   Other Topics Concern  . Not on file   Social History Narrative  . No narrative on file    Review of Systems: As mentioned in HPI   Physical Exam: Vital signs in last 24 hours: Temp:  [97.7 F (36.5 C)-98.4 F (36.9 C)] 97.9 F (36.6 C) (08/07 65780638) Pulse Rate:  [73-92] 90 (08/07 0638) Resp:  [9-27] 18 (08/07 0638) BP: (100-133)/(47-66) 120/60 (08/07 0638) SpO2:  [93 %-99 %] 93 % (08/07 0741) Weight:  [269 lb (122 kg)-283 lb 12.8 oz (128.7 kg)] 283 lb 12.8 oz (128.7 kg) (08/06 2114) Last BM Date: 12-27-2016 General:   Alert, jaundiced, drowsy towards end of visit but easily awakens.  Head:  Normocephalic and atraumatic. Eyes:  +scleral icterus  Ears:  Normal auditory acuity. Nose:  No deformity, discharge,  or lesions. Mouth:  No deformity or lesions, dentition normal. Lungs:  Clear throughout to  auscultation.   Diminished bases Heart:  S1 S2 present Abdomen:  +BS, obese, anasarca present, non-tender Rectal:  Deferred  Extremities:  With 3+ pitting edema to thigh  Neurologic:  Alert and  oriented x4; unable to appreciate asterixis on exam Psych:  Alert and cooperative.   Intake/Output from previous day: 08/06 0701 - 08/07 0700 In: 1746.2 [P.O.:200; I.V.:909.2; Blood:637] Out: 2200 [Urine:2200] Intake/Output this shift: No intake/output data recorded.  Lab Results:  Recent Labs  12-27-2016 1611 06/01/17 0424  WBC 11.1* 10.6*  HGB 8.4* 8.7*  HCT 23.8* 25.7*  PLT 145* 140*   BMET  Recent Labs  12-27-2016 1611 06/01/17 0424  NA 129* 132*  K 2.5* 2.7*  CL 93* 95*  CO2 22 26  GLUCOSE 87 86  BUN 9 8  CREATININE 1.28* 1.19  CALCIUM 8.3* 8.0*   LFT  Recent Labs  12-27-2016 1611 06/01/17 0424  PROT 7.9 7.7  ALBUMIN 2.1* 2.0*  AST 121* 115*  ALT 34 34  ALKPHOS 138* 127*  BILITOT 14.9* 14.9*   PT/INR  Recent Labs  12-27-2016 1611 06/01/17 0424  LABPROT 23.1* 22.9*  INR 2.01 2.00    Studies/Results: Dg Chest 2 View  Result Date: 05/28/2017 CLINICAL DATA:  Shortness of breath. EXAM: CHEST  2 VIEW COMPARISON:  Radiograph of September 24, 2016. FINDINGS: Stable cardiomediastinal silhouette. Atherosclerosis of thoracic aorta is noted. Left-sided pacemaker is unchanged in position. No pneumothorax is noted. Left lung is clear. Stable mild central pulmonary vascular congestion is noted. Moderate  right pleural effusion is noted with probable underlying atelectasis or infiltrate. Bony thorax is unremarkable. IMPRESSION: Mild central pulmonary vascular congestion. Aortic atherosclerosis. Moderate right pleural effusion with probable underlying atelectasis or infiltrate. Electronically Signed   By: Lupita Raider, M.D.   On: 06/16/2017 17:42   Ct Head Wo Contrast  Result Date: 06/11/2017 CLINICAL DATA:  Increased shortness of breath and abdominal swelling for 1 week,  altered level of consciousness, hypertension, CHF, cirrhosis, smoker EXAM: CT HEAD WITHOUT CONTRAST TECHNIQUE: Contiguous axial images were obtained from the base of the skull through the vertex without intravenous contrast. Sagittal and coronal MPR images reconstructed from axial data set. COMPARISON:  None FINDINGS: Brain: Minimal atrophy. Normal ventricular morphology. No midline shift or mass effect. Otherwise normal appearance of brain parenchyma. No intracranial hemorrhage, mass lesion or evidence acute infarction. No extra-axial fluid collections. Vascular: Atherosclerotic calcifications at the internal carotid arteries at the skullbase Skull: Intact Sinuses/Orbits: Clear paranasal sinuses and LEFT mastoid air cells. Prior RIGHT mastoidectomy. Other: N/A IMPRESSION: No acute intracranial abnormalities. Prominent atherosclerotic calcifications at the internal carotid arteries bilaterally at skullbase for age. Electronically Signed   By: Ulyses Southward M.D.   On: 05/30/2017 17:26    Impression: 56 year old male presenting with decompensated cirrhosis (MELD Na 30, Child Pugh Class C) in setting of alcohol use, with concern for sepsis on admission, anasarca, pleural effusion, and acute on chronic anemia with heme positive stool. Noted to have alcoholic hepatitis with DF of greater than 50, with prednisolone indicated if DF greater than 32 and no contraindication due to active infection. Hepatitis B surface antigen and Hep C antibody negative last year. Thoracentesis completed this morning with fluid analyses pending. No ascites available for paracentesis; no concern for SBP. Empirically started on antibiotics due to concern for sepsis on admission.   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 Discussed ETOH abstinence and guarded prognosis with patient. Homero Fellers discussion regarding mortality if continues to drink. MELD Na 30. Not a candidate for transplant with continued ETOH use. Recheck CMP, INR tomorrow. Recent EGD on  file with Grade 1 varices. Non-compliance with diuretics as outpatient.   Alcoholic hepatitis: DF greater than 50. Anticipate starting prednisolone once fluid analyses completed s/p thoracentesis and blood cultures.   Anemia: acute on chronic. Received 1 unit PRBCs yesterday evening with just slight improvement to 8.7 from 8.4. Reports intermittent dark stool prior to admission but none this admission. Brown stool on exam in ED. Heme positive. INR elevated at 2. Monitor for overt bleeding. Anemia multifactorial in this setting. Change protonix drip to IV BID.   Plan: CMP, INR in am Follow-up on pending blood cultures and fluid analyses from pleural effusion Decision for prednisolone after review of pending labs Monitor for overt GI bleeding: change Protonix to IV BID Start Lactulose BID and monitor for diarrhea Absolute ETOH cessation discussed with patient Recommend outpatient psychological referral due to depression Due for routine imaging of liver: non-emergent Daily weights Strict I/Os Will continue to follow with you  Gelene Mink, PhD, ANP-BC [facility] Gastroenterology    LOS: 1 day    06/01/2017, 8:05 AM

## 2017-06-01 NOTE — Procedures (Signed)
PreOperative Dx: RIGHT pleural effusion, alcoholic cirrhosis, ascites Postoperative Dx: RIGHT pleural effusion, alcoholic cirrhosis, minimal ascites Procedure:   US guided RIGHT thoracentesis Radiologist:  Tyron RussellBoles Anesthesia:  10 ml of 1% lidocaine Specimen:  1.4 L of bright yellow to amber colored fluid EBL:   < 1 ml Complications: None ; patient reports symptomatic improvement in breathing

## 2017-06-01 NOTE — Progress Notes (Signed)
Bed alarm going off d/t Pt trying to get OOB to get his urinal. External catheter had fallen off. RN in room with pt, pt very unsteady on feet and almost fell backward but caught himself. RN educated pt on him being a high fall risk as well as bed alarm in place. Bed in lowest locked position, call bell within reach, SR up x3. Pt educated to use the call bell when needing something instead of getting up. Pt verbalized understanding. Will continue to monitor pt

## 2017-06-01 NOTE — Progress Notes (Signed)
Pt received 1mg  of Ativan d/t a CIWA score of 5 per MD order. After receiving Ativan, pt continuously trying to get OOB, pt alert to self and year, but disoriented about where he is or situation. Dr. Toniann FailKakrakandy paged and made aware of pts confusion after receiving Ativan. Waiting for orders.

## 2017-06-01 NOTE — Progress Notes (Signed)
Dr. Toniann FailKakrakandy paged and made aware of pts order to discontinue telemetry. RN adv MD of pts K of 2.7 this morning as well as reminded him of 14beat run of SVT earlier. Order to keep telemetry continuous. Will continue to monitor pt

## 2017-06-01 NOTE — Plan of Care (Signed)
Problem: Pain Managment: Goal: General experience of comfort will improve Outcome: Progressing Pt has no c/o this shift. Pt educated on pain mgmt. Will continue to monitor pt

## 2017-06-01 NOTE — Progress Notes (Addendum)
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   Assessment & Plan:   Principal Problem:   Acute respiratory failure (HCC) Active Problems:   Essential hypertension, benign   Ventral hernia   Hepatic cirrhosis (HCC)   Vesicorectal fistula   Anemia in other chronic diseases classified elsewhere   Esophageal varices without bleeding (HCC)   Jaundice   Alcoholic cirrhosis of liver with ascites (HCC)   Anasarca   Hypokalemia   Acute lower UTI   Hypomagnesemia   Alcohol abuse   GI bleed   Anemia   Cirrhosis (HCC)   Decompensated liver disease (HCC)  #1 acute respiratory failure Likely secondary to decompensated cirrhosis, anasarca, CHF, pleural effusion. Per GI note patient has been noncompliant with his diuretics over the past few weeks. Patient with with 2-D echo done December 2017 with a normal systolic and diastolic function. Chest x-ray with pulmonary vascular congestion as well as right pleural effusion. Patient status post thoracentesis with 1400 mL of amber fluid removed with studies pending. Patient with some clinical improvement after thoracentesis. Patient with a urine output of 2.2 L. Patient has been resumed back on Lasix 40 mg by mouth twice a day as well as his spironolactone. Strict I's and O's. Daily weights. Follow.  #2 right pleural effusion Likely hepatic hydrothorax. Status post thoracentesis with 1.4 L of amber colored fluid removed. Studies pending. Continue diuretics. Follow.  #3 decompensated alcoholic cirrhosis  Meld score 30. Child Pugh score B-C. Alcohol level on admission was 44.  Noncompliance. Ultrasound done with no significant ascitic fluid for paracentesis. Patient denies any abdominal pain. Discriminant factor greater than 50. Studies from thoracentesis is pending. Patient denies any abdominal pain. Likely low concern for SBP. INR at 2. Patient with a history of grade 1 esophageal varices. Continue empiric IV antibiotics, diuretics. Xifaxan has been added. Patient also placed on lactulose per GI. GI following and appreciate input and recommendations.  #4 hypokalemia/hypomagnesemia Replete.  #5 probable UTI Urine cultures pending. Continue empiric IV Zosyn.  #6 anasarca Likely multifactorial secondary to decompensated alcoholic cirrhosis in the setting of a history of CHF and noncompliance with diuretics. Patient with a urine output of 2.2 L over the past 24 hours. Patient status post thoracentesis with 1.4 L removed. Continue diuretics of Lasix and spironolactone. Not enough ascitic fluid for paracentesis. Follow.  #7 anemia/+ FOBT Patient with a positive FOBT. Status post 1 unit packed red blood cells. Follow H&H. Continue PPI IV. GI following and appreciate input and recommendations.  #8 history of alcohol abuse Place on the Ativan withdrawal protocol. Folic acid, thiamine, multivitamin.  #9 ventral hernia/colovesicular fistula Chronic. Stable.     DVT prophylaxis: INR= 2.0 Code Status: Full Family Communication: Updated patient. No family at bedside. Disposition Plan: Home once shortness of breath has improved, anasarca improved and per GI.   Consultants:   Gastroenterology: Lewie Loron, NP 06/01/2017  Procedures:   Ultrasound-guided thoracentesis 06/01/2017 with 1400 mL of amber colored pleural fluid removed.  Chest x-ray 06/01/2017, 06/16/2017  CT head 06/09/2017  1 unit packed red blood cells 06/19/2017  Antimicrobials:   IV Zosyn 05/30/2017  IV vancomycin 06/15/2017  Subjective: Patient states shortness of breath improved after  thoracentesis. Patient states feeling a little groggy after getting his morning medications. Patient denies any chest pain. Patient denies any abdominal pain.  Objective: Vitals:   06/01/17 0741 06/01/17 0911 06/01/17 0931 06/01/17 1345  BP:  (!) 113/56 106/61 (!) 115/53  Pulse:  86 83 (!) 106  Resp:  18 18 18   Temp:    98.5 F (36.9 C)  TempSrc:    Oral  SpO2: 93% 98% 98% 95%  Weight:      Height:        Intake/Output Summary (Last 24 hours) at 06/01/17 1450 Last data filed at 06/01/17 1434  Gross per 24 hour  Intake          3566.17 ml  Output             3150 ml  Net           416.17 ml   Filed Weights   June 26, 2017 1529 2017-06-26 2114  Weight: 122 kg (269 lb) 128.7 kg (283 lb 12.8 oz)    Examination:  General exam: Appears calm and comfortable.Jaundiced. Scleral icterus Respiratory system: Poor air movement. Decreased breath sounds in the bases. Respiratory effort normal. Cardiovascular system: S1 & S2 heard, RRR. No JVD, murmurs, rubs, gallops or clicks. No pedal edema. Gastrointestinal system: Abdomen is mildly distended, soft and nontender. No organomegaly or masses felt. Normal bowel sounds heard. Anasarca Central nervous system: Alert and oriented. No focal neurological deficits. Extremities: 3-4+ bilateral lower extremity edema up to hips. Skin: Some venous stasis changes noted bilateral lower extremities. Psychiatry: Judgement and insight appear normal. Mood & affect appropriate.     Data Reviewed: I have personally reviewed following labs and imaging studies  CBC:  Recent Labs Lab 06/26/17 1611 06/01/17 0424  WBC 11.1* 10.6*  NEUTROABS 8.6*  --   HGB 8.4* 8.7*  HCT 23.8* 25.7*  MCV 94.8 93.5  PLT 145* 140*   Basic Metabolic Panel:  Recent Labs Lab 06/26/2017 1611 06/01/17 0424  NA 129* 132*  K 2.5* 2.7*  CL 93* 95*  CO2 22 26  GLUCOSE 87 86  BUN 9 8  CREATININE 1.28* 1.19  CALCIUM 8.3* 8.0*  MG 1.0* 1.1*   GFR: Estimated Creatinine  Clearance: 90.7 mL/min (by C-G formula based on SCr of 1.19 mg/dL). Liver Function Tests:  Recent Labs Lab 26-Jun-2017 1611 06/01/17 0424  AST 121* 115*  ALT 34 34  ALKPHOS 138* 127*  BILITOT 14.9* 14.9*  PROT 7.9 7.7  ALBUMIN 2.1* 2.0*   No results for input(s): LIPASE, AMYLASE in the last 168 hours.  Recent Labs Lab 06-26-17 1617  AMMONIA 55*   Coagulation Profile:  Recent Labs Lab 26-Jun-2017 1611 06/01/17 0424  INR 2.01 2.00   Cardiac Enzymes:  Recent Labs Lab June 26, 2017 1611  TROPONINI <0.03   BNP (last 3 results) No results for input(s): PROBNP in the last 8760 hours. HbA1C: No results for input(s): HGBA1C in the last 72 hours. CBG:  Recent Labs Lab 06-26-2017 2117  GLUCAP 115*   Lipid Profile: No results for input(s): CHOL, HDL, LDLCALC, TRIG, CHOLHDL, LDLDIRECT in the last 72 hours. Thyroid Function Tests: No results for input(s): TSH, T4TOTAL, FREET4, T3FREE, THYROIDAB in the last 72 hours. Anemia Panel: No results for input(s): VITAMINB12, FOLATE, FERRITIN, TIBC, IRON, RETICCTPCT in the last 72 hours. Sepsis Labs: No results for input(s): PROCALCITON, LATICACIDVEN in the last 168 hours.  Recent Results (from the past 240  hour(s))  Gram stain     Status: None   Collection Time: 06/01/17  9:19 AM  Result Value Ref Range Status   Specimen Description PLEURAL  Final   Special Requests NONE  Final   Gram Stain   Final    CYTOSPIN SMEAR NO ORGANISMS SEEN WBC SEEN WBC PRESENT,BOTH PMN AND MONONUCLEAR    Report Status 06/01/2017 FINAL  Final  Culture, blood (Routine X 2) w Reflex to ID Panel     Status: None (Preliminary result)   Collection Time: 06/01/17 12:21 PM  Result Value Ref Range Status   Specimen Description BLOOD RIGHT HAND  Final   Special Requests   Final    BOTTLES DRAWN AEROBIC ONLY Blood Culture adequate volume   Culture PENDING  Incomplete   Report Status PENDING  Incomplete  Culture, blood (Routine X 2) w Reflex to ID Panel      Status: None (Preliminary result)   Collection Time: 06/01/17 12:44 PM  Result Value Ref Range Status   Specimen Description BLOOD LEFT ARM  Final   Special Requests   Final    BOTTLES DRAWN AEROBIC AND ANAEROBIC Blood Culture adequate volume   Culture PENDING  Incomplete   Report Status PENDING  Incomplete         Radiology Studies: Dg Chest 1 View  Result Date: 06/01/2017 CLINICAL DATA:  Status post thoracentesis. EXAM: CHEST 1 VIEW COMPARISON:  One day prior FINDINGS: Degraded exam secondary to patient body habitus and AP portable technique. Apical lordotic positioning. Midline trachea. Cardiomegaly accentuated by AP portable technique. Decreased right pleural effusion. No pneumothorax. Pulmonary venous congestion, accentuated by AP portable technique. Improved right base atelectasis. IMPRESSION: Decreased right-sided pleural effusion, without evidence of pneumothorax. Cardiomegaly with mild pulmonary venous congestion and mild right base atelectasis. Decreased sensitivity and specificity exam due to technique related factors, as described above. Electronically Signed   By: Jeronimo GreavesKyle  Talbot M.D.   On: 06/01/2017 09:58   Dg Chest 2 View  Result Date: 06/08/2017 CLINICAL DATA:  Shortness of breath. EXAM: CHEST  2 VIEW COMPARISON:  Radiograph of September 24, 2016. FINDINGS: Stable cardiomediastinal silhouette. Atherosclerosis of thoracic aorta is noted. Left-sided pacemaker is unchanged in position. No pneumothorax is noted. Left lung is clear. Stable mild central pulmonary vascular congestion is noted. Moderate right pleural effusion is noted with probable underlying atelectasis or infiltrate. Bony thorax is unremarkable. IMPRESSION: Mild central pulmonary vascular congestion. Aortic atherosclerosis. Moderate right pleural effusion with probable underlying atelectasis or infiltrate. Electronically Signed   By: Lupita RaiderJames  Green Jr, M.D.   On: 06/03/2017 17:42   Ct Head Wo Contrast  Result Date:  06/19/2017 CLINICAL DATA:  Increased shortness of breath and abdominal swelling for 1 week, altered level of consciousness, hypertension, CHF, cirrhosis, smoker EXAM: CT HEAD WITHOUT CONTRAST TECHNIQUE: Contiguous axial images were obtained from the base of the skull through the vertex without intravenous contrast. Sagittal and coronal MPR images reconstructed from axial data set. COMPARISON:  None FINDINGS: Brain: Minimal atrophy. Normal ventricular morphology. No midline shift or mass effect. Otherwise normal appearance of brain parenchyma. No intracranial hemorrhage, mass lesion or evidence acute infarction. No extra-axial fluid collections. Vascular: Atherosclerotic calcifications at the internal carotid arteries at the skullbase Skull: Intact Sinuses/Orbits: Clear paranasal sinuses and LEFT mastoid air cells. Prior RIGHT mastoidectomy. Other: N/A IMPRESSION: No acute intracranial abnormalities. Prominent atherosclerotic calcifications at the internal carotid arteries bilaterally at skullbase for age. Electronically Signed   By: Angelyn PuntMark  Boles M.D.  On: 06/29/17 17:26   US Thoracentesis Asp Pleural Space W/img Guide  Result Date: 06/01/2017 INDICATION: Alcoholic cirrhosis, shortness of breath, RIGHT pleural effusion EXAM: ULTRASOUND GUIDED DIAGNOSTIC AND THERAPEUTIC RIGHT THORACENTESIS MEDICATIONS: None. COMPLICATIONS: None immediate. PROCEDURE: Procedure, benefits, and risks of procedure were discussed with patient. Written informed consent for procedure was obtained. Time out protocol followed. Pleural effusion localized by ultrasound at the posterior RIGHT hemithorax. Skin prepped and draped in usual sterile fashion. Skin and soft tissues anesthetized with 10 mL of 1% lidocaine. 8 French thoracentesis catheter placed into the RIGHT pleural space. 1.4 L of bright yellow to amber colored fluid aspirated by syringe pump. Procedure tolerated well by patient without immediate complication. Incidental finding:  Minimal ascites perihepatic. Insufficient ascites for either diagnostic or therapeutic paracentesis. FINDINGS: A total of approximately 1.4 L of RIGHT pleural fluid was removed. Samples were sent to the laboratory as requested by the clinical team. IMPRESSION: Successful ultrasound guided RIGHT thoracentesis yielding 1.4 L of pleural fluid. Insufficient ascites for paracentesis. Electronically Signed   By: Ulyses Southward M.D.   On: 06/01/2017 09:59        Scheduled Meds: . fenofibrate  160 mg Oral Daily  . FLUoxetine  10 mg Oral Daily  . folic acid  1 mg Oral Daily  . furosemide  40 mg Oral BID  . lactulose  20 g Oral BID  . LORazepam  0-4 mg Intravenous Q6H   Followed by  . [START ON 06/03/2017] LORazepam  0-4 mg Intravenous Q12H  . multivitamin with minerals  1 tablet Oral Daily  . nicotine  14 mg Transdermal Daily  . pantoprazole (PROTONIX) IV  40 mg Intravenous Q12H  . potassium chloride  40 mEq Oral Q4H  . rifaximin  550 mg Oral BID  . spironolactone  12.5 mg Oral Daily  . thiamine  100 mg Oral Daily   Or  . thiamine  100 mg Intravenous Daily   Continuous Infusions: . sodium chloride 75 mL/hr at 06/01/17 0158  . piperacillin-tazobactam (ZOSYN)  IV 3.375 g (06/01/17 1424)  . vancomycin 1,000 mg (06/01/17 1424)     LOS: 1 day    Time spent: 40 minutes    THOMPSON,DANIEL, MD [facility] Pager [phone]  If 7PM-7AM, please contact night-coverage [email or web address] Password [email or web address] 06/01/2017, 2:50 PM

## 2017-06-01 NOTE — Progress Notes (Signed)
CRITICAL VALUE ALERT  Critical Value:  Potassium 2.7  Date & Time Notied:  06/01/17 @ 0715  Provider Notified: D. Janee Mornhompson Orders Received/Actions taken: Waiting for call back

## 2017-06-01 NOTE — Progress Notes (Signed)
Thoracentesis complete no signs of distress. 1400 ml amber colored pleural fluid removed.

## 2017-06-01 NOTE — ACP (Advance Care Planning) (Signed)
Left information about Advance Directives in his room. Will check back once he gets back to his room.

## 2017-06-01 NOTE — Progress Notes (Signed)
New order for Kdur potassium q4h x3 doses. Order placed. Will continue to monitor pt

## 2017-06-01 NOTE — Progress Notes (Signed)
After paging Dr. Toniann FailKakrakandy, new order placed to page MD if potassium or magnesium levels are abnormal

## 2017-06-01 NOTE — Progress Notes (Signed)
CCMD called and stated that pt had 14 beat run WIDE complex SVT. Dr. Toniann FailKakrakandy paged and made aware. Pt asymptomatic. Will continue to monitor.

## 2017-06-01 NOTE — Care Management Note (Addendum)
Case Management Note  Patient Details  Name: Russell Trujillo MRN: [id number] Date of Birth: 06-01-1961  Subjective/Objective: Adm with ascites,  Acute respiratory failure, alcoholic cirrhosis. From home, ind with ADL's. Walks with a cane. Recently filled out application for [facility] to help with needs. CM contacted [facility], they need to speak with patient. Number of [facility] given to patient to call today. Patient does have a neighbor to call, who helps him with transportation as his car is not working recently.  No home health or oxygen PTA.              Action/Plan: Anticipate patient will DC home with self care and [facility]. CM following.    Expected Discharge Date:   06/03/2017               Expected Discharge Plan:  Home/Self Care (with [facility] services)  In-House Referral:     Discharge planning Services  CM Consult  Post Acute Care Choice:  NA Choice offered to:  NA  DME Arranged:    DME Agency:     HH Arranged:    HH Agency:     Status of Service:  Completed, signed off  If discussed at [employer] Length [employer] Stay Meetings, dates discussed:    Additional Comments:  Zamani Crocker, Chrystine OilerSharley Diane, RN 06/01/2017, 11:36 AM

## 2017-06-01 NOTE — Progress Notes (Signed)
Checked back with Mr. Russell Trujillo about his Advance Directives and we discussed it briefly. Will check back with him to assess whether he wants to complete it.

## 2017-06-01 NOTE — Progress Notes (Signed)
Late entry for today at 1155   Notified Dr. Janee Mornhompson that when in to reassess the patient after his procedure he was tremoring and stated that he had chills.  Temp was 95.7.  Blankets was added and the MD added new orders.  The temp was reassessed and it was 98.7, and he no longer c/o chills.  Also notified the MD that the patient had blood clots in his urine.

## 2017-06-02 ENCOUNTER — Inpatient Hospital Stay (HOSPITAL_COMMUNITY): Payer: Medicaid Other

## 2017-06-02 ENCOUNTER — Other Ambulatory Visit (HOSPITAL_COMMUNITY): Payer: Medicaid Other

## 2017-06-02 ENCOUNTER — Encounter (HOSPITAL_COMMUNITY): Payer: Self-pay | Admitting: Primary Care

## 2017-06-02 DIAGNOSIS — N390 Urinary tract infection, site not specified: Secondary | ICD-10-CM

## 2017-06-02 DIAGNOSIS — D696 Thrombocytopenia, unspecified: Secondary | ICD-10-CM | POA: Diagnosis present

## 2017-06-02 DIAGNOSIS — I10 Essential (primary) hypertension: Secondary | ICD-10-CM

## 2017-06-02 DIAGNOSIS — K7010 Alcoholic hepatitis without ascites: Secondary | ICD-10-CM | POA: Diagnosis present

## 2017-06-02 DIAGNOSIS — J90 Pleural effusion, not elsewhere classified: Secondary | ICD-10-CM | POA: Diagnosis present

## 2017-06-02 DIAGNOSIS — N179 Acute kidney failure, unspecified: Secondary | ICD-10-CM

## 2017-06-02 DIAGNOSIS — K7031 Alcoholic cirrhosis of liver with ascites: Secondary | ICD-10-CM

## 2017-06-02 DIAGNOSIS — Z515 Encounter for palliative care: Secondary | ICD-10-CM

## 2017-06-02 DIAGNOSIS — Z7189 Other specified counseling: Secondary | ICD-10-CM

## 2017-06-02 DIAGNOSIS — F1010 Alcohol abuse, uncomplicated: Secondary | ICD-10-CM

## 2017-06-02 DIAGNOSIS — R739 Hyperglycemia, unspecified: Secondary | ICD-10-CM | POA: Diagnosis present

## 2017-06-02 DIAGNOSIS — D689 Coagulation defect, unspecified: Secondary | ICD-10-CM | POA: Diagnosis present

## 2017-06-02 DIAGNOSIS — R0603 Acute respiratory distress: Secondary | ICD-10-CM | POA: Insufficient documentation

## 2017-06-02 DIAGNOSIS — E876 Hypokalemia: Secondary | ICD-10-CM

## 2017-06-02 LAB — BLOOD GAS, ARTERIAL
ACID-BASE EXCESS: 0.5 mmol/L (ref 0.0–2.0)
Bicarbonate: 25 mmol/L (ref 20.0–28.0)
Drawn by: 277331
O2 CONTENT: 2 L/min
O2 SAT: 94 %
PCO2 ART: 35.3 mmHg (ref 32.0–48.0)
PO2 ART: 74.4 mmHg — AB (ref 83.0–108.0)
Patient temperature: 37
pH, Arterial: 7.449 (ref 7.350–7.450)

## 2017-06-02 LAB — CBC WITH DIFFERENTIAL/PLATELET
BASOS ABS: 0 10*3/uL (ref 0.0–0.1)
BASOS PCT: 0 %
EOS ABS: 0.1 10*3/uL (ref 0.0–0.7)
Eosinophils Relative: 1 %
HEMATOCRIT: 24.5 % — AB (ref 39.0–52.0)
HEMOGLOBIN: 8.3 g/dL — AB (ref 13.0–17.0)
Lymphocytes Relative: 8 %
Lymphs Abs: 1.2 10*3/uL (ref 0.7–4.0)
MCH: 32 pg (ref 26.0–34.0)
MCHC: 33.9 g/dL (ref 30.0–36.0)
MCV: 94.6 fL (ref 78.0–100.0)
Monocytes Absolute: 1.9 10*3/uL — ABNORMAL HIGH (ref 0.1–1.0)
Monocytes Relative: 13 %
NEUTROS ABS: 11.5 10*3/uL — AB (ref 1.7–7.7)
NEUTROS PCT: 78 %
Platelets: 123 10*3/uL — ABNORMAL LOW (ref 150–400)
RBC: 2.59 MIL/uL — ABNORMAL LOW (ref 4.22–5.81)
RDW: 26.1 % — ABNORMAL HIGH (ref 11.5–15.5)
WBC: 14.7 10*3/uL — ABNORMAL HIGH (ref 4.0–10.5)

## 2017-06-02 LAB — BASIC METABOLIC PANEL
ANION GAP: 14 (ref 5–15)
BUN: 11 mg/dL (ref 6–20)
CALCIUM: 7.6 mg/dL — AB (ref 8.9–10.3)
CHLORIDE: 95 mmol/L — AB (ref 101–111)
CO2: 21 mmol/L — AB (ref 22–32)
CREATININE: 1.76 mg/dL — AB (ref 0.61–1.24)
GFR, EST AFRICAN AMERICAN: 48 mL/min — AB (ref >60)
GFR, EST NON AFRICAN AMERICAN: 42 mL/min — AB (ref >60)
Glucose, Bld: 103 mg/dL — ABNORMAL HIGH (ref 65–99)
Potassium: 3.2 mmol/L — ABNORMAL LOW (ref 3.5–5.1)
SODIUM: 130 mmol/L — AB (ref 135–145)

## 2017-06-02 LAB — AMMONIA
Ammonia: 83 umol/L — ABNORMAL HIGH (ref 9–35)
Ammonia: 57 umol/L — ABNORMAL HIGH (ref 9–35)

## 2017-06-02 LAB — COMPREHENSIVE METABOLIC PANEL
ALK PHOS: 102 U/L (ref 38–126)
ALT: 31 U/L (ref 17–63)
ANION GAP: 11 (ref 5–15)
AST: 117 U/L — ABNORMAL HIGH (ref 15–41)
Albumin: 2 g/dL — ABNORMAL LOW (ref 3.5–5.0)
BILIRUBIN TOTAL: 15 mg/dL — AB (ref 0.3–1.2)
BUN: 12 mg/dL (ref 6–20)
CALCIUM: 7.6 mg/dL — AB (ref 8.9–10.3)
CO2: 25 mmol/L (ref 22–32)
CREATININE: 1.79 mg/dL — AB (ref 0.61–1.24)
Chloride: 96 mmol/L — ABNORMAL LOW (ref 101–111)
GFR, EST AFRICAN AMERICAN: 47 mL/min — AB (ref >60)
GFR, EST NON AFRICAN AMERICAN: 41 mL/min — AB (ref >60)
Glucose, Bld: 108 mg/dL — ABNORMAL HIGH (ref 65–99)
Potassium: 3.5 mmol/L (ref 3.5–5.1)
Sodium: 132 mmol/L — ABNORMAL LOW (ref 135–145)
TOTAL PROTEIN: 7 g/dL (ref 6.5–8.1)

## 2017-06-02 LAB — PH, BODY FLUID: PH, BODY FLUID: 8

## 2017-06-02 LAB — LIPASE, BLOOD: Lipase: 42 U/L (ref 11–51)

## 2017-06-02 LAB — PROTIME-INR
INR: 2.4
PROTHROMBIN TIME: 26.6 s — AB (ref 11.4–15.2)

## 2017-06-02 LAB — MAGNESIUM: MAGNESIUM: 1.5 mg/dL — AB (ref 1.7–2.4)

## 2017-06-02 MED ORDER — POTASSIUM CHLORIDE 10 MEQ/100ML IV SOLN
10.0000 meq | INTRAVENOUS | Status: AC
  Administered 2017-06-02 (×3): 10 meq via INTRAVENOUS
  Filled 2017-06-02 (×2): qty 100

## 2017-06-02 MED ORDER — LACTULOSE 10 GM/15ML PO SOLN
30.0000 g | Freq: Three times a day (TID) | ORAL | Status: DC
  Administered 2017-06-02: 30 g via ORAL

## 2017-06-02 MED ORDER — LORAZEPAM 2 MG/ML IJ SOLN
1.0000 mg | Freq: Once | INTRAMUSCULAR | Status: AC
  Administered 2017-06-02: 1 mg via INTRAVENOUS
  Filled 2017-06-02: qty 1

## 2017-06-02 MED ORDER — MAGNESIUM SULFATE 2 GM/50ML IV SOLN
2.0000 g | Freq: Once | INTRAVENOUS | Status: AC
  Administered 2017-06-02: 2 g via INTRAVENOUS

## 2017-06-02 MED ORDER — POTASSIUM CHLORIDE 10 MEQ/100ML IV SOLN
INTRAVENOUS | Status: AC
  Filled 2017-06-02: qty 100

## 2017-06-02 MED ORDER — LORAZEPAM 2 MG/ML IJ SOLN
2.0000 mg | INTRAMUSCULAR | Status: DC | PRN
  Administered 2017-06-02 (×4): 2 mg via INTRAVENOUS
  Filled 2017-06-02 (×4): qty 1

## 2017-06-02 MED ORDER — IPRATROPIUM-ALBUTEROL 0.5-2.5 (3) MG/3ML IN SOLN: 3.0000 mL | Freq: Four times a day (QID) | RESPIRATORY_TRACT | Status: DC

## 2017-06-02 MED ORDER — IPRATROPIUM-ALBUTEROL 0.5-2.5 (3) MG/3ML IN SOLN
3.0000 mL | Freq: Four times a day (QID) | RESPIRATORY_TRACT | Status: DC
Start: 2017-06-02 — End: 2017-06-03
  Administered 2017-06-02 (×2): 3 mL via RESPIRATORY_TRACT
  Filled 2017-06-02 (×2): qty 3

## 2017-06-02 MED ORDER — MAGNESIUM SULFATE 2 GM/50ML IV SOLN
INTRAVENOUS | Status: AC
  Filled 2017-06-02: qty 50

## 2017-06-02 NOTE — Progress Notes (Signed)
Subjective:  Patient confused/combative overnight. Received multiple doses of ativan. Received dose of ativan 30 minutes before my exam. Sitter at bedside.  Objective: Vital signs in last 24 hours: Temp:  [98.2 F (36.8 C)-98.7 F (37.1 C)] 98.7 F (37.1 C) (08/08 0315) Pulse Rate:  [83-106] 85 (08/08 0315) Resp:  [18] 18 (08/07 2100) BP: (106-118)/(29-61) 110/29 (08/08 0315) SpO2:  [93 %-98 %] 93 % (08/08 0315) Weight:  [281 lb 4.9 oz (127.6 kg)] 281 lb 4.9 oz (127.6 kg) (08/08 0413) Last BM Date: 06/01/2017 General:   NAD. Sitter at bedside. Arouses but does not follow simple commands. Per sitter, patient incontinent of urine frequently and speech is garbled. Head:  Normocephalic and atraumatic. Eyes:  Sclera clear, + icterus.  Chest: diminished breath sounds right base.     Heart:  Regular rate and rhythm; no murmurs, clicks, rubs,  or gallops. Abdomen:  Soft, distended but not tense. Normal bowel sounds, without guarding.   Extremities:  2+ pitting edema and lower ext erythema. Neurologic:  Arouses with physical stimuli.  Skin: jaundice Psych:  Alert and cooperative. Normal mood and affect.  Intake/Output from previous day: 08/07 0701 - 08/08 0700 In: 2660 [P.O.:1560; IV Piggyback:1100] Out: 2350 [Urine:2350] Intake/Output this shift: No intake/output data recorded.  Lab Results: CBC  Recent Labs  06/15/2017 1611 06/01/17 0424 06/02/17 0538  WBC 11.1* 10.6* 14.7*  HGB 8.4* 8.7* 8.3*  HCT 23.8* 25.7* 24.5*  MCV 94.8 93.5 94.6  PLT 145* 140* 123*   BMET  Recent Labs  06/01/17 0424 06/01/17 2300 06/02/17 0538  NA 132* 130* 132*  K 2.7* 3.2* 3.5  CL 95* 95* 96*  CO2 26 21* 25  GLUCOSE 86 103* 108*  BUN 8 11 12   CREATININE 1.19 1.76* 1.79*  CALCIUM 8.0* 7.6* 7.6*   LFTs  Recent Labs  06/03/2017 1611 06/01/17 0424 06/02/17 0538  BILITOT 14.9* 14.9* 15.0*  ALKPHOS 138* 127* 102  AST 121* 115* 117*  ALT 34 34 31  PROT 7.9 7.7 7.0  ALBUMIN 2.1* 2.0*  2.0*   No results for input(s): LIPASE in the last 72 hours. PT/INR  Recent Labs  06/01/2017 1611 06/01/17 0424 06/02/17 0538  LABPROT 23.1* 22.9* 26.6*  INR 2.01 2.00 2.40        Imaging Studies: Dg Chest 1 View  Result Date: 06/01/2017 CLINICAL DATA:  Status post thoracentesis. EXAM: CHEST 1 VIEW COMPARISON:  One day prior FINDINGS: Degraded exam secondary to patient body habitus and AP portable technique. Apical lordotic positioning. Midline trachea. Cardiomegaly accentuated by AP portable technique. Decreased right pleural effusion. No pneumothorax. Pulmonary venous congestion, accentuated by AP portable technique. Improved right base atelectasis. IMPRESSION: Decreased right-sided pleural effusion, without evidence of pneumothorax. Cardiomegaly with mild pulmonary venous congestion and mild right base atelectasis. Decreased sensitivity and specificity exam due to technique related factors, as described above. Electronically Signed   By: Jeronimo Greaves M.D.   On: 06/01/2017 09:58   Dg Chest 2 View  Result Date: 06/25/2017 CLINICAL DATA:  Shortness of breath. EXAM: CHEST  2 VIEW COMPARISON:  Radiograph of September 24, 2016. FINDINGS: Stable cardiomediastinal silhouette. Atherosclerosis of thoracic aorta is noted. Left-sided pacemaker is unchanged in position. No pneumothorax is noted. Left lung is clear. Stable mild central pulmonary vascular congestion is noted. Moderate right pleural effusion is noted with probable underlying atelectasis or infiltrate. Bony thorax is unremarkable. IMPRESSION: Mild central pulmonary vascular congestion. Aortic atherosclerosis. Moderate right pleural effusion with probable underlying atelectasis or  infiltrate. Electronically Signed   By: Lupita Raider, M.D.   On: 06/08/2017 17:42   Ct Head Wo Contrast  Result Date: 06/10/2017 CLINICAL DATA:  Increased shortness of breath and abdominal swelling for 1 week, altered level of consciousness, hypertension, CHF,  cirrhosis, smoker EXAM: CT HEAD WITHOUT CONTRAST TECHNIQUE: Contiguous axial images were obtained from the base of the skull through the vertex without intravenous contrast. Sagittal and coronal MPR images reconstructed from axial data set. COMPARISON:  None FINDINGS: Brain: Minimal atrophy. Normal ventricular morphology. No midline shift or mass effect. Otherwise normal appearance of brain parenchyma. No intracranial hemorrhage, mass lesion or evidence acute infarction. No extra-axial fluid collections. Vascular: Atherosclerotic calcifications at the internal carotid arteries at the skullbase Skull: Intact Sinuses/Orbits: Clear paranasal sinuses and LEFT mastoid air cells. Prior RIGHT mastoidectomy. Other: N/A IMPRESSION: No acute intracranial abnormalities. Prominent atherosclerotic calcifications at the internal carotid arteries bilaterally at skullbase for age. Electronically Signed   By: Ulyses Southward M.D.   On: 06/05/2017 17:26   US Thoracentesis Asp Pleural Space W/img Guide  Result Date: 06/01/2017 INDICATION: Alcoholic cirrhosis, shortness of breath, RIGHT pleural effusion EXAM: ULTRASOUND GUIDED DIAGNOSTIC AND THERAPEUTIC RIGHT THORACENTESIS MEDICATIONS: None. COMPLICATIONS: None immediate. PROCEDURE: Procedure, benefits, and risks of procedure were discussed with patient. Written informed consent for procedure was obtained. Time out protocol followed. Pleural effusion localized by ultrasound at the posterior RIGHT hemithorax. Skin prepped and draped in usual sterile fashion. Skin and soft tissues anesthetized with 10 mL of 1% lidocaine. 8 French thoracentesis catheter placed into the RIGHT pleural space. 1.4 L of bright yellow to amber colored fluid aspirated by syringe pump. Procedure tolerated well by patient without immediate complication. Incidental finding: Minimal ascites perihepatic. Insufficient ascites for either diagnostic or therapeutic paracentesis. FINDINGS: A total of approximately 1.4 L of  RIGHT pleural fluid was removed. Samples were sent to the laboratory as requested by the clinical team. IMPRESSION: Successful ultrasound guided RIGHT thoracentesis yielding 1.4 L of pleural fluid. Insufficient ascites for paracentesis. Electronically Signed   By: Ulyses Southward M.D.   On: 06/01/2017 09:59  [2 weeks]   Assessment: 57 year old male presenting with decompensated cirrhosis (MELD Na 30, Child Pugh Class C) in setting of alcohol use, with concern for sepsis on admission, anasarca, pleural effusion, and acute on chronic anemia with heme positive stool. Thoracentesis completed yesterday with fluid analyses pending. No ascites available for paracentesis; no concern for SBP. Empirically started on antibiotics due to concern for sepsis on admission.   Alcoholic hepatitis with DF of greater than 50, but prednisolone not started due to concern for infection. Hepatitis B surface antigen and Hep C antibody negative last year. Labs essentially stable with exception of slight increase in PT in the last 24 hours.   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   MELD Na 30. Not a candidate for transplant with continued ETOH use.  Recent EGD on file with Grade 1 varices. Non-compliance with diuretics as outpatient. Likely DTs as etiology of his mental status changes. Ammonia level minimally elevated. He is taking his lactulose and xifaxan so far this admission. Diuretics restarted this admission. Some increase in creatinine from admission. Currently not receiving IV fluids. If unable to arouse for oral intake, would consider IV fluids.    Anemia: acute on chronic. Received 1 unit PRBCs this admission. Reports intermittent dark stool prior to admission but none this admission. Brown stool on exam in ED. Heme positive. INR elevated at 2. Monitor for overt bleeding. Anemia  multifactorial in this setting. Change protonix drip to IV BID.   Plan: 1. CMP, CBC, INR am 2. F/U pending blood and fluid cultures as available.   3. DTs management per attending. Monitor for ability of oral intake including medications. Currently patient is not receiving IV fluids.  4. Will continue to follow with you.   Leanna BattlesLeslie S. Dixon BoosLewis, PA-C [facility] [phone] 8/8/20189:35 AM     LOS: 2 days

## 2017-06-02 NOTE — Progress Notes (Signed)
Pt awoken by lab drawing blood, pt being very impulsive and swinging. Trying to get OOB. 3 people in room trying to stop pt from getting OOB, security called once again to help staff with pt. Ativan 2mg  IV given per PRN order. Pt back in bed with the help of security, now resting quietly. Pt may need stronger medication for his DTs. Will continue to monitor pt

## 2017-06-02 NOTE — Consult Note (Signed)
Consultation Note Date: 06/02/2017   Patient Name: Russell Trujillo  DOB: 05/17/1961  MRN: [id number]  Age / Sex: 56 y.o., male  PCP: Dettinger, Elige Radon, MD Referring Physician: Elliot Cousin, MD  Reason for Consultation: Establishing goals of care and Psychosocial/spiritual support  HPI/Patient Profile: 56 y.o. male  with past medical history of Alcoholic cirrhosis of the liver, anxiety and depression, esophageal varices, CHF with AICD, aseptic necrosis of right hip bone, history of ventral hernia repair 12/11/17admitted on 05/28/2017 with acute respiratory failure, alcoholic cirrhosis.   Clinical Assessment and Goals of Care: Late entry due to Epic unavailable.  Mr. Sedor is lying quietly in bed. Nursing staff is at bedside for safety. Mr. Sofia does not wake as we're talking over him. I do not touch him to try to wake him as he has been agitated throughout the night and his family resting quietly. Per RN and CNA, Mr. Mcquitty is oriented only to self.  Call to brother, Ankur Snowdon. We talk at length about Mr. Hazzard living conditions. Duane shares that his brother has been separated from his wife for 4 to 5 years, and moved in to his mother's apartment around 4 years ago. Duane also shares that their mother died 03-17-2024of this year and his brother has had lots of depression. Duane shares that Mr. Ketelsen has been "trying to get admitted (to the hospital) for depression".  We talk about Chidiebere's chronic and acute health problems. Duane shared that his brother almost died in 2023/10/06 after umbilical hernia ruptured. Duane states that they were told he had a percent chance to live and, "about lost his kidneys". Duane shares that he has asked his brother about prognosis, that Lelend only stated "longer if I stopped drinking". Duane states that Jaikob has shared "he told me he's ready to go". Duane elects no  life-support, no CPR, note intubation. Duane goes further to state "I wish there was a place you could send him, since he's so sick". I ask if he means like hospice and he states that his worry is that his mother only had one week after she went to hospice. I share with Duane that regardless of where Darald is, I don't feel like we could give him any more time than that. Duane states that over the last few days Kainon has been hallucinating, stating that objects are talking to him and he sees things that aren't there.  At this point Mr. Wettstein is unable to make his own choices. His only living relative/next of kin elects to treat the treatable, but no CPR or intubation. He is agreeable to the use of BiPAP as needed, and the use of any medications as needed.  24 to 48 hours for outcomes.  Brother Duane lives in [place], we schedule a family meeting for Thursday 8/9 at 1300.  Healthcare power of attorney NEXT OF KIN - per brother, Kaelon Weekes who lives in [place], Mr. Lindeman has been separated from his wife for the last 4  or 5 years. Mr. Darcella GasmanDodson has no children. Sharl MaMarty has no other living siblings. Their sister died 17 years ago from complications of cerebral palsy. Duane shares that Sharl MaMarty had been living with his mother, in her apartment, for the last 4 years, but she died 01/07/17 of liver cancer, spending her last week in the hospice home.   SUMMARY OF RECOMMENDATIONS   at this point Mr. Darcella GasmanDodson is unable to make his own choices. His only living relative/next of kin elects to treat the treatable, but no CPR or intubation. He is agreeable to the use of BiPAP as needed, and the use of any medications as needed. 24 to 48 hours for outcomes.  Code Status/Advance Care Planning:  DNR  Symptom Management:   per hospitalist, no additional needs at this time.  Palliative Prophylaxis:   Aspiration and Delirium Protocol  Additional Recommendations (Limitations, Scope, Preferences):  Continue to treat the  treatable but no CPR or intubation  Psycho-social/Spiritual:   Desire for further Chaplaincy support:no  Additional Recommendations: Caregiving  Support/Resources  Prognosis:   Unable to determine, 24 to 48 hours for outcomes. In-hospital death would not be surprising.  Discharge Planning: To Be Determined      Primary Diagnoses: Present on Admission: . Acute respiratory failure (HCC) . Alcoholic cirrhosis of liver with ascites (HCC) . Hypokalemia . Anemia in other chronic diseases classified elsewhere . Acute lower UTI . Esophageal varices without bleeding (HCC) . Essential hypertension, benign . Ventral hernia . Vesicorectal fistula . Hypomagnesemia . Alcohol abuse . GI bleed . Alcoholic hepatitis without ascites . AICD (automatic cardioverter/defibrillator) present . Coagulopathy (HCC) . Thrombocytopenia (HCC) . Hyperglycemia . Hyponatremia . AKI (acute kidney injury) (HCC) . Pleural effusion   I have reviewed the medical record, interviewed the patient and family, and examined the patient. The following aspects are pertinent.  Past Medical History:  Diagnosis Date  . AICD (automatic cardioverter/defibrillator) present   . Alcohol abuse 06/01/2017  . Allergy   . Anxiety   . Arthritis   . 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 CHF (congestive heart failure) (HCC)   . Cirrhosis (HCC)   . Depression   . Dyspnea   . Esophageal varices (HCC)   . Fistula    Bladder Fistula  . Gastritis and duodenitis   . GERD (gastroesophageal reflux disease)   . Gout   . Headache   . Hepatitis    ? unsure   . Hyperlipidemia   . Hypertension    Social History   Social History  . Marital status: Legally Separated    Spouse name: N/A  . Number of children: N/A  . Years of education: N/A   Social History Main Topics  . Smoking status: Current Every Day Smoker    Packs/day: 1.00    Years: 40.00    Types: Cigarettes  . Smokeless tobacco: Never Used  .  Alcohol use Yes     Comment: occ  . Drug use: Yes    Types: Marijuana  . Sexual activity: No   Other Topics Concern  . None   Social History Narrative  . None   Family History  Problem Relation Age of Onset  . Breast cancer Mother   . Depression Mother   . Diabetes Mother   . Hearing loss Mother   . Hyperlipidemia Mother   . Hypertension Mother   . Mental illness Mother   . Vision loss Mother   . Alcohol abuse Father   .  Arthritis Father   . Early death Father   . Hyperlipidemia Father   . Hypertension Father   . Vision loss Father   . Colon cancer Paternal Grandmother   . Liver cancer Cousin   . Colon polyps Neg Hx    Scheduled Meds: . fenofibrate  160 mg Oral Daily  . FLUoxetine  10 mg Oral Daily  . folic acid  1 mg Oral Daily  . furosemide  40 mg Oral BID WC  . ipratropium-albuterol  3 mL Nebulization Q6H  . lactulose  30 g Oral TID  . LORazepam  0-4 mg Intravenous Q6H   Followed by  . [START ON 06/03/2017] LORazepam  0-4 mg Intravenous Q12H  . multivitamin with minerals  1 tablet Oral Daily  . nicotine  14 mg Transdermal Daily  . pantoprazole (PROTONIX) IV  40 mg Intravenous Q12H  . rifaximin  550 mg Oral BID  . spironolactone  100 mg Oral BID WC  . thiamine  100 mg Oral Daily   Or  . thiamine  100 mg Intravenous Daily   Continuous Infusions: . magnesium sulfate    . piperacillin-tazobactam (ZOSYN)  IV Stopped (06/02/17 1020)  . potassium chloride    . vancomycin Stopped (06/02/17 1150)   PRN Meds:.LORazepam **OR** LORazepam, LORazepam Medications Prior to Admission:  Prior to Admission medications   Medication Sig Start Date End Date Taking? Authorizing Provider  aspirin EC 81 MG tablet Take 81 mg by mouth daily.   Yes [provider]  fenofibrate (TRICOR) 145 MG tablet Take 1 tablet (145 mg total) by mouth daily. 02/20/16  Yes Dettinger, Elige Radon, MD  FLUoxetine (PROZAC) 10 MG capsule Take 1 capsule (10 mg total) by mouth daily. 10/28/16  Yes  Dettinger, Elige Radon, MD  metoprolol succinate (TOPROL-XL) 25 MG 24 hr tablet TAKE 1 TABLET DAILY 08/06/16  Yes Dettinger, Elige Radon, MD  pantoprazole (PROTONIX) 40 MG tablet 1 PO 30 MINUTES PRIOR TO MEALS BID FOR 3 MOS THEN QD Patient taking differently: Take 40 mg by mouth 2 (two) times daily.  01/26/17  Yes Fields, Darleene Cleaver, MD   Allergies  Allergen Reactions  . Coreg [Carvedilol] Swelling    Swelling of lips   Review of Systems  Unable to perform ROS: Acuity of condition    Physical Exam  Constitutional:  Appears acutely/chronically ill  HENT:  Head: Atraumatic.  Cardiovascular: Normal rate.   Pulmonary/Chest:  Accessory muscle use, work of breathing noted  Abdominal: Soft. He exhibits distension.  Musculoskeletal: He exhibits edema.  Neurological:  Encephalopathy, unable to determine orientation at this time  Skin: Skin is warm and dry.  Jaundiced, healed midline abdominal incision  Nursing note and vitals reviewed.   Vital Signs: BP (!) 112/50   Pulse (!) 57   Temp 98.7 F (37.1 C) (Axillary)   Resp (!) 22   Ht 5\' 8"  (1.727 m)   Wt 127.6 kg (281 lb 4.9 oz)   SpO2 93%   BMI 42.77 kg/m  Pain Assessment: PAINAD POSS *See Group Information*: 1-Acceptable,Awake and alert Pain Score: 0-No pain   SpO2: SpO2: 93 % O2 Device:SpO2: 93 % O2 Flow Rate: .O2 Flow Rate (L/min): 4 L/min  IO: Intake/output summary:  Intake/Output Summary (Last 24 hours) at 06/02/17 1251 Last data filed at 06/02/17 1200  Gross per 24 hour  Intake             2470 ml  Output  975 ml  Net             1495 ml    LBM: Last BM Date: 06/02/17 Baseline Weight: Weight: 122 kg (269 lb) Most recent weight: Weight: 127.6 kg (281 lb 4.9 oz)     Palliative Assessment/Data:   Flowsheet Rows     Most Recent Value  Intake Tab  Referral Department  Gastroenterology  Unit at Time of Referral  Med/Surg Unit  Date Notified  06/01/17  Palliative Care Type  New Palliative care  Reason for  referral  Clarify Goals of Care, Advance Care Planning  Date of Admission  Jun 28, 2017  Date first seen by Palliative Care  06/02/17  # of days Palliative referral response time  1 Day(s)  # of days IP prior to Palliative referral  1  Clinical Assessment  Palliative Performance Scale Score  20%  Pain Max last 24 hours  Not able to report  Pain Min Last 24 hours  Not able to report  Dyspnea Max Last 24 Hours  Not able to report  Dyspnea Min Last 24 hours  Not able to report  Psychosocial & Spiritual Assessment  Palliative Care Outcomes  Patient/Family meeting held?  Yes  Who was at the meeting?  Brother Duane via phone  Palliative Care Outcomes  Counseled regarding hospice, Provided advance care planning, Changed CPR status, Provided psychosocial or spiritual support, Clarified goals of care  Patient/Family wishes: Interventions discontinued/not started   Mechanical Ventilation      Time In: 1230 Time Out: 1325 Time Total: 55 minutes Greater than 50%  of this time was spent counseling and coordinating care related to the above assessment and plan.  Signed by: Katheran Awe, NP   Please contact Palliative Medicine Team phone at [phone] for questions and concerns.  For individual provider: See Loretha Stapler

## 2017-06-02 NOTE — Progress Notes (Signed)
Unable to give any PO meds this AM due to patients mental status and agitation.  MD aware.

## 2017-06-02 NOTE — Progress Notes (Signed)
Dr. Onalee Huaavid paged and made aware of pt still trying to get OOB. Security paged again d/t pt refusing to get in bed and trying to make himself fall on floor. Adv Dr.David that Ativan has had no help with pts CIWA except opposite effect and asked for Haldol. Waiting for orders.

## 2017-06-02 NOTE — Progress Notes (Signed)
New order for Potassium 10meq x3 as well as one dose of magnesium. Dr. Toniann Failkakrakandy also made aware of ammonia level of 83.  New orders to change lactulose to 30mg  TID, give one dose now. Will continue to monitor pt.

## 2017-06-02 NOTE — Progress Notes (Signed)
PROGRESS NOTE    Russell Trujillo  ZOX:[id number] DOB: 1961-02-04 DOA: 06/16/2017 PCP: Russell Trujillo    Brief Narrative:  Patient is a 56 year old man with a history of alcohol-induced cirrhosis, portal hypertensive gastropathy, grade 1 esophageal there are SCDs, gastritis/duodenitis, ongoing alcohol abuse, chronic CHF, status post AICD implantation, recent ventral hernia repair, and a vesicorectal fistula. He presented to the ED on 06/18/2017 with increasing shortness of breath, lower extremity swelling, and increased abdominal swelling. In the ED, he was afebrile with blood pressures in the 110s to 100s. Satting in the upper 90s on room air. Notable labs include a white count of 11, hemoglobin 8.4, platelets of 145, sodium of 129, potassium 2.5, calcium 8.3, albumin 2.1, T bili of 14.9 ethanol level of 44, fecal occult blood positive, and INR 2.0. Noncontrasted head CT revealed no acute intracranial abnormalities. Chest x-ray revealed pulmonary vascular congestion and moderate right pleural effusion. He was admitted for further evaluation and management.   Assessment & Plan:   Principal Problem:   Acute respiratory failure (HCC) Active Problems:   Vesicorectal fistula   Alcoholic cirrhosis of liver with ascites (HCC)   Acute lower UTI   Alcoholic hepatitis without ascites   Pleural effusion   Essential hypertension, benign   AICD (automatic cardioverter/defibrillator) present   Ventral hernia   AKI (acute kidney injury) (HCC)   Anemia in other chronic diseases classified elsewhere   Hyponatremia   Esophageal varices without bleeding (HCC)   Hypokalemia   Hypomagnesemia   Alcohol abuse   GI bleed   Coagulopathy (HCC)   Thrombocytopenia (HCC)   Hyperglycemia   Patient is a 56 year old man with decompensated alcohol-induced cirrhosis who was admitted with acute respiratory failure with hypoxia in the setting of anasarca and right pleural effusion. He is status post  thoracentesis which yielded 1.4 L of fluid. He is on Zosyn for UTI. Gastroenterology was consulted and is following. Due to his poor prognosis, palliative care consult was ordered. Palliative care NP, Russell Trujillo discussed CODE STATUS with the patient's brother and he was in agreement for a DO NOT RESUSCITATE status. The dictating physician and gastroenterology are in agreement that the patient should be a DNR as his prognosis is very poor. Russell Trujillo will meet with his family in person on 06/03/17.  -Patient had some mild wheezing on exam. Bronchodilators given. Chest x-ray revealed increased vascular congestion. -Diuretics changed to Aldactone 100 mg twice a day and Lasix 40 mg twice a day. -Continue supportive treatment and antibiotic therapy with Zosyn and vancomycin. -Continue CIWA protocol. -Potassium chloride 3 given intravenously. Continue to monitor serum potassium and magnesium levels.  DVT prophylaxis: INR equal to or greater than 2; SCDs Code Status: Now DO NOT RESUSCITATE as of 06/02/17. Family Communication: Family not available Disposition Plan: Discharge when clinically appropriate   Consultants:   Palliative care  Gastroenterology  Procedures:   Ultrasound-guided thoracentesis 06/01/2017 with 1400 mL of amber colored pleural fluid removed.  1 unit packed red blood cells 06/03/2017  Antimicrobials:  IV Zosyn 06/05/2017  IV vancomycin 06/23/2017   Subjective: (Delayed entry). Nursing reported that the patient was very agitated this morning. He was given 2 mg of Ativan IV and was nearly obtunded when I entered the room. He was snoring and in no acute distress.  Objective: Vitals:   06/02/17 0413 06/02/17 1054 06/02/17 1056 06/02/17 1132  BP:  (!) 112/50    Pulse:  (!) 57    Resp:  Marland Kitchen(!)  22    Temp:      TempSrc:      SpO2:  (!) 86% 93% 93%  Weight: 127.6 kg (281 lb 4.9 oz)     Height:        Intake/Output Summary (Last 24 hours) at 06/02/17 1228 Last data filed  at 06/02/17 0620  Gross per 24 hour  Intake             2270 ml  Output              950 ml  Net             1320 ml   Filed Weights   06/02/2017 1529 06/12/2017 2114 06/02/17 0413  Weight: 122 kg (269 lb) 128.7 kg (283 lb 12.8 oz) 127.6 kg (281 lb 4.9 oz)    Examination:  General exam: Appears calm and comfortable; lethargic/obtunded. Respiratory system: Globally decreased breath sounds with breakthrough fine wheezes. Respiratory effort normal. Cardiovascular system: S1 & S2 heard, RRR. No JVD, murmurs, rubs, gallops or clicks. 2-3+ bilateral lower extremity edema. Gastrointestinal system: Abdomen is obese, large, mildly distended. nondistended, soft and nontender. Organomegaly or masses difficult to assess due to the obesity. Normal bowel sounds heard. Well-healed vertical abdominal scar. Central nervous system: Alert and oriented. No focal neurological deficits. Extremities: No acute hot red joints. Skin: Mild erythematous pretibial areas with a few excoriations Psychiatry: Judgement and insight appear impaired. The patient is lethargic secondary to Ativan.    Data Reviewed: I have personally reviewed following labs and imaging studies  CBC:  Recent Labs Lab 06/21/2017 1611 06/01/17 0424 06/02/17 0538 06/02/17 0956  WBC 11.1* 10.6* 14.7* 10.4  NEUTROABS 8.6*  --  11.5*  --   HGB 8.4* 8.7* 8.3* 10.9*  HCT 23.8* 25.7* 24.5* 33.6*  MCV 94.8 93.5 94.6 89.4  PLT 145* 140* 123* 314   Basic Metabolic Panel:  Recent Labs Lab 06/07/2017 1611 06/01/17 0424 06/01/17 2300 06/02/17 0538 06/02/17 0956  NA 129* 132* 130* 132* 140  K 2.5* 2.7* 3.2* 3.5 3.4*  CL 93* 95* 95* 96* 105  CO2 22 26 21* 25 24  GLUCOSE 87 86 103* 108* 171*  BUN 9 8 11 12 19   CREATININE 1.28* 1.19 1.76* 1.79* 0.66  CALCIUM 8.3* 8.0* 7.6* 7.6* 9.8  MG 1.0* 1.1* 1.5*  --   --    GFR: Estimated Creatinine Clearance: 134.3 mL/min (by C-G formula based on SCr of 0.66 mg/dL). Liver Function Tests:  Recent  Labs Lab 06/24/2017 1611 06/01/17 0424 06/02/17 0538 06/02/17 0956  AST 121* 115* 117* 26  ALT 34 34 31 36  ALKPHOS 138* 127* 102 101  BILITOT 14.9* 14.9* 15.0* 0.5  PROT 7.9 7.7 7.0 6.6  ALBUMIN 2.1* 2.0* 2.0* 2.9*    Recent Labs Lab 06/02/17 0610  LIPASE 42    Recent Labs Lab 06/11/2017 1617 06/01/17 2300 06/02/17 0538  AMMONIA 55* 83* 57*   Coagulation Profile:  Recent Labs Lab 06/07/2017 1611 06/01/17 0424 06/02/17 0538  INR 2.01 2.00 2.40   Cardiac Enzymes:  Recent Labs Lab 06/09/2017 1611  TROPONINI <0.03   BNP (last 3 results) No results for input(s): PROBNP in the last 8760 hours. HbA1C: No results for input(s): HGBA1C in the last 72 hours. CBG:  Recent Labs Lab 06/02/2017 2117  GLUCAP 115*   Lipid Profile: No results for input(s): CHOL, HDL, LDLCALC, TRIG, CHOLHDL, LDLDIRECT in the last 72 hours. Thyroid Function Tests: No results for input(s): TSH,  T4TOTAL, FREET4, T3FREE, THYROIDAB in the last 72 hours. Anemia Panel: No results for input(s): VITAMINB12, FOLATE, FERRITIN, TIBC, IRON, RETICCTPCT in the last 72 hours. Sepsis Labs: No results for input(s): PROCALCITON, LATICACIDVEN in the last 168 hours.  Recent Results (from the past 240 hour(s))  Urine culture     Status: Abnormal (Preliminary result)   Collection Time: 06/11/2017  5:40 PM  Result Value Ref Range Status   Specimen Description URINE, CLEAN CATCH  Final   Special Requests NONE  Final   Culture >=100,000 COLONIES/mL GRAM NEGATIVE RODS (A)  Final   Report Status PENDING  Incomplete  Gram stain     Status: None   Collection Time: 06/01/17  9:19 AM  Result Value Ref Range Status   Specimen Description PLEURAL  Final   Special Requests NONE  Final   Gram Stain   Final    CYTOSPIN SMEAR NO ORGANISMS SEEN WBC SEEN WBC PRESENT,BOTH PMN AND MONONUCLEAR    Report Status 06/01/2017 FINAL  Final  Culture, body fluid-bottle     Status: None (Preliminary result)   Collection Time:  06/01/17  9:19 AM  Result Value Ref Range Status   Specimen Description PLEURAL COLLECTED BY DOCTOR  Final   Special Requests BOTTLES DRAWN AEROBIC AND ANAEROBIC 10 CC EACH  Final   Culture NO GROWTH < 24 HOURS  Final   Report Status PENDING  Incomplete  Culture, blood (Routine X 2) w Reflex to ID Panel     Status: None (Preliminary result)   Collection Time: 06/01/17 12:21 PM  Result Value Ref Range Status   Specimen Description BLOOD RIGHT HAND  Final   Special Requests   Final    BOTTLES DRAWN AEROBIC ONLY Blood Culture adequate volume   Culture NO GROWTH < 24 HOURS  Final   Report Status PENDING  Incomplete  Culture, blood (Routine X 2) w Reflex to ID Panel     Status: None (Preliminary result)   Collection Time: 06/01/17 12:44 PM  Result Value Ref Range Status   Specimen Description BLOOD LEFT ARM  Final   Special Requests   Final    BOTTLES DRAWN AEROBIC AND ANAEROBIC Blood Culture adequate volume   Culture NO GROWTH < 24 HOURS  Final   Report Status PENDING  Incomplete         Radiology Studies: Dg Chest 1 View  Result Date: 06/01/2017 CLINICAL DATA:  Status post thoracentesis. EXAM: CHEST 1 VIEW COMPARISON:  One day prior FINDINGS: Degraded exam secondary to patient body habitus and AP portable technique. Apical lordotic positioning. Midline trachea. Cardiomegaly accentuated by AP portable technique. Decreased right pleural effusion. No pneumothorax. Pulmonary venous congestion, accentuated by AP portable technique. Improved right base atelectasis. IMPRESSION: Decreased right-sided pleural effusion, without evidence of pneumothorax. Cardiomegaly with mild pulmonary venous congestion and mild right base atelectasis. Decreased sensitivity and specificity exam due to technique related factors, as described above. Electronically Signed   By: Jeronimo Greaves M.D.   On: 06/01/2017 09:58   Dg Chest 2 View  Result Date: 06/16/2017 CLINICAL DATA:  Shortness of breath. EXAM: CHEST  2 VIEW  COMPARISON:  Radiograph of September 24, 2016. FINDINGS: Stable cardiomediastinal silhouette. Atherosclerosis of thoracic aorta is noted. Left-sided pacemaker is unchanged in position. No pneumothorax is noted. Left lung is clear. Stable mild central pulmonary vascular congestion is noted. Moderate right pleural effusion is noted with probable underlying atelectasis or infiltrate. Bony thorax is unremarkable. IMPRESSION: Mild central pulmonary vascular congestion. Aortic  atherosclerosis. Moderate right pleural effusion with probable underlying atelectasis or infiltrate. Electronically Signed   By: Lupita Raider, M.D.   On: Jun 27, 2017 17:42   Ct Head Wo Contrast  Result Date: 2017/06/27 CLINICAL DATA:  Increased shortness of breath and abdominal swelling for 1 week, altered level of consciousness, hypertension, CHF, cirrhosis, smoker EXAM: CT HEAD WITHOUT CONTRAST TECHNIQUE: Contiguous axial images were obtained from the base of the skull through the vertex without intravenous contrast. Sagittal and coronal MPR images reconstructed from axial data set. COMPARISON:  None FINDINGS: Brain: Minimal atrophy. Normal ventricular morphology. No midline shift or mass effect. Otherwise normal appearance of brain parenchyma. No intracranial hemorrhage, mass lesion or evidence acute infarction. No extra-axial fluid collections. Vascular: Atherosclerotic calcifications at the internal carotid arteries at the skullbase Skull: Intact Sinuses/Orbits: Clear paranasal sinuses and LEFT mastoid air cells. Prior RIGHT mastoidectomy. Other: N/A IMPRESSION: No acute intracranial abnormalities. Prominent atherosclerotic calcifications at the internal carotid arteries bilaterally at skullbase for age. Electronically Signed   By: Ulyses Southward M.D.   On: 06/27/17 17:26   US Thoracentesis Asp Pleural Space W/img Guide  Result Date: 06/01/2017 INDICATION: Alcoholic cirrhosis, shortness of breath, RIGHT pleural effusion EXAM: ULTRASOUND  GUIDED DIAGNOSTIC AND THERAPEUTIC RIGHT THORACENTESIS MEDICATIONS: None. COMPLICATIONS: None immediate. PROCEDURE: Procedure, benefits, and risks of procedure were discussed with patient. Written informed consent for procedure was obtained. Time out protocol followed. Pleural effusion localized by ultrasound at the posterior RIGHT hemithorax. Skin prepped and draped in usual sterile fashion. Skin and soft tissues anesthetized with 10 mL of 1% lidocaine. 8 French thoracentesis catheter placed into the RIGHT pleural space. 1.4 L of bright yellow to amber colored fluid aspirated by syringe pump. Procedure tolerated well by patient without immediate complication. Incidental finding: Minimal ascites perihepatic. Insufficient ascites for either diagnostic or therapeutic paracentesis. FINDINGS: A total of approximately 1.4 L of RIGHT pleural fluid was removed. Samples were sent to the laboratory as requested by the clinical team. IMPRESSION: Successful ultrasound guided RIGHT thoracentesis yielding 1.4 L of pleural fluid. Insufficient ascites for paracentesis. Electronically Signed   By: Ulyses Southward M.D.   On: 06/01/2017 09:59        Scheduled Meds: . fenofibrate  160 mg Oral Daily  . FLUoxetine  10 mg Oral Daily  . folic acid  1 mg Oral Daily  . furosemide  40 mg Oral BID WC  . ipratropium-albuterol  3 mL Nebulization Q6H  . lactulose  30 g Oral TID  . LORazepam  0-4 mg Intravenous Q6H   Followed by  . [START ON 06/03/2017] LORazepam  0-4 mg Intravenous Q12H  . multivitamin with minerals  1 tablet Oral Daily  . nicotine  14 mg Transdermal Daily  . pantoprazole (PROTONIX) IV  40 mg Intravenous Q12H  . rifaximin  550 mg Oral BID  . spironolactone  100 mg Oral BID WC  . thiamine  100 mg Oral Daily   Or  . thiamine  100 mg Intravenous Daily   Continuous Infusions: . magnesium sulfate    . piperacillin-tazobactam (ZOSYN)  IV Stopped (06/02/17 1020)  . potassium chloride    . vancomycin Stopped  (06/02/17 1150)     LOS: 2 days    Time spent: 35 minutes    Elliot Cousin, Trujillo [facility] Pager [phone]  If 7PM-7AM, please contact night-coverage [email or web address] Password [facility] 06/02/2017, 12:28 PM

## 2017-06-02 NOTE — Plan of Care (Signed)
Problem: Education: Goal: Knowledge of disease or condition will improve Outcome: Not Progressing RN educated pt multiple times this shift on alcohol withdrawal, s/s, and medication available to help him. Pt stated at beginning of shift he had been through DTs before but wasn't having them again because he "doesn't drink as much as he used to." Pt has been given Ativan multiple times in attempt to decrease his DTs. Dr. Onalee Huaavid has been made aware on numerous occasions of pt status. Will continue to monitor and medicate pt per MD orders.

## 2017-06-02 NOTE — Progress Notes (Signed)
After speaking to Dr. Toniann FailKakrakandy, new order for Ativan 1mg  x1 now and ammonia level to be drawn. RN spoke with lab. Will continue to monitor pt

## 2017-06-02 NOTE — Progress Notes (Signed)
Dr. Onalee Huaavid paged and made aware of pts respiratory status, labored breathing and expiratory wheezing. Pt placed on 3L O2 per [place], refusing at first but now wearing oxygen. RT bridgette called and made aware of changed and RN requested ABG d/t changes. RT coming to assess pt. Waiting to hear back from Dr. Onalee HuaAvid

## 2017-06-02 NOTE — Progress Notes (Signed)
RN had to call security on pt d/t pt getting OOB and refusing to get back in bed. Pt stated this is not his home and he is in [place]". Pt very unsteady on his feet and also urinated on the floor and tried walking in the urine. Pt educated on safety precautions and why he needed to stay in the bed. Pt continued to refuse even after RN adv him that security would be called. After security arrived, pt back in bed with bed alarm on. Bed in lowest locked position, call bell within reach, SR up x3. Will continue to monitor pt.  [facility] and made aware that orders were not transferring over to pyxis. Adv that lactulose order was not changed in pyxis as well as potassium and magnesium orders were not crossing over either. Thurston Holenne verified pts acct # and stated she did not know what the problem is and to override the potassium and magnesium.

## 2017-06-02 NOTE — Progress Notes (Signed)
After receiving second dose of Ativan 1mg , pt continues to get OOB. Bed alarm on again, bed in lowest locked position, call bell within reach. Pt more confused and [place] [facility]. Pt requesting to sit on loveseat and wants to walk around in the room. RN adv pt why he could not do that d/t him being a high fall risk and unsteady on his feet. Pt unaware of surrounding and states he does not want to get back in bed. Dr. Toniann FailKakrakandy paged and made aware of this as well as pts potassium level of 3.2 and magnesium level of 1.5. Waiting for orders.

## 2017-06-02 NOTE — Progress Notes (Signed)
Patient very combative and agitated this AM.  Confused.  CIWA score elevated. Has received 2 doses of IV ativan.  Now resting better after second dose.  More exp wheezing noted than from early morning assessment.  O2 sats 86% on 3L O2 [place].  Increased to 4L and now sat at 93%.  Dr Sherrie MustacheFisher paged and made aware via text page.  Will continue to monitor.  Safety sitter at bedside

## 2017-06-03 DIAGNOSIS — Z7189 Other specified counseling: Secondary | ICD-10-CM

## 2017-06-03 DIAGNOSIS — K7469 Other cirrhosis of liver: Secondary | ICD-10-CM

## 2017-06-03 DIAGNOSIS — Z9889 Other specified postprocedural states: Secondary | ICD-10-CM

## 2017-06-03 DIAGNOSIS — J90 Pleural effusion, not elsewhere classified: Secondary | ICD-10-CM

## 2017-06-03 DIAGNOSIS — Z515 Encounter for palliative care: Secondary | ICD-10-CM

## 2017-06-03 LAB — COMPREHENSIVE METABOLIC PANEL
ALBUMIN: 2 g/dL — AB (ref 3.5–5.0)
ALT: 37 U/L (ref 17–63)
ANION GAP: 10 (ref 5–15)
AST: 128 U/L — ABNORMAL HIGH (ref 15–41)
Alkaline Phosphatase: 106 U/L (ref 38–126)
BILIRUBIN TOTAL: 15.6 mg/dL — AB (ref 0.3–1.2)
BUN: 17 mg/dL (ref 6–20)
CALCIUM: 7.5 mg/dL — AB (ref 8.9–10.3)
CO2: 25 mmol/L (ref 22–32)
Chloride: 98 mmol/L — ABNORMAL LOW (ref 101–111)
Creatinine, Ser: 1.46 mg/dL — ABNORMAL HIGH (ref 0.61–1.24)
GFR, EST NON AFRICAN AMERICAN: 52 mL/min — AB (ref >60)
GLUCOSE: 77 mg/dL (ref 65–99)
POTASSIUM: 3.4 mmol/L — AB (ref 3.5–5.1)
Sodium: 133 mmol/L — ABNORMAL LOW (ref 135–145)
TOTAL PROTEIN: 7.4 g/dL (ref 6.5–8.1)

## 2017-06-03 LAB — URINE CULTURE

## 2017-06-03 LAB — BASIC METABOLIC PANEL
Anion gap: 11 (ref 5–15)
BUN: 16 mg/dL (ref 6–20)
CO2: 26 mmol/L (ref 22–32)
CREATININE: 1.65 mg/dL — AB (ref 0.61–1.24)
Calcium: 7.7 mg/dL — ABNORMAL LOW (ref 8.9–10.3)
Chloride: 98 mmol/L — ABNORMAL LOW (ref 101–111)
GFR calc Af Amer: 52 mL/min — ABNORMAL LOW (ref >60)
GFR, EST NON AFRICAN AMERICAN: 45 mL/min — AB (ref >60)
Glucose, Bld: 84 mg/dL (ref 65–99)
Potassium: 3 mmol/L — ABNORMAL LOW (ref 3.5–5.1)
SODIUM: 135 mmol/L (ref 135–145)

## 2017-06-03 LAB — CBC
HCT: 27.3 % — ABNORMAL LOW (ref 39.0–52.0)
Hemoglobin: 9.2 g/dL — ABNORMAL LOW (ref 13.0–17.0)
MCH: 32.4 pg (ref 26.0–34.0)
MCHC: 33.7 g/dL (ref 30.0–36.0)
MCV: 95.4 fL (ref 78.0–100.0)
PLATELETS: 132 10*3/uL — AB (ref 150–400)
RBC: 2.84 MIL/uL — ABNORMAL LOW (ref 4.22–5.81)
RDW: 26.1 % — AB (ref 11.5–15.5)
WBC: 11.9 10*3/uL — AB (ref 4.0–10.5)

## 2017-06-03 LAB — VANCOMYCIN, TROUGH: Vancomycin Tr: 34 ug/mL (ref 15–20)

## 2017-06-03 LAB — HEPATIC FUNCTION PANEL
ALK PHOS: 113 U/L (ref 38–126)
ALT: 34 U/L (ref 17–63)
AST: 119 U/L — ABNORMAL HIGH (ref 15–41)
Albumin: 2 g/dL — ABNORMAL LOW (ref 3.5–5.0)
BILIRUBIN DIRECT: 7.8 mg/dL — AB (ref 0.1–0.5)
BILIRUBIN INDIRECT: 7 mg/dL — AB (ref 0.3–0.9)
TOTAL PROTEIN: 7.3 g/dL (ref 6.5–8.1)
Total Bilirubin: 14.8 mg/dL — ABNORMAL HIGH (ref 0.3–1.2)

## 2017-06-03 LAB — MAGNESIUM: Magnesium: 1.9 mg/dL (ref 1.7–2.4)

## 2017-06-03 LAB — LIPASE, BLOOD: LIPASE: 48 U/L (ref 11–51)

## 2017-06-03 LAB — AMMONIA: Ammonia: 75 umol/L — ABNORMAL HIGH (ref 9–35)

## 2017-06-03 LAB — PROTIME-INR
INR: 1.95
Prothrombin Time: 22.6 seconds — ABNORMAL HIGH (ref 11.4–15.2)

## 2017-06-03 MED ORDER — POTASSIUM CHLORIDE 10 MEQ/100ML IV SOLN
10.0000 meq | INTRAVENOUS | Status: AC
  Administered 2017-06-03: 10 meq via INTRAVENOUS
  Filled 2017-06-03: qty 100

## 2017-06-03 MED ORDER — ALBUTEROL SULFATE (2.5 MG/3ML) 0.083% IN NEBU: 2.5000 mg | INHALATION_SOLUTION | Freq: Four times a day (QID) | RESPIRATORY_TRACT | Status: DC | PRN

## 2017-06-03 MED ORDER — POTASSIUM CHLORIDE 10 MEQ/100ML IV SOLN
10.0000 meq | INTRAVENOUS | Status: AC
  Administered 2017-06-03 (×2): 10 meq via INTRAVENOUS
  Filled 2017-06-03 (×2): qty 100

## 2017-06-03 MED ORDER — IPRATROPIUM-ALBUTEROL 0.5-2.5 (3) MG/3ML IN SOLN
3.0000 mL | Freq: Two times a day (BID) | RESPIRATORY_TRACT | Status: DC
  Administered 2017-06-03 (×2): 3 mL via RESPIRATORY_TRACT
  Filled 2017-06-03 (×3): qty 3

## 2017-06-03 NOTE — Progress Notes (Signed)
Subjective:  Sitter at bedside. Patient responsive (moans) to physical stimuli. Multiple BM yesterday. No overt GI bleeding. Unable to take orally over the last 24 hours.  Objective: Vital signs in last 24 hours: Temp:  [97.6 F (36.4 C)-98.5 F (36.9 C)] 97.6 F (36.4 C) (08/09 0801) Pulse Rate:  [57-89] 73 (08/09 0801) Resp:  [20-26] 23 (08/09 0801) BP: (99-140)/(50-102) 114/60 (08/09 0801) SpO2:  [86 %-97 %] 97 % (08/09 0801) Weight:  [282 lb 6.6 oz (128.1 kg)] 282 lb 6.6 oz (128.1 kg) (08/09 0600) Last BM Date: 06/02/17 General:   Somnolent, responds minimally to physical stimuli. Moans when calling his name and rubbing his sternum.  Head:  Normocephalic and atraumatic. Eyes:  Sclera clear, positive icterus Chest: CTA bilaterally without rales, rhonchi, crackles.    Heart:  Regular rate and rhythm; no murmurs, clicks, rubs,  or gallops. Abdomen:  Soft, distended, jaundice  Normal bowel sounds, without guarding, and without rebound.   Extremities:  Without clubbing, deformity. Trace edema. Neurologic:  Somnolent  Skin:  Intact without significant lesions or rashes. Positive jaundice Psych:  Unable to evaluate   Intake/Output from previous day: 08/08 0701 - 08/09 0700 In: 250 [IV Piggyback:250] Out: 25 [Urine:25] Intake/Output this shift: No intake/output data recorded.  Lab Results: CBC  Recent Labs  06/02/17 0538 06/02/17 0956 06/03/17 0454  WBC 14.7* 10.4 11.9*  HGB 8.3* 10.9* 9.2*  HCT 24.5* 33.6* 27.3*  MCV 94.6 89.4 95.4  PLT 123* 314 132*   BMET  Recent Labs  06/02/17 0538 06/02/17 0956 06/03/17 0454  NA 132* 140 135  K 3.5 3.4* 3.0*  CL 96* 105 98*  CO2 25 24 26   GLUCOSE 108* 171* 84  BUN 12 19 16   CREATININE 1.79* 0.66 1.65*  CALCIUM 7.6* 9.8 7.7*   LFTs  Recent Labs  06/01/17 0424 06/02/17 0538 06/02/17 0956  BILITOT 14.9* 15.0* 0.5  ALKPHOS 127* 102 101  AST 115* 117* 26  ALT 34 31 36  PROT 7.7 7.0 6.6  ALBUMIN 2.0* 2.0* 2.9*     Recent Labs  06/02/17 0610  LIPASE 42   PT/INR  Recent Labs  06/01/17 0424 06/02/17 0538 06/03/17 0454  LABPROT 22.9* 26.6* 22.6*  INR 2.00 2.40 1.95      Imaging Studies: Dg Chest 1 View  Result Date: 06/01/2017 CLINICAL DATA:  Status post thoracentesis. EXAM: CHEST 1 VIEW COMPARISON:  One day prior FINDINGS: Degraded exam secondary to patient body habitus and AP portable technique. Apical lordotic positioning. Midline trachea. Cardiomegaly accentuated by AP portable technique. Decreased right pleural effusion. No pneumothorax. Pulmonary venous congestion, accentuated by AP portable technique. Improved right base atelectasis. IMPRESSION: Decreased right-sided pleural effusion, without evidence of pneumothorax. Cardiomegaly with mild pulmonary venous congestion and mild right base atelectasis. Decreased sensitivity and specificity exam due to technique related factors, as described above. Electronically Signed   By: Jeronimo GreavesKyle  Talbot M.D.   On: 06/01/2017 09:58   Dg Chest 2 View  Result Date: 06/18/2017 CLINICAL DATA:  Shortness of breath. EXAM: CHEST  2 VIEW COMPARISON:  Radiograph of September 24, 2016. FINDINGS: Stable cardiomediastinal silhouette. Atherosclerosis of thoracic aorta is noted. Left-sided pacemaker is unchanged in position. No pneumothorax is noted. Left lung is clear. Stable mild central pulmonary vascular congestion is noted. Moderate right pleural effusion is noted with probable underlying atelectasis or infiltrate. Bony thorax is unremarkable. IMPRESSION: Mild central pulmonary vascular congestion. Aortic atherosclerosis. Moderate right pleural effusion with probable underlying atelectasis or infiltrate. Electronically  Signed   By: Lupita Raider, M.D.   On: 06/21/2017 17:42   Ct Head Wo Contrast  Result Date: 06/25/2017 CLINICAL DATA:  Increased shortness of breath and abdominal swelling for 1 week, altered level of consciousness, hypertension, CHF, cirrhosis, smoker  EXAM: CT HEAD WITHOUT CONTRAST TECHNIQUE: Contiguous axial images were obtained from the base of the skull through the vertex without intravenous contrast. Sagittal and coronal MPR images reconstructed from axial data set. COMPARISON:  None FINDINGS: Brain: Minimal atrophy. Normal ventricular morphology. No midline shift or mass effect. Otherwise normal appearance of brain parenchyma. No intracranial hemorrhage, mass lesion or evidence acute infarction. No extra-axial fluid collections. Vascular: Atherosclerotic calcifications at the internal carotid arteries at the skullbase Skull: Intact Sinuses/Orbits: Clear paranasal sinuses and LEFT mastoid air cells. Prior RIGHT mastoidectomy. Other: N/A IMPRESSION: No acute intracranial abnormalities. Prominent atherosclerotic calcifications at the internal carotid arteries bilaterally at skullbase for age. Electronically Signed   By: Ulyses Southward M.D.   On: 06/12/2017 17:26   Dg Chest Port 1 View  Result Date: 06/02/2017 CLINICAL DATA:  Shortness of Breath EXAM: PORTABLE CHEST 1 VIEW COMPARISON:  06/01/2017 FINDINGS: Cardiac shadow remains enlarged. A defibrillator is again seen stable. Increasing right basilar infiltrate is noted. Vascular congestion is increased as well. No sizable effusion is seen. No bony abnormality is noted. IMPRESSION: Increase in central vascular congestion and right basilar infiltrative density. Electronically Signed   By: Alcide Clever M.D.   On: 06/02/2017 12:41   US Thoracentesis Asp Pleural Space W/img Guide  Result Date: 06/01/2017 INDICATION: Alcoholic cirrhosis, shortness of breath, RIGHT pleural effusion EXAM: ULTRASOUND GUIDED DIAGNOSTIC AND THERAPEUTIC RIGHT THORACENTESIS MEDICATIONS: None. COMPLICATIONS: None immediate. PROCEDURE: Procedure, benefits, and risks of procedure were discussed with patient. Written informed consent for procedure was obtained. Time out protocol followed. Pleural effusion localized by ultrasound at the  posterior RIGHT hemithorax. Skin prepped and draped in usual sterile fashion. Skin and soft tissues anesthetized with 10 mL of 1% lidocaine. 8 French thoracentesis catheter placed into the RIGHT pleural space. 1.4 L of bright yellow to amber colored fluid aspirated by syringe pump. Procedure tolerated well by patient without immediate complication. Incidental finding: Minimal ascites perihepatic. Insufficient ascites for either diagnostic or therapeutic paracentesis. FINDINGS: A total of approximately 1.4 L of RIGHT pleural fluid was removed. Samples were sent to the laboratory as requested by the clinical team. IMPRESSION: Successful ultrasound guided RIGHT thoracentesis yielding 1.4 L of pleural fluid. Insufficient ascites for paracentesis. Electronically Signed   By: Ulyses Southward M.D.   On: 06/01/2017 09:59  [2 weeks]   Assessment:  56 year old male presenting with decompensated cirrhosis (MELD Na 30, Child Pugh Class C) in setting of alcohol use, with concern for sepsis on admission, anasarca, pleural effusion, and acute on chronic anemia with heme positive stool. Thoracentesis completed yesterday with fluid analyses pending. No ascites available for paracentesis; no concern for SBP. Empirically started on antibiotics due to concern for sepsis on admission. Urine culture: Klebsiella Pneumonia (ampicillin resistant) and Ecoli. Both sensitive to Zosyn.  Alcoholic hepatitis with DF of greater than 50, but prednisolone not started due to concern for infection. Hepatitis B surface antigen and Hep C antibody negative last year. Labs essentially stable with exception of slight increase in PT in the last 24 hours.   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   MELD Na 30. Not a candidate for transplant with continued ETOH use.  Recent EGD on file with Grade 1 varices. Non-compliance with diuretics  as outpatient. Likely DTs as etiology of his mental status changes. Ammonia level minimally elevated. He was taking his  lactulose and xifaxan but none taken in last 24 hours with mental status changes. Multiple BMs in past 24 hours. Diuretics restarted this admission but again has not taken in 24 hours. Some increase in creatinine from admission. Currently not receiving IV fluids. If unable to arouse for oral intake, would consider IV fluids.    Anemia: acute on chronic. Received 1 unit PRBCs this admission. Reports intermittent dark stool prior to admission but none this admission.Brown stool on exam in ED.Heme positive. No overt GI bleeding this admission.  Mental status changes: Head CT on admission without acute disease. Suspected DTs. Notably, patient somnolent on exam, last ativan 6 hours prior. Continue to monitor. Sitter at bedside.   Plan: 1.  I HAVE CALLED THE LAB. THERE ARE LABS REPORTED AT 9:56 YESTERDAY THAT ARE NOT DONE FROM BLOOD ON THIS PATIENT GIVEN CLEAR DISCREPANCY IN RESULTS (IE NOW WITH NORMAL BILIRUBIN, PLATELET, CREATINE). LAB INVESTIGATING ERROR AND TO CREDIT PATIENT ACCOUNT, AND ADDEND REPORT. LFTs, lipase pending for today. 2. Appreciate palliative consult. Plan for DNR but treat the treatable for now.  3. Given numerous stools in last 24 hours, will hold off rectal lactulose.  4. Hold on prednisolone in setting of infection.  5. F/u pending labs from today. 6. Continue supportive measures.   Leanna Battles. Dixon Boos [facility] [phone] 8/9/201810:48 AM       LOS: 3 days

## 2017-06-03 NOTE — Progress Notes (Signed)
PROGRESS NOTE    Russell Trujillo  YQI:[id number] DOB: 11-20-1960 DOA: 06/21/2017 PCP: Dettinger, Fransisca Kaufmann, MD    Brief Narrative:  Patient is Russell Trujillo with Russell history of alcohol-induced cirrhosis, portal hypertensive gastropathy, grade 1 esophageal there are SCDs, gastritis/duodenitis, ongoing alcohol abuse, chronic CHF, status post AICD implantation, recent ventral hernia repair, and Russell vesicorectal fistula. He presented to the ED on 06/25/2017 with increasing shortness of breath, lower extremity swelling, and increased abdominal swelling. In the ED, he was afebrile with blood pressures in the 110s to 100s. Satting in the upper 90s on room air. Notable labs include Russell white count of 11, hemoglobin 8.4, platelets of 145, sodium of 129, potassium 2.5, calcium 8.3, albumin 2.1, T bili of 14.9 ethanol level of 44, fecal occult blood positive, and INR 2.0. Noncontrasted head CT revealed no acute intracranial abnormalities. Chest x-ray revealed pulmonary vascular congestion and moderate right pleural effusion. He was admitted for further evaluation and management.   Assessment & Plan:   Principal Problem:   Acute respiratory failure (HCC) Active Problems:   Vesicorectal fistula   Alcoholic cirrhosis of liver with ascites (HCC)   Acute lower UTI   Alcoholic hepatitis without ascites   Pleural effusion   Essential hypertension, benign   AICD (automatic cardioverter/defibrillator) present   Ventral hernia   AKI (acute kidney injury) ([place])   Anemia in other chronic diseases classified elsewhere   Hyponatremia   Esophageal varices without bleeding (HCC)   Hypokalemia   Hypomagnesemia   Alcohol abuse   GI bleed   Coagulopathy (HCC)   Thrombocytopenia (HCC)   Hyperglycemia   Palliative care encounter   Goals of care, counseling/discussion   DNR (do not resuscitate) discussion   Patient is Russell Trujillo with decompensated alcohol-induced cirrhosis who was admitted with acute  respiratory failure with hypoxia in the setting of anasarca and right pleural effusion. He is status post thoracentesis which yielded 1.4 L of fluid. He is on Zosyn for UTI. Gastroenterology was consulted and is following. Due to his poor prognosis, palliative care consult was ordered. Palliative care NP, [name] discussed CODE STATUS with the patient's brother and he was in agreement for a DO NOT RESUSCITATE status. The dictating physician and gastroenterology are in agreement that the patient should be Russell DNR as his prognosis is very poor. [name] met with his family as outlined in her note. Appreciate her input and management recommendations. In agreement. Comfort care would be the best management for this patient. Patient is now mostly obtunded, likely from progressive end-stage cirrhosis.  -We will continue treating what's treatable. -Hold oral medications due to obtundation. -Potassium IV runs were ordered due to hypokalemia. -Continue supportive treatment with oxygen as needed and bronchodilators for wheezes as needed. -Continue antibiotics for Russell total of 7 days or less if he is transitioned completely to comfort care.   DVT prophylaxis: INR equal to or greater than 2; SCDs Code Status: Now DO NOT RESUSCITATE as of 06/02/17. Family Communication: Family not available Disposition Plan: Discharge when clinically appropriate   Consultants:   Palliative care  Gastroenterology  Procedures:   Ultrasound-guided thoracentesis 06/01/2017 with 1400 mL of amber colored pleural fluid removed.  1 unit packed red blood cells 05/26/2017  Antimicrobials:  IV Zosyn 06/15/2017  IV vancomycin 06/08/2017   Subjective: Patient is obtunded.  Objective: Vitals:   06/03/17 0600 06/03/17 0801 06/03/17 0944 06/03/17 1401  BP:  114/60  125/85  Pulse:  73  79  Resp:  (!) 23  (!) 21  Temp:  97.6 F (36.4 C)  97.8 F (36.6 C)  TempSrc:  Axillary  Axillary  SpO2:  97% 93% 96%  Weight: 128.1  kg (282 lb 6.6 oz)     Height:        Intake/Output Summary (Last 24 hours) at 06/03/17 1456 Last data filed at 06/03/17 1300  Gross per 24 hour  Intake                0 ml  Output                0 ml  Net                0 ml   Filed Weights   06/14/2017 2114 06/02/17 0413 06/03/17 0600  Weight: 128.7 kg (283 lb 12.8 oz) 127.6 kg (281 lb 4.9 oz) 128.1 kg (282 lb 6.6 oz)    Examination:  General exam: Appears calm and comfortable; lethargic/obtunded. Respiratory system: Globally decreased breath sounds with breakthrough fine wheezes. Respiratory effort normal. Cardiovascular system: S1 & S2 heard, RRR. No JVD, murmurs, rubs, gallops or clicks. 2+ bilateral lower extremity edema. Gastrointestinal system: Abdomen is obese, large, mildly distended. nondistended, soft and nontender. Organomegaly or masses difficult to assess due to the obesity. Normal bowel sounds heard. Well-healed vertical abdominal scar. Central nervous system: Obtunded Extremities: No acute hot red joints. Skin: Mild erythematous pretibial areas with Russell few excoriations Psychiatry: Judgement and insight appear impaired. The patient is lethargic secondary to Ativan.    Data Reviewed: I have personally reviewed following labs and imaging studies  CBC:  Recent Labs Lab 06/25/2017 1611 06/01/17 0424 06/02/17 0538 06/02/17 0956 06/03/17 0454  WBC 11.1* 10.6* 14.7* PATIENT IDENTIFICATION ERROR. PLEASE DISREGARD RESULTS. ACCOUNT WILL BE CREDITED. 11.9*  NEUTROABS 8.6*  --  11.5*  --   --   HGB 8.4* 8.7* 8.3* PATIENT IDENTIFICATION ERROR. PLEASE DISREGARD RESULTS. ACCOUNT WILL BE CREDITED. 9.2*  HCT 23.8* 25.7* 24.5* PATIENT IDENTIFICATION ERROR. PLEASE DISREGARD RESULTS. ACCOUNT WILL BE CREDITED. 27.3*  MCV 94.8 93.5 94.6 PATIENT IDENTIFICATION ERROR. PLEASE DISREGARD RESULTS. ACCOUNT WILL BE CREDITED. 95.4  PLT 145* 140* 123* PATIENT IDENTIFICATION ERROR. PLEASE DISREGARD RESULTS. ACCOUNT WILL BE CREDITED. 132*    Basic Metabolic Panel:  Recent Labs Lab 06/14/2017 1611 06/01/17 0424 06/01/17 2300 06/02/17 0538 06/02/17 0956 06/03/17 0454  NA 129* 132* 130* 132* PATIENT IDENTIFICATION ERROR. PLEASE DISREGARD RESULTS. ACCOUNT WILL BE CREDITED. 135  K 2.5* 2.7* 3.2* 3.5 PATIENT IDENTIFICATION ERROR. PLEASE DISREGARD RESULTS. ACCOUNT WILL BE CREDITED. 3.0*  CL 93* 95* 95* 96* PATIENT IDENTIFICATION ERROR. PLEASE DISREGARD RESULTS. ACCOUNT WILL BE CREDITED. 98*  CO2 22 26 21* 25 PATIENT IDENTIFICATION ERROR. PLEASE DISREGARD RESULTS. ACCOUNT WILL BE CREDITED. 26  GLUCOSE 87 86 103* 108* PATIENT IDENTIFICATION ERROR. PLEASE DISREGARD RESULTS. ACCOUNT WILL BE CREDITED. 84  BUN 9 8 11 12  PATIENT IDENTIFICATION ERROR. PLEASE DISREGARD RESULTS. ACCOUNT WILL BE CREDITED. 16  CREATININE 1.28* 1.19 1.76* 1.79* PATIENT IDENTIFICATION ERROR. PLEASE DISREGARD RESULTS. ACCOUNT WILL BE CREDITED. 1.65*  CALCIUM 8.3* 8.0* 7.6* 7.6* PATIENT IDENTIFICATION ERROR. PLEASE DISREGARD RESULTS. ACCOUNT WILL BE CREDITED. 7.7*  MG 1.0* 1.1* 1.5*  --   --  1.9   GFR: Estimated Creatinine Clearance: 65.3 mL/min (Russell) (by C-G formula based on SCr of 1.65 mg/dL (H)). Liver Function Tests:  Recent Labs Lab 06/24/2017 1611 06/01/17 0424 06/02/17 0538 06/02/17 0956 06/03/17 0454  AST 121*  115* 117* PATIENT IDENTIFICATION ERROR. PLEASE DISREGARD RESULTS. ACCOUNT WILL BE CREDITED. 119*  ALT 34 34 31 PATIENT IDENTIFICATION ERROR. PLEASE DISREGARD RESULTS. ACCOUNT WILL BE CREDITED. 34  ALKPHOS 138* 127* 102 PATIENT IDENTIFICATION ERROR. PLEASE DISREGARD RESULTS. ACCOUNT WILL BE CREDITED. 113  BILITOT 14.9* 14.9* 15.0* PATIENT IDENTIFICATION ERROR. PLEASE DISREGARD RESULTS. ACCOUNT WILL BE CREDITED. 14.8*  PROT 7.9 7.7 7.0 PATIENT IDENTIFICATION ERROR. PLEASE DISREGARD RESULTS. ACCOUNT WILL BE CREDITED. 7.3  ALBUMIN 2.1* 2.0* 2.0* PATIENT IDENTIFICATION ERROR. PLEASE DISREGARD RESULTS. ACCOUNT WILL BE CREDITED. 2.0*    Recent  Labs Lab 06/02/17 0610 06/03/17 0454  LIPASE 42 48    Recent Labs Lab 06/23/2017 1617 06/01/17 2300 06/02/17 0538 06/03/17 0454  AMMONIA 55* 83* 57* 75*   Coagulation Profile:  Recent Labs Lab 06/08/2017 1611 06/01/17 0424 06/02/17 0538 06/03/17 0454  INR 2.01 2.00 2.40 1.95   Cardiac Enzymes:  Recent Labs Lab 06/05/2017 1611  TROPONINI <0.03   BNP (last 3 results) No results for input(s): PROBNP in the last 8760 hours. HbA1C: No results for input(s): HGBA1C in the last 72 hours. CBG:  Recent Labs Lab 06/15/2017 2117  GLUCAP 115*   Lipid Profile: No results for input(s): CHOL, HDL, LDLCALC, TRIG, CHOLHDL, LDLDIRECT in the last 72 hours. Thyroid Function Tests: No results for input(s): TSH, T4TOTAL, FREET4, T3FREE, THYROIDAB in the last 72 hours. Anemia Panel: No results for input(s): VITAMINB12, FOLATE, FERRITIN, TIBC, IRON, RETICCTPCT in the last 72 hours. Sepsis Labs: No results for input(s): PROCALCITON, LATICACIDVEN in the last 168 hours.  Recent Results (from the past 240 hour(s))  Urine culture     Status: Abnormal   Collection Time: 06/07/2017  5:40 PM  Result Value Ref Range Status   Specimen Description URINE, CLEAN CATCH  Final   Special Requests NONE  Final   Culture (Russell)  Final    >=100,000 COLONIES/mL KLEBSIELLA PNEUMONIAE 50,000 COLONIES/mL ESCHERICHIA COLI    Report Status 06/03/2017 FINAL  Final   Organism ID, Bacteria KLEBSIELLA PNEUMONIAE (Russell)  Final   Organism ID, Bacteria ESCHERICHIA COLI (Russell)  Final      Susceptibility   Escherichia coli - MIC*    AMPICILLIN 8 SENSITIVE Sensitive     CEFAZOLIN <=4 SENSITIVE Sensitive     CEFTRIAXONE <=1 SENSITIVE Sensitive     CIPROFLOXACIN <=0.25 SENSITIVE Sensitive     GENTAMICIN <=1 SENSITIVE Sensitive     IMIPENEM <=0.25 SENSITIVE Sensitive     NITROFURANTOIN <=16 SENSITIVE Sensitive     TRIMETH/SULFA <=20 SENSITIVE Sensitive     AMPICILLIN/SULBACTAM 4 SENSITIVE Sensitive     PIP/TAZO <=4  SENSITIVE Sensitive     Extended ESBL NEGATIVE Sensitive     * 50,000 COLONIES/mL ESCHERICHIA COLI   Klebsiella pneumoniae - MIC*    AMPICILLIN RESISTANT Resistant     CEFAZOLIN <=4 SENSITIVE Sensitive     CEFTRIAXONE <=1 SENSITIVE Sensitive     CIPROFLOXACIN <=0.25 SENSITIVE Sensitive     GENTAMICIN <=1 SENSITIVE Sensitive     IMIPENEM <=0.25 SENSITIVE Sensitive     NITROFURANTOIN <=16 SENSITIVE Sensitive     TRIMETH/SULFA <=20 SENSITIVE Sensitive     AMPICILLIN/SULBACTAM <=2 SENSITIVE Sensitive     PIP/TAZO <=4 SENSITIVE Sensitive     Extended ESBL NEGATIVE Sensitive     * >=100,000 COLONIES/mL KLEBSIELLA PNEUMONIAE  Gram stain     Status: None   Collection Time: 06/01/17  9:19 AM  Result Value Ref Range Status   Specimen Description PLEURAL  Final  Special Requests NONE  Final   Gram Stain   Final    CYTOSPIN SMEAR NO ORGANISMS SEEN WBC SEEN WBC PRESENT,BOTH PMN AND MONONUCLEAR    Report Status 06/01/2017 FINAL  Final  Culture, body fluid-bottle     Status: None (Preliminary result)   Collection Time: 06/01/17  9:19 AM  Result Value Ref Range Status   Specimen Description PLEURAL COLLECTED BY DOCTOR  Final   Special Requests BOTTLES DRAWN AEROBIC AND ANAEROBIC 10 CC EACH  Final   Culture NO GROWTH 2 DAYS  Final   Report Status PENDING  Incomplete  Culture, blood (Routine X 2) w Reflex to ID Panel     Status: None (Preliminary result)   Collection Time: 06/01/17 12:21 PM  Result Value Ref Range Status   Specimen Description BLOOD RIGHT HAND  Final   Special Requests   Final    BOTTLES DRAWN AEROBIC ONLY Blood Culture adequate volume   Culture NO GROWTH 2 DAYS  Final   Report Status PENDING  Incomplete  Culture, blood (Routine X 2) w Reflex to ID Panel     Status: None (Preliminary result)   Collection Time: 06/01/17 12:44 PM  Result Value Ref Range Status   Specimen Description BLOOD LEFT ARM  Final   Special Requests   Final    BOTTLES DRAWN AEROBIC AND ANAEROBIC  Blood Culture adequate volume   Culture NO GROWTH 2 DAYS  Final   Report Status PENDING  Incomplete         Radiology Studies: Dg Chest Port 1 View  Result Date: 06/02/2017 CLINICAL DATA:  Shortness of Breath EXAM: PORTABLE CHEST 1 VIEW COMPARISON:  06/01/2017 FINDINGS: Cardiac shadow remains enlarged. Russell defibrillator is again seen stable. Increasing right basilar infiltrate is noted. Vascular congestion is increased as well. No sizable effusion is seen. No bony abnormality is noted. IMPRESSION: Increase in central vascular congestion and right basilar infiltrative density. Electronically Signed   By: Inez Catalina M.D.   On: 06/02/2017 12:41        Scheduled Meds: . fenofibrate  160 mg Oral Daily  . FLUoxetine  10 mg Oral Daily  . folic acid  1 mg Oral Daily  . furosemide  40 mg Oral BID WC  . ipratropium-albuterol  3 mL Nebulization BID  . lactulose  30 g Oral TID  . LORazepam  0-4 mg Intravenous Q12H  . multivitamin with minerals  1 tablet Oral Daily  . nicotine  14 mg Transdermal Daily  . pantoprazole (PROTONIX) IV  40 mg Intravenous Q12H  . rifaximin  550 mg Oral BID  . spironolactone  100 mg Oral BID WC  . thiamine  100 mg Oral Daily   Or  . thiamine  100 mg Intravenous Daily   Continuous Infusions: . piperacillin-tazobactam (ZOSYN)  IV Stopped (06/03/17 1038)  . potassium chloride       LOS: 3 days    Time spent: 25 minutes    Rexene Alberts, MD [facility] Pager [phone]  If 7PM-7AM, please contact night-coverage [email or web address] Password [email or web address] 06/03/2017, 2:56 PM

## 2017-06-03 NOTE — Progress Notes (Signed)
Daily Progress Note   Patient Name: Russell Trujillo       Date: 06/03/2017 DOB: Nov 27, 1960  Age: 56 y.o. MRN#: [id number] Attending Physician: Elliot Cousin, MD Primary Care Physician: Dettinger, Elige Radon, MD Admit Date: 06/24/2017  Reason for Consultation/Follow-up: Establishing goals of care, Psychosocial/spiritual support and Withdrawal of life-sustaining treatment  Subjective: Russell Trujillo is lying quietly in bed. His eyes flutter as I rubbed his sternum, but he is unable to communicate even his most basic needs. He has sitter at bedside.  I returned later in the day for scheduled family meeting. Russell Trujillo looks the same. He again will only flutter his eyes to my sternal rub. Present today at bedside is Brother Sherrill Raring and his wife Junious Dresser. We go to my office for a family meeting.  We talk at length about Russell Trujillo chronic health history including his heart problems, anxiety and depression, his alcoholism and associated problems including cirrhosis, esophageal varices. We talk about Russell Trujillo home life. Brother Sherrill Raring states that Russell Trujillo moved in with his mother 2 years ago in January. He states that his wife left him 4 years ago, he has lost his home and his automobile. 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 Duane states that their sister died 17 years ago, their father died 16 years ago, and their mother died with liver cancer 01/11/2023 of this year. She spent the last week of her life in the hospice home, but had decline hospice prior to that, she was receiving home health services. Both Duane and his wife Junious Dresser state that they see the same changes in Russell Trujillo. Duane  states that the share of came to serve divorce papers for Russell Trujillo a few weeks ago and he really took it hard. Family shares that Russell Trujillo was drinking all day every day, his preferred drink was vodka. Duane shares that Russell Trujillo has been drinking for 40+ years. He states that he was discharged honorably from the [employer] at age 33 for "alcoholism".  Duane states that he knew his brother was ill, but "he didn't want to come to the hospital". Duane goes further to state that if his brother had had  a gun he would've killed himself after their mother died. We review labs in detail, I share x-ray report and images. We talk about the treatment plan in detail. We also talk about pathophysiology related to these chronic illnesses. We talk about fluid shifting (anasarca), we talk about esophageal varices, we talk about albumin for healing, we talk about low blood (anemia of chronic disease) and one unit of PRBC administered. We talk about DTs and the use of Ativan. Brother Duane states, "He was in bad shape, but he didn't want to do anything".  We talk about Russell Trujillo inability to wake up and take food, liquids, or medications by mouth. We talk about the need for lactulose, and that this can sometimes be given per rectum. We talk about dignity when medication is administered this way.  I share that, today, G.I. is not recommending the use of PR lactulose. Family states that their goal, "if he can pull through" was for him to go to an ALF. They shared that they knew that this was not possible as he did not qualify based on age, and he had been denied his disability claim several times. I share, "things look different now".  We talk about the concepts of "let nature take its course", to focus on comfort and dignity. I share with Duane that we will always continue to care for Russell Trujillo. With permission, we talk about prognosis. I share my worry that even under full scope of treatment Russell Trujillo has weeks left to  live.  Brother Duane at this time request full NO code status. No CPR, no intubation, no BiPAP, no vasopressors.   We plan for me to call Duane tomorrow at 1300. At this point family is requesting 24 hours for outcomes. They state if he is "the same, or worse" they will possibly choose comfort care.  Length of Stay: 3  Current Medications: Scheduled Meds:  . fenofibrate  160 mg Oral Daily  . FLUoxetine  10 mg Oral Daily  . folic acid  1 mg Oral Daily  . furosemide  40 mg Oral BID WC  . ipratropium-albuterol  3 mL Nebulization BID  . lactulose  30 g Oral TID  . LORazepam  0-4 mg Intravenous Q12H  . multivitamin with minerals  1 tablet Oral Daily  . nicotine  14 mg Transdermal Daily  . pantoprazole (PROTONIX) IV  40 mg Intravenous Q12H  . rifaximin  550 mg Oral BID  . spironolactone  100 mg Oral BID WC  . thiamine  100 mg Oral Daily   Or  . thiamine  100 mg Intravenous Daily    Continuous Infusions: . piperacillin-tazobactam (ZOSYN)  IV Stopped (06/03/17 1038)    PRN Meds: albuterol, LORazepam **OR** LORazepam  Physical Exam  HENT:  Head: Atraumatic.  Cardiovascular: Normal rate.   Pulmonary/Chest:  Periods of apnea, accessory muscle use  Abdominal: Soft. He exhibits distension.  Musculoskeletal: He exhibits edema.  Neurological:  Lethargic, eyes flutter to sternal rub  Skin: Skin is warm and dry.  Jaundiced, anasarca  Nursing note and vitals reviewed.           Vital Signs: BP 125/85 (BP Location: Left Arm)   Pulse 79   Temp 97.8 F (36.6 C) (Axillary)   Resp (!) 21   Ht 5\' 8"  (1.727 m)   Wt 128.1 kg (282 lb 6.6 oz)   SpO2 96%   BMI 42.94 kg/m  SpO2: SpO2: 96 % O2 Device: O2 Device: Nasal  Cannula O2 Flow Rate: O2 Flow Rate (L/min): 4 L/min  Intake/output summary:  Intake/Output Summary (Last 24 hours) at 06/03/17 1447 Last data filed at 06/03/17 1300  Gross per 24 hour  Intake                0 ml  Output                0 ml  Net                0 ml    LBM: Last BM Date: 06/02/17 Baseline Weight: Weight: 122 kg (269 lb) Most recent weight: Weight: 128.1 kg (282 lb 6.6 oz)       Palliative Assessment/Data:    Flowsheet Rows     Most Recent Value  Intake Tab  Referral Department  Gastroenterology  Unit at Time of Referral  Med/Surg Unit  Date Notified  06/01/17  Palliative Care Type  New Palliative care  Reason for referral  Clarify Goals of Care, Advance Care Planning  Date of Admission  06-27-2017  Date first seen by Palliative Care  06/02/17  # of days Palliative referral response time  1 Day(s)  # of days IP prior to Palliative referral  1  Clinical Assessment  Palliative Performance Scale Score  20%  Pain Max last 24 hours  Not able to report  Pain Min Last 24 hours  Not able to report  Dyspnea Max Last 24 Hours  Not able to report  Dyspnea Min Last 24 hours  Not able to report  Psychosocial & Spiritual Assessment  Palliative Care Outcomes  Patient/Family meeting held?  Yes  Who was at the meeting?  Brother Duane via phone  Palliative Care Outcomes  Counseled regarding hospice, Provided advance care planning, Changed CPR status, Provided psychosocial or spiritual support, Clarified goals of care  Patient/Family wishes: Interventions discontinued/not started   Mechanical Ventilation      Patient Active Problem List   Diagnosis Date Noted  . Palliative care encounter   . Goals of care, counseling/discussion   . DNR (do not resuscitate) discussion   . Respiratory distress 06/02/2017  . Coagulopathy (HCC) 06/02/2017  . Thrombocytopenia (HCC) 06/02/2017  . Hyperglycemia 06/02/2017  . Pleural effusion 06/02/2017  . Alcoholic hepatitis without ascites   . Acute lower UTI 06/01/2017  . Hypomagnesemia 06/01/2017  . Alcohol abuse 06/01/2017  . GI bleed 06/01/2017  . Anemia   . Cirrhosis (HCC)   . Decompensated liver disease (HCC)   . Acute respiratory failure (HCC) June 27, 2017  . Jaundice Jun 27, 2017  . Alcoholic  cirrhosis of liver with ascites (HCC) June 27, 2017  . Anasarca 06/27/2017  . Hypokalemia 06-27-17  . Esophageal varices without bleeding (HCC)   . AKI (acute kidney injury) (HCC) 09/24/2016  . Elevated troponin 09/24/2016  . Anemia in other chronic diseases classified elsewhere 09/24/2016  . Avascular necrosis of bone of right hip (HCC) 09/24/2016  . Hyponatremia 09/24/2016  . Edema of left lower extremity 03/02/2016  . Hepatic cirrhosis (HCC) 01/28/2016  . 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 Essential hypertension, benign 12/27/2015  . Hyperlipidemia LDL goal <130 12/27/2015  . AICD (automatic cardioverter/defibrillator) present 12/27/2015  . Ventral hernia 12/27/2015    Palliative Care Assessment & Plan   Patient Profile: 56 y.o. male  with past medical history of Alcoholic cirrhosis of the liver, anxiety and depression, esophageal varices, CHF with AICD, aseptic necrosis of right hip bone, history of ventral hernia repair December 2017 admitted on 2017-06-27  with acute respiratory failure, alcoholic cirrhosis.   Assessment: Decompensated cirrhosis; MELD score of 30, lethargic and unable to take PO medications. Mental status changes likely related to acute disease, head CT on admission shows no acute process. Suspected DTs requiring regular dosing of IV Ativan.   Recommendations/Plan:  At this point family is requesting 24 hours for outcomes. They state if he is "the same, or worse" they will likely choose comfort care.  Goals of Care and Additional Recommendations:  Limitations on Scope of Treatment: No CPR, no intubation, no BiPAP, no vasopressors  Code Status:    Code Status Orders        Start     Ordered   06/02/17 1240  Do not attempt resuscitation (DNR)  Continuous    Question Answer Comment  In the event of cardiac or respiratory ARREST Do not call a "code blue"   In the event of cardiac or respiratory ARREST Do not perform Intubation, CPR, defibrillation or ACLS     In the event of cardiac or respiratory ARREST Use medication by any route, position, wound care, and other measures to relive pain and suffering. May use oxygen, suction and manual treatment of airway obstruction as needed for comfort.      06/02/17 1240    Code Status History    Date Active Date Inactive Code Status Order ID Comments User Context   06/19/2017  6:28 PM 06/02/2017 12:40 PM Full Code [id number]  Floydene FlockNewton, Steven J, MD ED   09/24/2016  8:18 PM 09/29/2016  3:19 PM Full Code [id number]  Bobette Mortiz, David Manuel, MD Inpatient       Prognosis:   < 2 weeks even with supportive measures would not be surprising based on chronic illness history, albumin of 2.0, frailty, functional decline  Discharge Planning:  To be determined, family states that their goal previously had been ALF (they know he would not qualify due to age and no disability status). Things look different now, they are considering transfer to the hospice home for comfort and dignity at end-of-life.  Care plan was discussed with nursing staff, case manager, social worker, and Dr. Sherrie MustacheFisher.  Thank you for allowing the Palliative Medicine Team to assist in the care of this patient.   Time In: 1300 Time Out: 1440 Total Time 100 minutes Prolonged Time Billed  yes       Greater than 50%  of this time was spent counseling and coordinating care related to the above assessment and plan.  Katheran Aweasha A Dove, NP  Please contact Palliative Medicine Team phone at [phone] for questions and concerns.

## 2017-06-03 NOTE — Progress Notes (Signed)
Pharmacy Antibiotic Note  Russell BlonderMarty M Trujillo is a 56 y.o. male admitted on 06/11/2017 with sepsis.  Pharmacy has been consulted for Vancomycin and Zosyn dosing. Vanc trough today 34 mcg/ml  Plan: Hold vancomycin and recheck random level in 24 hours Continue zosyn as ordered  Monitor labs, micro and vitals.   Height: 5\' 8"  (172.7 cm) Weight: 282 lb 6.6 oz (128.1 kg) IBW/kg (Calculated) : 68.4  Temp (24hrs), Avg:98.1 F (36.7 C), Min:97.6 F (36.4 C), Max:98.5 F (36.9 C)   Recent Labs Lab 2017-08-15 1611 06/01/17 0424 06/01/17 2300 06/02/17 0538 06/02/17 0956 06/03/17 0454 06/03/17 1215  WBC 11.1* 10.6*  --  14.7* PATIENT IDENTIFICATION ERROR. PLEASE DISREGARD RESULTS. ACCOUNT WILL BE CREDITED. 11.9*  --   CREATININE 1.28* 1.19 1.76* 1.79* PATIENT IDENTIFICATION ERROR. PLEASE DISREGARD RESULTS. ACCOUNT WILL BE CREDITED. 1.65*  --   VANCOTROUGH  --   --   --   --   --   --  34*    Estimated Creatinine Clearance: 65.3 mL/min (A) (by C-G formula based on SCr of 1.65 mg/dL (H)).    Allergies  Allergen Reactions  . Coreg [Carvedilol] Swelling    Swelling of lips   Antimicrobials this admission: Vanc 8/6 >>  Zosyn 8/6 >>   Dose adjustments this admission: 8/9  Hold vanc due to elevated trough  Microbiology results: 8/7  Pleural fluid:  8/6 UCx: Klebsiella and E coli 8/7  BC>> ngtd   Thank you for allowing pharmacy to be a part of this patient's care.  Russell Trujillo, Russell Trujillo 06/03/2017 1:27 PM

## 2017-06-03 NOTE — Progress Notes (Signed)
Pt remains unable to take PO meds due to mental state, agitation level previously increased, dose of ativan has calmed pt somewhat. Pt continues to wheeze at level of 7p shift assessment on 4 L [place], sats between 93-95%.Will continue to monitor.

## 2017-06-03 NOTE — Progress Notes (Signed)
CRITICAL VALUE ALERT  Critical Value:  Vanc Trough 34  Date & Time Notied:  06/03/2017 34  Provider Notified: Sherrie MustacheFisher, MD  Orders Received/Actions taken: awaiting further instructions

## 2017-06-03 NOTE — Progress Notes (Signed)
Am assessment patient was found to be very somnolent, hard to arouse ,patient only moaned out a little during the sternum rub, B/P 114/60,hr 73, oxygen sat 97% on 4 liters via nasal canula. Dr Sherrie MustacheFisher notified. Will continue to monitor patient.

## 2017-06-03 NOTE — Progress Notes (Signed)
Pt has remained somewhat agitated and combative throughout night, agitation is decreased with ativan. Sitter has been discontinued for remainder of shift, pt currently resting in bed. Will continue to monitor.

## 2017-06-04 DIAGNOSIS — Z9889 Other specified postprocedural states: Secondary | ICD-10-CM

## 2017-06-04 DIAGNOSIS — D696 Thrombocytopenia, unspecified: Secondary | ICD-10-CM

## 2017-06-04 DIAGNOSIS — N321 Vesicointestinal fistula: Secondary | ICD-10-CM

## 2017-06-04 LAB — BLOOD CULTURE ID PANEL (REFLEXED)
ACINETOBACTER BAUMANNII: NOT DETECTED
CANDIDA ALBICANS: NOT DETECTED
CANDIDA GLABRATA: DETECTED — AB
CANDIDA TROPICALIS: NOT DETECTED
Candida krusei: NOT DETECTED
Candida parapsilosis: NOT DETECTED
Carbapenem resistance: NOT DETECTED
ENTEROBACTER CLOACAE COMPLEX: NOT DETECTED
ENTEROCOCCUS SPECIES: NOT DETECTED
ESCHERICHIA COLI: NOT DETECTED
Enterobacteriaceae species: NOT DETECTED
HAEMOPHILUS INFLUENZAE: NOT DETECTED
Klebsiella oxytoca: NOT DETECTED
Klebsiella pneumoniae: NOT DETECTED
LISTERIA MONOCYTOGENES: NOT DETECTED
METHICILLIN RESISTANCE: NOT DETECTED
NEISSERIA MENINGITIDIS: NOT DETECTED
Proteus species: NOT DETECTED
Pseudomonas aeruginosa: NOT DETECTED
STREPTOCOCCUS PNEUMONIAE: NOT DETECTED
STREPTOCOCCUS PYOGENES: NOT DETECTED
STREPTOCOCCUS SPECIES: NOT DETECTED
Serratia marcescens: NOT DETECTED
Staphylococcus aureus (BCID): NOT DETECTED
Staphylococcus species: NOT DETECTED
Streptococcus agalactiae: NOT DETECTED
VANCOMYCIN RESISTANCE: NOT DETECTED

## 2017-06-04 LAB — BASIC METABOLIC PANEL
Anion gap: 11 (ref 5–15)
BUN: 17 mg/dL (ref 6–20)
CO2: 25 mmol/L (ref 22–32)
Calcium: 7.6 mg/dL — ABNORMAL LOW (ref 8.9–10.3)
Chloride: 99 mmol/L — ABNORMAL LOW (ref 101–111)
Creatinine, Ser: 1.23 mg/dL (ref 0.61–1.24)
GFR calc Af Amer: >60 mL/min (ref >60)
GFR calc non Af Amer: >60 mL/min (ref >60)
Glucose, Bld: 59 mg/dL — ABNORMAL LOW (ref 65–99)
Potassium: 3.8 mmol/L (ref 3.5–5.1)
Sodium: 135 mmol/L (ref 135–145)

## 2017-06-04 LAB — CBC
HCT: 28.9 % — ABNORMAL LOW (ref 39.0–52.0)
Hemoglobin: 9.7 g/dL — ABNORMAL LOW (ref 13.0–17.0)
MCH: 32.4 pg (ref 26.0–34.0)
MCHC: 33.6 g/dL (ref 30.0–36.0)
MCV: 96.7 fL (ref 78.0–100.0)
Platelets: 166 10*3/uL (ref 150–400)
RBC: 2.99 MIL/uL — ABNORMAL LOW (ref 4.22–5.81)
RDW: 26.1 % — ABNORMAL HIGH (ref 11.5–15.5)
WBC: 11.2 10*3/uL — ABNORMAL HIGH (ref 4.0–10.5)

## 2017-06-04 LAB — GLUCOSE, CAPILLARY: Glucose-Capillary: 74 mg/dL (ref 65–99)

## 2017-06-04 LAB — AMMONIA: Ammonia: 70 umol/L — ABNORMAL HIGH (ref 9–35)

## 2017-06-04 MED ORDER — ORAL CARE MOUTH RINSE: 15.0000 mL | Freq: Two times a day (BID) | OROMUCOSAL | Status: DC

## 2017-06-04 MED ORDER — SODIUM CHLORIDE 0.9 % IV SOLN
100.0000 mg | INTRAVENOUS | Status: DC
  Filled 2017-06-04 (×3): qty 100

## 2017-06-04 MED ORDER — CHLORHEXIDINE GLUCONATE 0.12 % MT SOLN: 15.0000 mL | Freq: Two times a day (BID) | OROMUCOSAL | Status: DC

## 2017-06-04 MED ORDER — ANIDULAFUNGIN 100 MG IV SOLR
200.0000 mg | Freq: Once | INTRAVENOUS | Status: DC
  Filled 2017-06-04: qty 200

## 2017-06-06 LAB — CULTURE, BLOOD (ROUTINE X 2)
CULTURE: NO GROWTH
SPECIAL REQUESTS: ADEQUATE
Special Requests: ADEQUATE

## 2017-06-06 LAB — CULTURE, BODY FLUID W GRAM STAIN -BOTTLE: Culture: NO GROWTH

## 2017-06-08 LAB — TYPE AND SCREEN
ABO/RH(D): A POS
Antibody Screen: NEGATIVE
UNIT DIVISION: 0
UNIT DIVISION: 0
Unit division: 0

## 2017-06-08 LAB — BPAM RBC
BLOOD PRODUCT EXPIRATION DATE: 201808302359
Blood Product Expiration Date: 201808302359
Blood Product Expiration Date: 201808302359
ISSUE DATE / TIME: 201808061959
UNIT TYPE AND RH: 6200
Unit Type and Rh: 6200
Unit Type and Rh: 6200

## 2017-06-26 NOTE — Progress Notes (Signed)
      INFECTIOUS DISEASE ATTENDING ADDENDUM:   Date: 06/17/2017  Patient name: Russell BlonderMarty M Hilscher  Medical record number: [id number]  Date of birth: 09/29/1961   PATIENT HAS YEAST IN BLOOD I AM ATTEMPTING TO ADD ERAXIS BUT BLOCKED FROM DOING SO AT PRESENT   PLEASE DO SO  REPEAT BLOOD CUTLURES TOMORROW  WOULD TRY TO ALSO GET FORMAL OPTHO CONSULT DURING ADMISSION  WOULD LIKE FOR PRIMARY TO PAGE ME AT [phone] TO DISCUSS Paulette BlanchCornelius Van Dam 06/03/2017, 9:59 AM

## 2017-06-26 NOTE — Discharge Summary (Signed)
Death Summary  Russell Trujillo QIO:[id number] DOB: 10-16-1961 DOA: 23-Jun-2017  PCP: Dettinger, Elige Radon, MD   Admit date: 2017/06/23 Date of Death: 2017-06-27  Final Diagnoses:  Primary Cause of Death: - End-stage decompensated alcoholic liver cirrhosis. Active problems addressed during the hospitalization:   Acute respiratory failure (HCC)   Vesicorectal fistula   Alcoholic cirrhosis of liver with ascites (HCC)   Acute lower UTI   Alcoholic hepatitis without ascites   Pleural effusion   Essential hypertension, benign   AICD (automatic cardioverter/defibrillator) present   Ventral hernia   AKI (acute kidney injury) (HCC)   Anemia in other chronic diseases classified elsewhere   Hyponatremia   Esophageal varices without bleeding (HCC)   Hypokalemia   Hypomagnesemia   Alcohol abuse with alcohol withdrawal delirium.   GI bleed   Coagulopathy (HCC)   Thrombocytopenia (HCC)   Hyperglycemia   Palliative care encounter   Goals of care, counseling/discussion   DNR (do not resuscitate) discussion   Morbid obesity   Candidal bacteremia   Diagnosis of sepsis was determined at the time of discharge due to the blood cultures becoming positive; following the patient's death/post mortem.     History of present illness:  Patient was a 56 year old man with a history of alcohol-induced cirrhosis, portal hypertensive gastropathy, grade 1 esophageal there are SCDs, gastritis/duodenitis, ongoing alcohol abuse, chronic CHF, status post AICD implantation, recent ventral hernia repair, and a vesicorectal fistula. He presented to the ED on 06-23-17 with increasing shortness of breath, lower extremity swelling, and increased abdominal swelling. In the ED, he was afebrile with blood pressures in the 110s to 100s. Satting in the upper 90s on room air. Notable labs include a white count of 11, hemoglobin 8.4, platelets of 145, sodium of 129, potassium 2.5, calcium 8.3, albumin 2.1, T bili of 14.9 ethanol  level of 44, fecal occult blood positive, and INR 2.0.His urinalysis was positive for infection. Noncontrasted head CT revealed no acute intracranial abnormalities. Chest x-ray revealed pulmonary vascular congestion and moderate right pleural effusion. He was admitted for further evaluation and management.   Hospital Course:  Patient was a 56 year old man with decompensated alcohol-induced cirrhosis who was admitted with acute respiratory failure with hypoxia in the setting of anasarca and right pleural effusion. Thoracentesis was ordered which yielded1.4 L of fluid. He was also transfused 1 unit of packed red blood cells for support and worsening anemia. Blood cultures and a urine culture were ordered on admission.  Zosyn was started for treatment of the UTI. Vancomycin was added to broaden treatment for possible sepsis. Gastroenterology was consulted and followed the patient; management recommendations were given and followed. His chronic medications for hepatic cirrhosis were continued including xifaxan, lactulose, spironolactone, Lasix, and Protonix.  His MELD was 30 and DF was 50. He was not a candidate for a liver transplant with continued alcohol use. His prognosis was deemed to be very poor. The patient became agitated, consistent with alcohol withdrawal syndrome. He was started on the CIWA protocol with Ativan and vitamin therapy. Due to his poor prognosis and worsening mental status/encephalopathy,, palliative care was consulted. Palliative care NP, [name] discussed CODE STATUS with the patient's brother and he was in agreement for a DO NOT RESUSCITATE status. Please see [name]'s notes. The dictating physician and gastroenterology team were in agreement that the patient should be a DNR as his prognosis became grave. The move toward complete comfort care was going to be discussed with the family by either Ms.  Dove or the dictating physician. The patient deteriorated clinically. He became mostly  unresponsive and obtunded. His ammonia level was in the 70s, not extremely elevated. He could not or would not take any medications by mouth, so the oral medications were withheld. Lactulose enemas were considered, but they were not given due to the patient's frequent loose stools secondary to the previous oral lactulose given.  The patient's urine culture grew out Klebsiella pneumoniae and Escherichia coli, both sensitive to Zosyn. Vancomycin was discontinued. His blood cultures remained negative throughout the hospitalization until a result became apparent after he expired. Postmortem, it was noted that his blood cultures had grown out Candida glabrata. Of note, the patient was treated appropriately for the urinary tract infection with Klebsiella. A candidal bacteremia was not clinically apparent during the hospitalization.  He continued to deteriorate clinically. He expired on the morning of 06/23/2017 in no acute distress. Patient's primary cause of death was decompensated/end-stage alcoholic induced cirrhosis causing many of his metabolic and prognostic derangements and sepsis.     Time: 30 minutes.  Signed:  Yezenia Fredrick  [facility] 06/13/2017, 10:50 AM

## 2017-06-26 NOTE — Progress Notes (Signed)
Time of death 0815 after asystole on telemetry, no respirations, no apical pulse. Pt.'s brother notified, states "I don't won't to come see him like that. Go ahead and call the funeral home." Donation of anatomical Gift notified - reports family declined donation. Joey with [facility] called to report no further action needs to done with ICD. Pt. Transported to morgue.

## 2017-06-26 NOTE — Progress Notes (Addendum)
At approximately 0715, Patient's respirations stopped when this RN and Verlon AuLeslie, RN were in patient's room. Apical auscultated at first. Tele monitor was on and still showing paced rhythm. No palpable pulse at approximately 0720. This RN, Arther DamesLeslie,RN, and Psychologist, sport and exerciseMorgan Charge RN to witness. Tisha, RN assisting to apply magnet with no success of asystole reading. Dr. Sherrie MustacheFisher notified.

## 2017-06-26 DEATH — deceased

## 2017-07-06 ENCOUNTER — Telehealth: Payer: Self-pay | Admitting: Gastroenterology

## 2017-07-06 NOTE — Telephone Encounter (Signed)
REVIEWED-NO ADDITIONAL RECOMMENDATIONS. 

## 2017-07-06 NOTE — Telephone Encounter (Signed)
Noted. I remember this from his hospitalization in Aug 2018. Thanks for update though.

## 2017-07-06 NOTE — Telephone Encounter (Signed)
Patient is deceased.

## 2017-07-06 NOTE — Telephone Encounter (Signed)
FYI to AB also.

## 2017-07-28 ENCOUNTER — Ambulatory Visit: Payer: Medicaid Other | Admitting: Gastroenterology

## 2018-03-20 IMAGING — CT CT HEAD W/O CM
3 series · 16 of 47 positions shown, 19 images · non-contrast
Comparison: None

CLINICAL DATA: Increased shortness of breath and abdominal swelling
for 1 week, altered level of consciousness, hypertension, CHF,
cirrhosis, smoker

EXAM:
CT HEAD WITHOUT CONTRAST
TECHNIQUE: Contiguous axial images were obtained from the base of the skull
through the vertex without intravenous contrast. Sagittal and
coronal MPR images reconstructed from axial data set.

[Series 2: head trauma wo · axial · 0.46mm/px · z∈[+354,+489]mm · 10 of 33 slices shown, 13 images]
[im 3/33  brain]
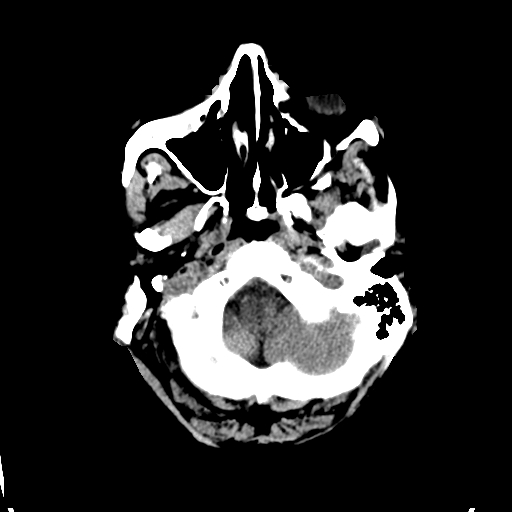
[im 3/33  bone]
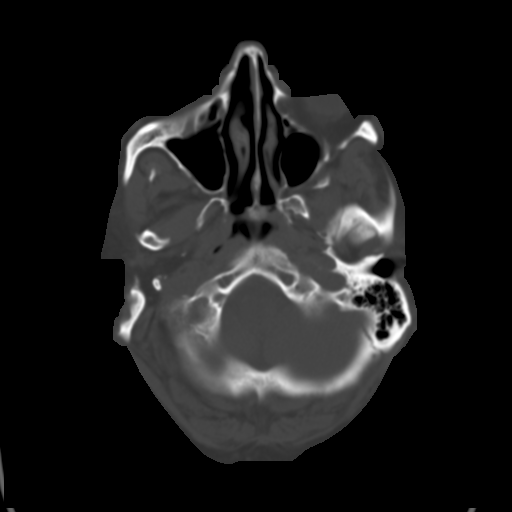
[im 6/33  brain]
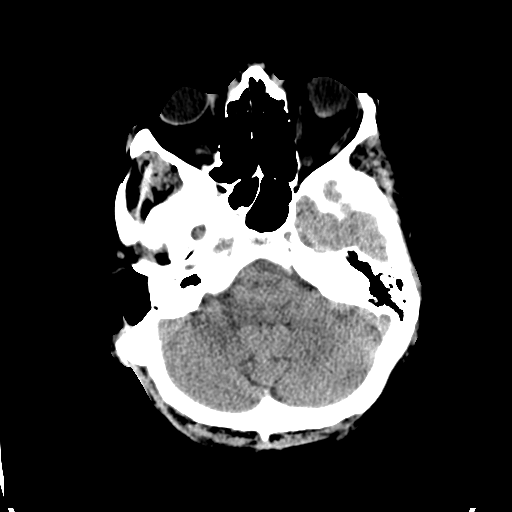
[im 9/33  brain]
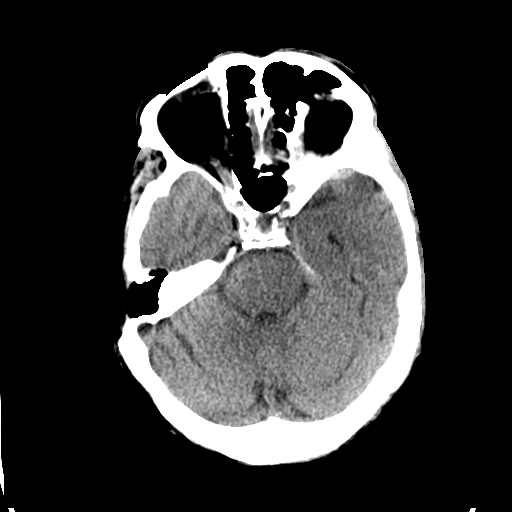
[im 12/33  brain]
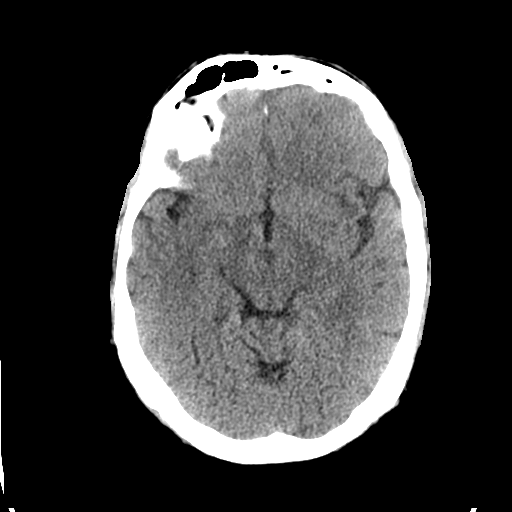
[im 15/33  brain]
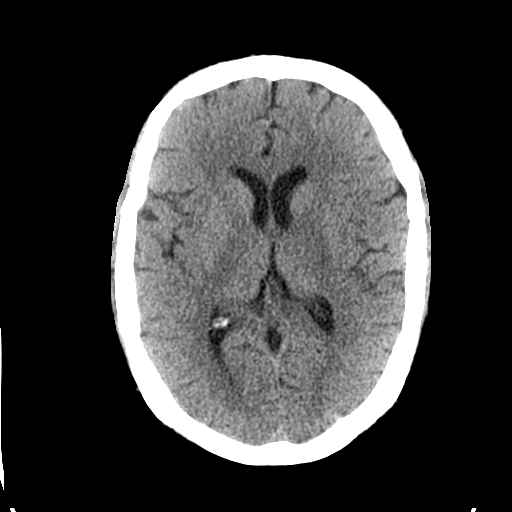
[im 15/33  bone]
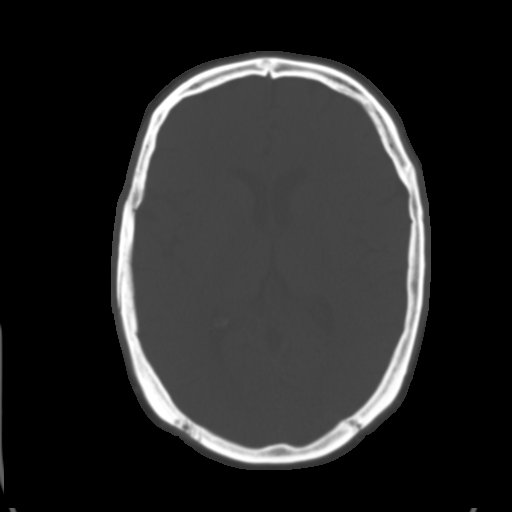
[im 18/33  brain]
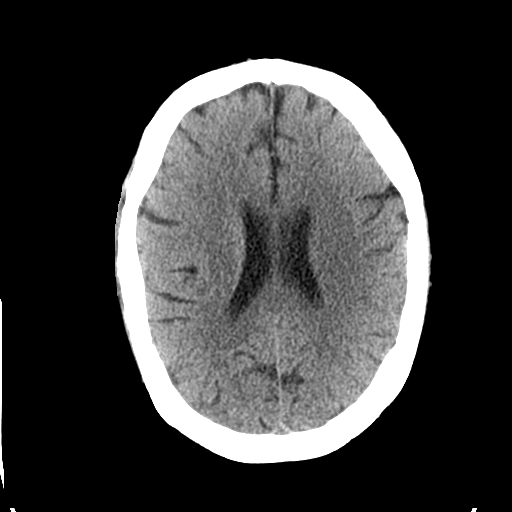
[im 21/33  brain]
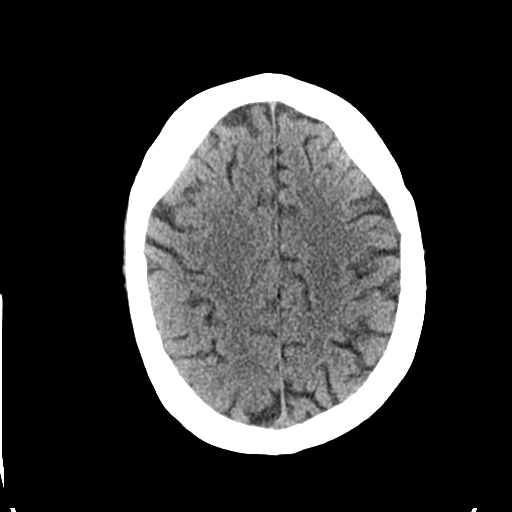
[im 25/33  brain]
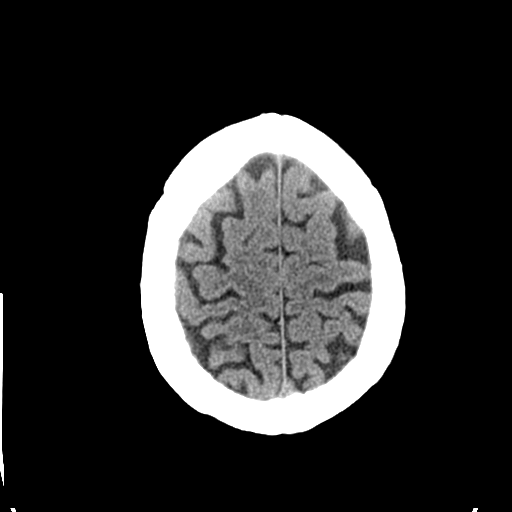
[im 27/33  brain]
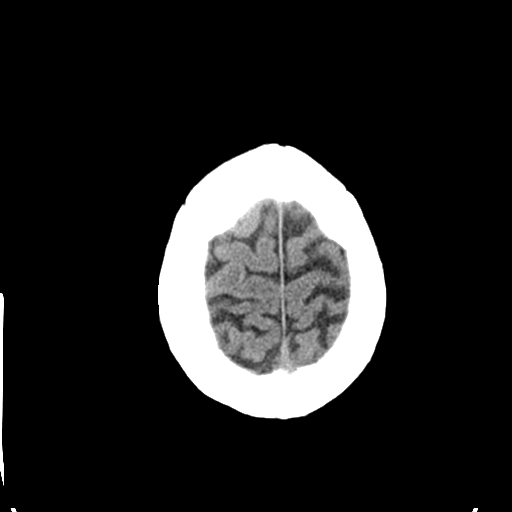
[im 27/33  bone]
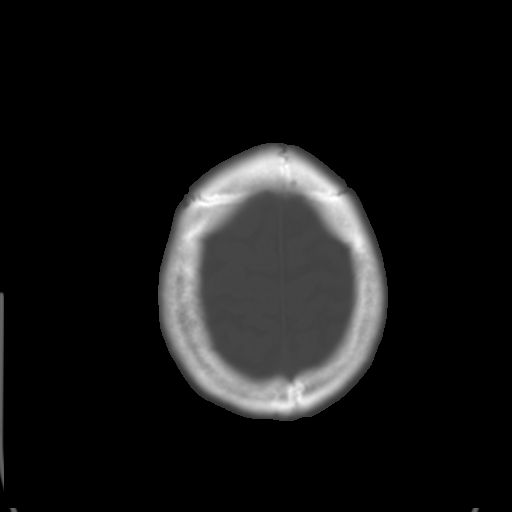
[im 30/33  brain]
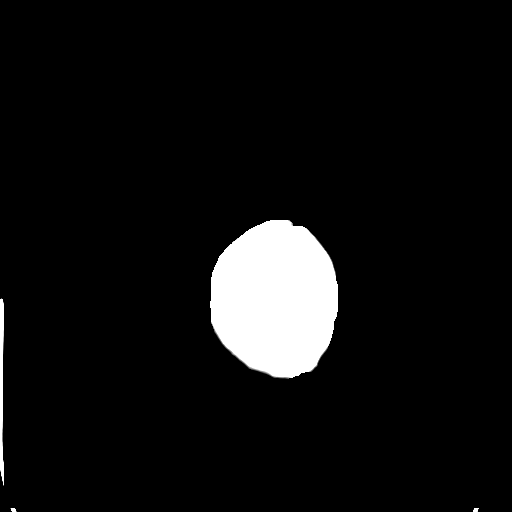

[Series 4: coronal soft tissue · coronal · 0.32mm/px · 3 of 67 slices shown]
[im 23/67  brain]
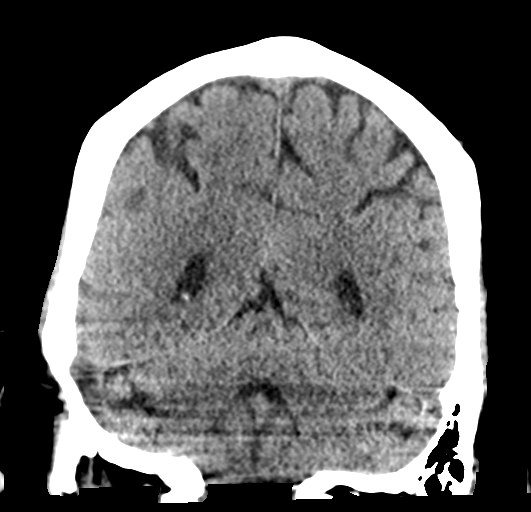
[im 30/67  brain]
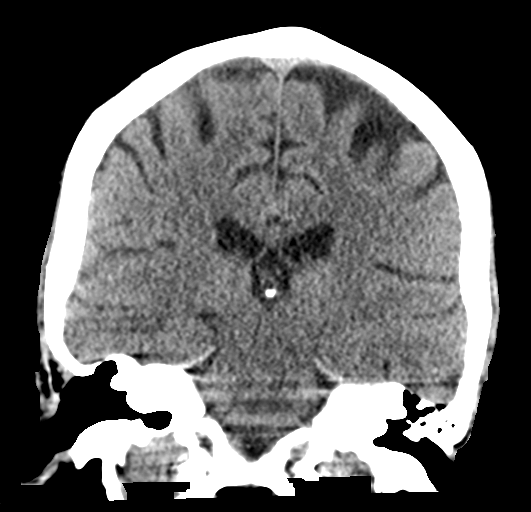
[im 37/67  brain]
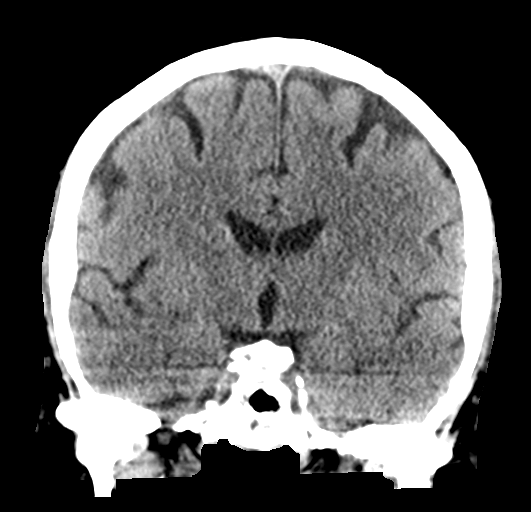

[Series 5: sagittal soft tissue · sagittal · 0.35mm/px · 3 of 55 slices shown]
[im 19/55  brain]
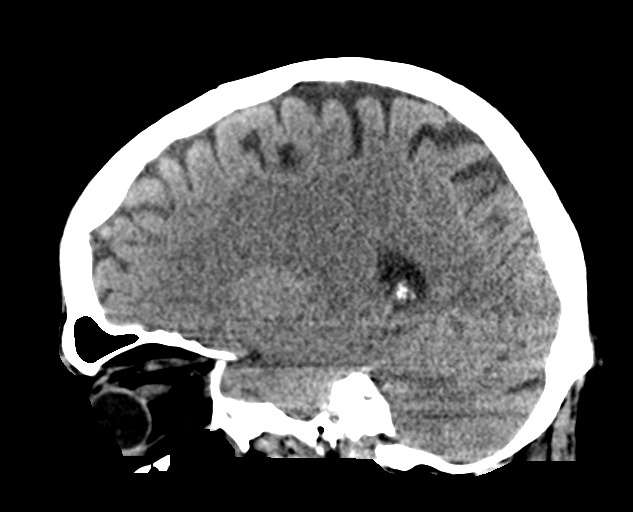
[im 28/55  brain]
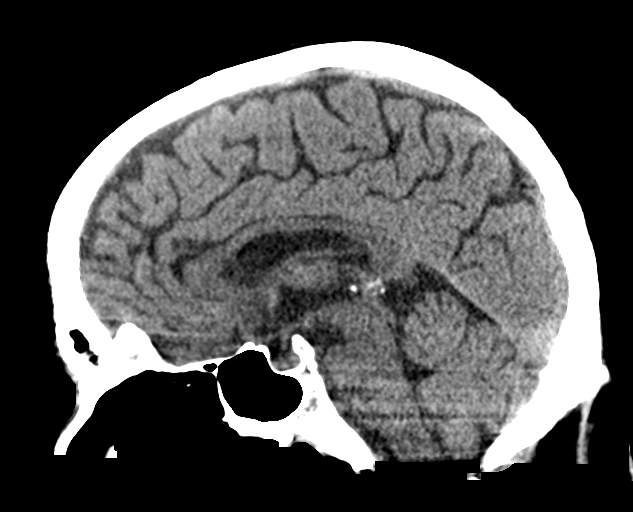
[im 37/55  brain]
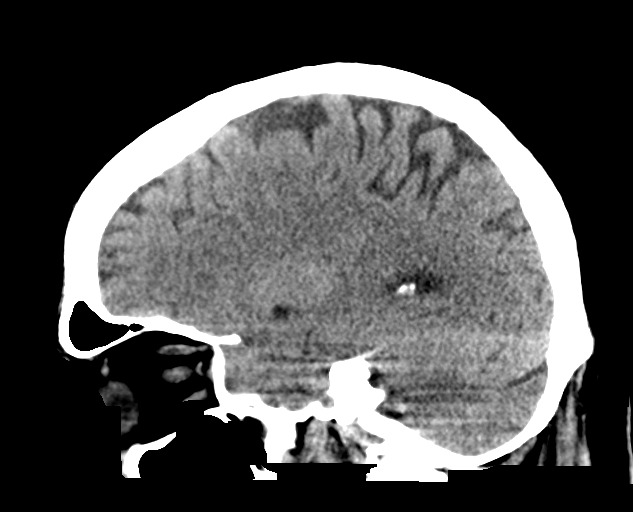

[16 of 47 positions shown; findings below may reference images not displayed]

FINDINGS: Brain: Minimal atrophy. Normal ventricular morphology. No midline
shift or mass effect. Otherwise normal appearance of brain
parenchyma. No intracranial hemorrhage, mass lesion or evidence
acute infarction. No extra-axial fluid collections.

Vascular: Atherosclerotic calcifications at the internal carotid
arteries at the skullbase

Skull: Intact

Sinuses/Orbits: Clear paranasal sinuses and LEFT mastoid air cells.
Prior RIGHT mastoidectomy.

Other: N/A
IMPRESSION: No acute intracranial abnormalities.

Prominent atherosclerotic calcifications at the internal carotid
arteries bilaterally at skullbase for age.

## 2018-03-21 IMAGING — US US THORACENTESIS ASP PLEURAL SPACE W/IMG GUIDE
2 series · 6 of 6 positions shown · non-contrast
Comparison: none

INDICATION: Alcoholic cirrhosis, shortness of breath, RIGHT pleural effusion

[Series 1: us thoracentesis asp pleural space w/img guide · 0.27mm/px · 2 of 2 slices shown (1 of 2)]
[im 1/2]
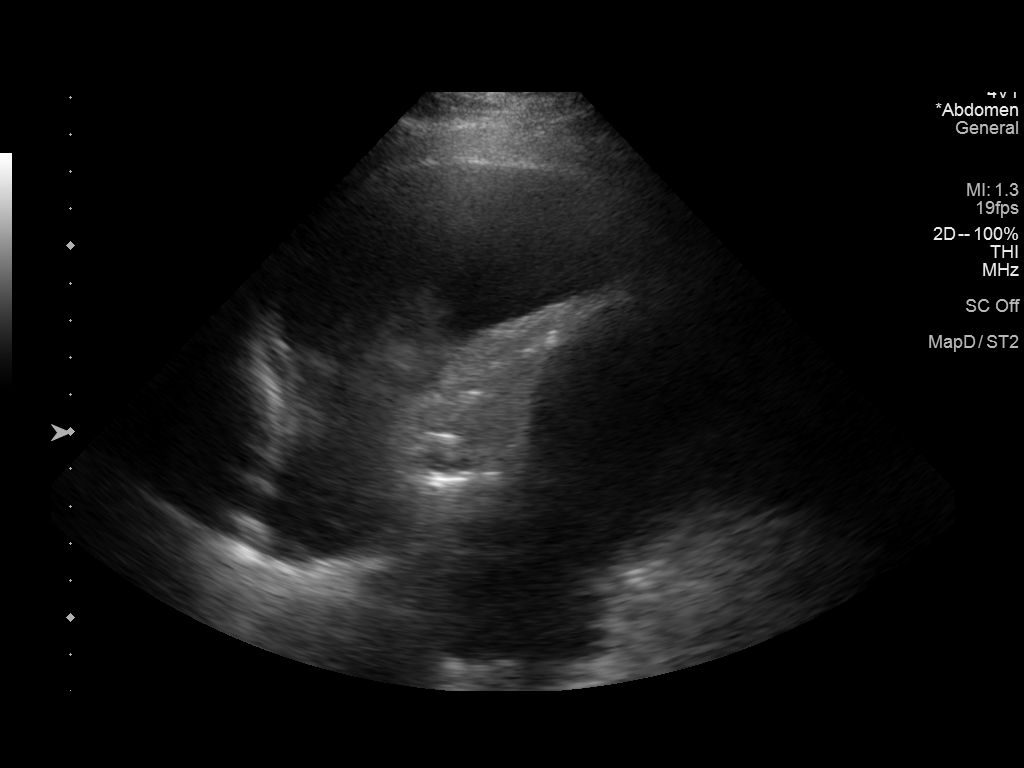
[im 2/2]
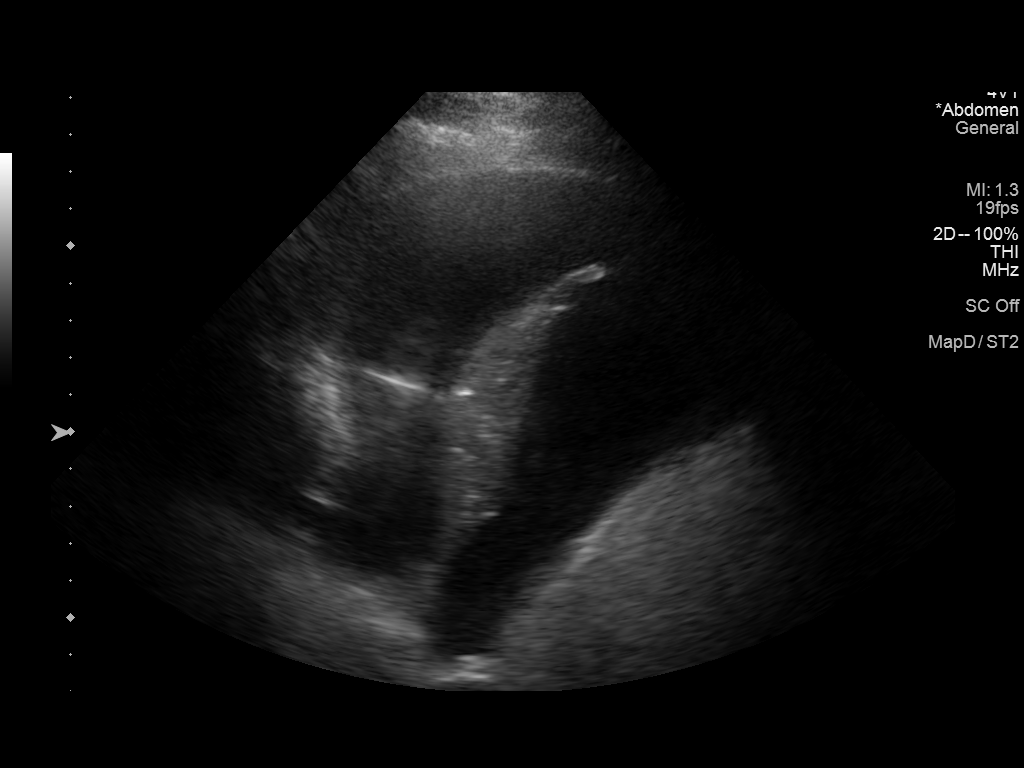

[Series 2: us thoracentesis asp pleural space w/img guide · 0.17mm/px · 4 of 4 slices shown (2 of 2)]
[im 1/4]
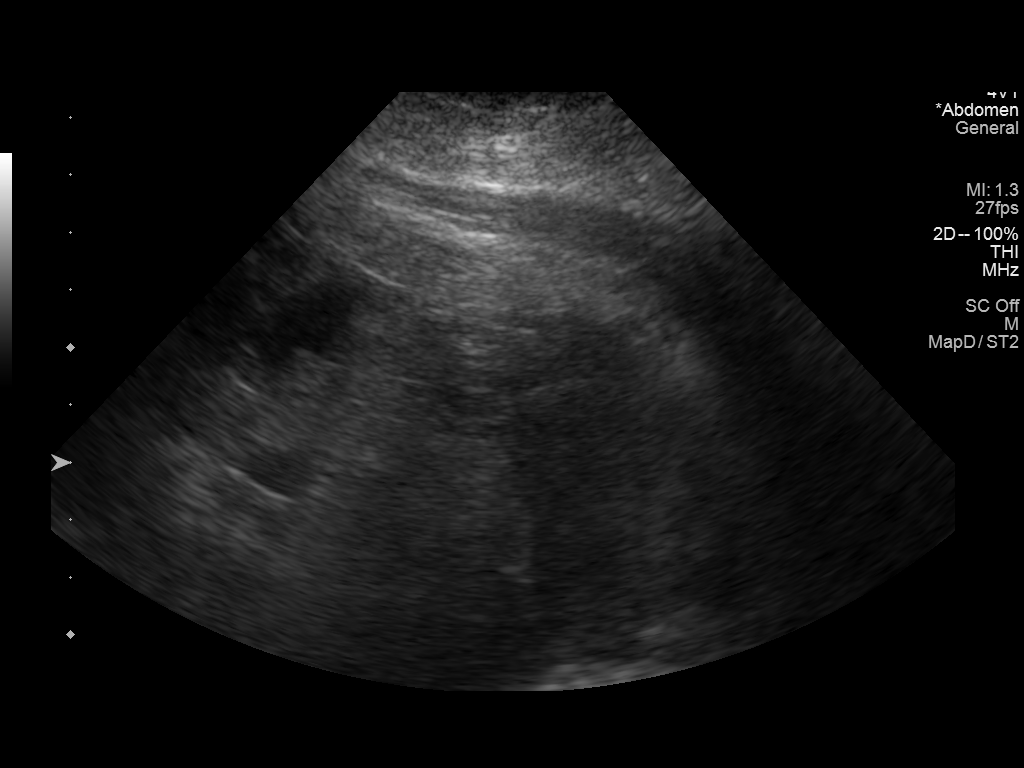
[im 2/4]
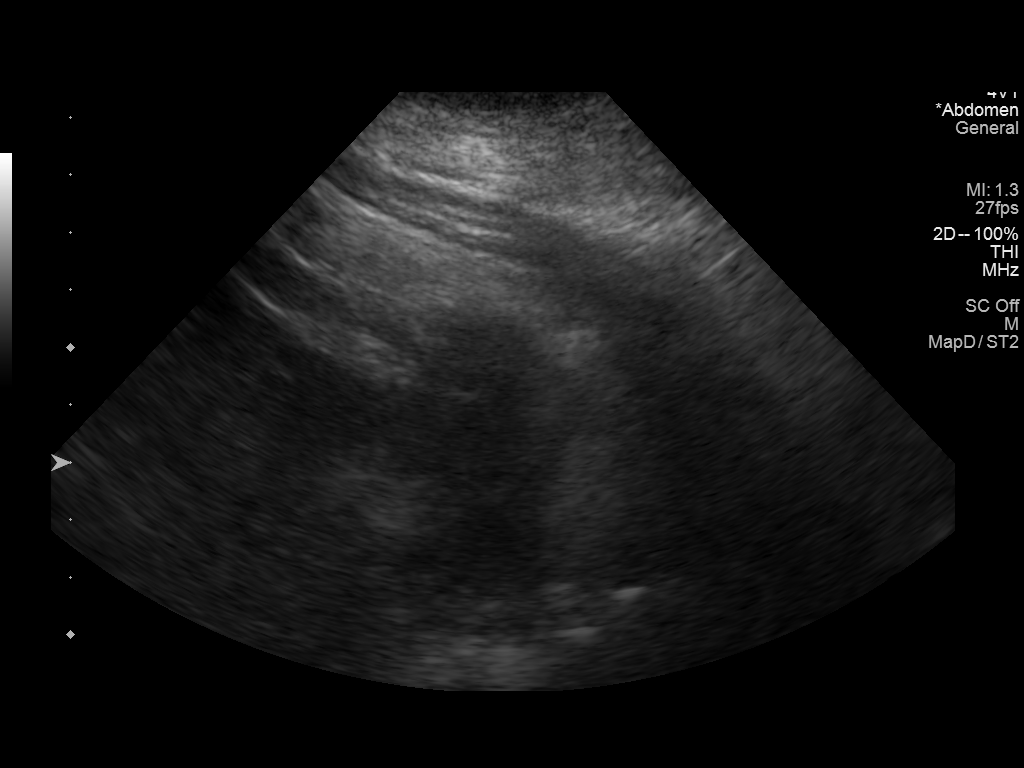
[im 3/4]
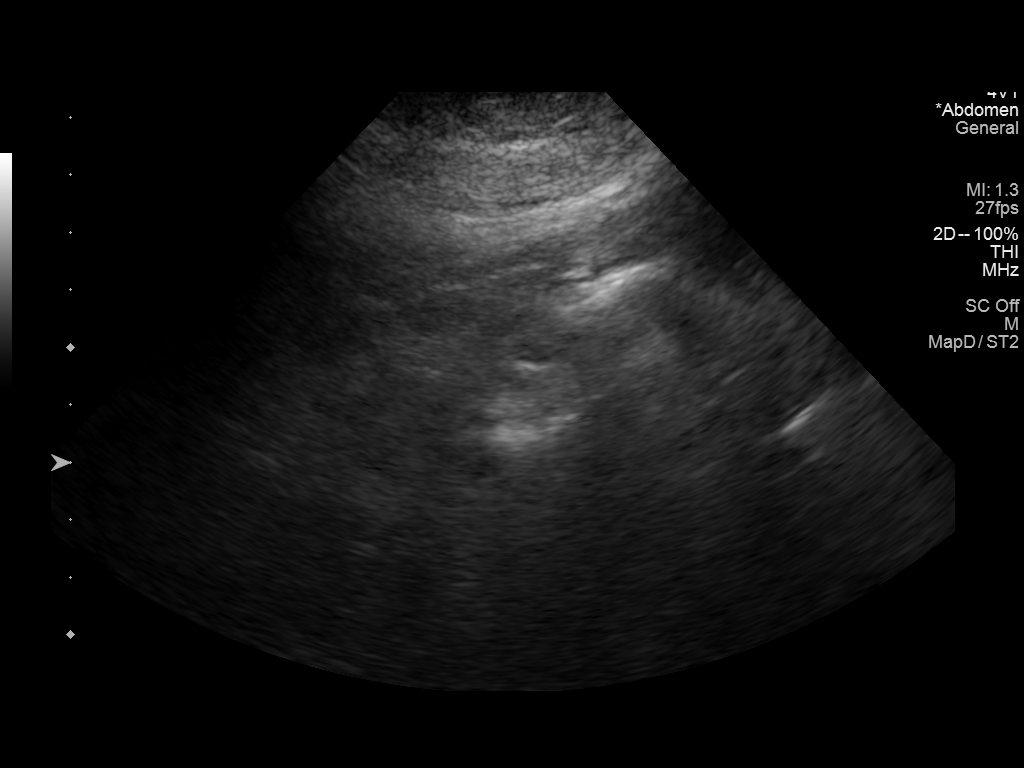
[im 4/4]
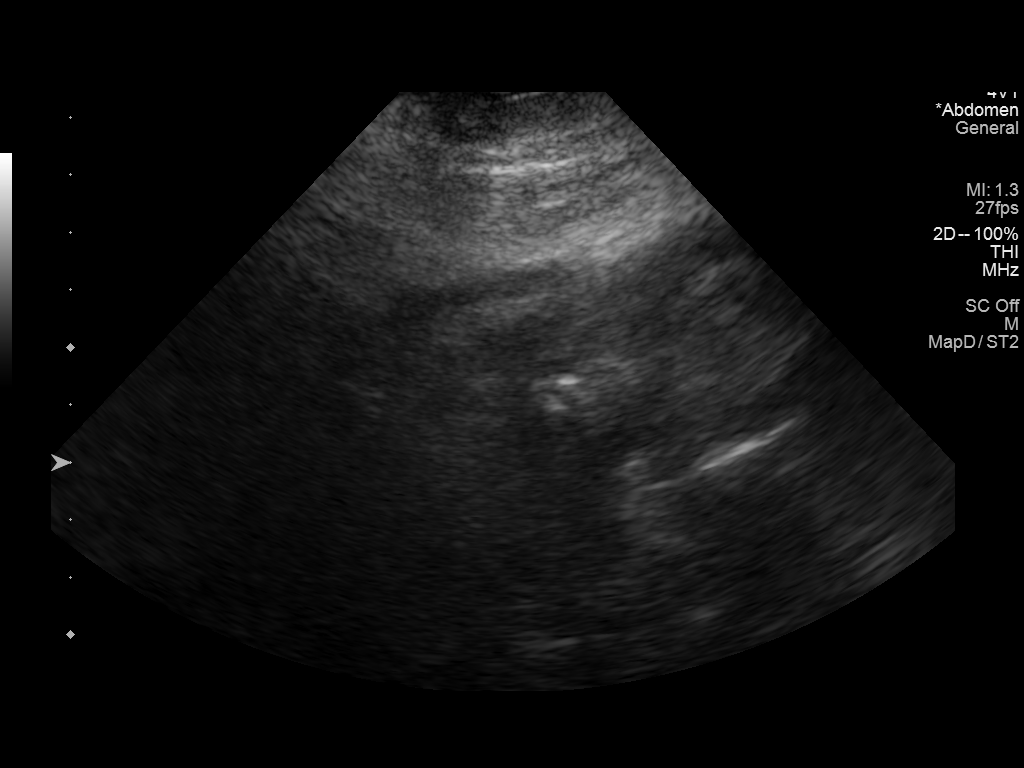

[6 of 6 positions shown; findings below may reference images not displayed]

EXAM:
ULTRASOUND GUIDED DIAGNOSTIC AND THERAPEUTIC RIGHT THORACENTESIS

MEDICATIONS:
None.

COMPLICATIONS:
None immediate.

PROCEDURE:
Procedure, benefits, and risks of procedure were discussed with
patient.

Written informed consent for procedure was obtained.

Time out protocol followed.

Pleural effusion localized by ultrasound at the posterior RIGHT
hemithorax.

Skin prepped and draped in usual sterile fashion.

Skin and soft tissues anesthetized with 10 mL of 1% lidocaine.

8 French thoracentesis catheter placed into the RIGHT pleural space.

1.4 L of bright yellow to amber colored fluid aspirated by syringe
pump.

Procedure tolerated well by patient without immediate complication.

Incidental finding: Minimal ascites perihepatic. Insufficient
ascites for either diagnostic or therapeutic paracentesis.
FINDINGS: A total of approximately 1.4 L of RIGHT pleural fluid was removed.

Samples were sent to the laboratory as requested by the clinical
team.
IMPRESSION: Successful ultrasound guided RIGHT thoracentesis yielding 1.4 L of
pleural fluid.

Insufficient ascites for paracentesis.
